# Patient Record
Sex: Male | Born: 1935 | Race: White | Hispanic: No | Marital: Married | Smoking: Former smoker
Health system: Southern US, Community
[De-identification: ages and names within clinical notes are randomized; demographics above are authoritative.]

## PROBLEM LIST (undated history)

## (undated) DIAGNOSIS — K219 Gastro-esophageal reflux disease without esophagitis: Secondary | ICD-10-CM

## (undated) DIAGNOSIS — J42 Unspecified chronic bronchitis: Secondary | ICD-10-CM

## (undated) DIAGNOSIS — E0590 Thyrotoxicosis, unspecified without thyrotoxic crisis or storm: Secondary | ICD-10-CM

## (undated) DIAGNOSIS — E119 Type 2 diabetes mellitus without complications: Secondary | ICD-10-CM

## (undated) DIAGNOSIS — I2510 Atherosclerotic heart disease of native coronary artery without angina pectoris: Secondary | ICD-10-CM

## (undated) DIAGNOSIS — K922 Gastrointestinal hemorrhage, unspecified: Secondary | ICD-10-CM

## (undated) DIAGNOSIS — N400 Enlarged prostate without lower urinary tract symptoms: Secondary | ICD-10-CM

## (undated) DIAGNOSIS — D649 Anemia, unspecified: Secondary | ICD-10-CM

## (undated) DIAGNOSIS — J209 Acute bronchitis, unspecified: Secondary | ICD-10-CM

## (undated) DIAGNOSIS — K5792 Diverticulitis of intestine, part unspecified, without perforation or abscess without bleeding: Secondary | ICD-10-CM

## (undated) DIAGNOSIS — E785 Hyperlipidemia, unspecified: Secondary | ICD-10-CM

## (undated) DIAGNOSIS — Z8611 Personal history of tuberculosis: Secondary | ICD-10-CM

## (undated) DIAGNOSIS — K5790 Diverticulosis of intestine, part unspecified, without perforation or abscess without bleeding: Secondary | ICD-10-CM

## (undated) HISTORY — PX: THYROIDECTOMY: SHX17

## (undated) HISTORY — PX: CHOLECYSTECTOMY: SHX55

## (undated) HISTORY — DX: Unspecified chronic bronchitis: J42

## (undated) HISTORY — DX: Acute bronchitis, unspecified: J20.9

## (undated) HISTORY — DX: Benign prostatic hyperplasia without lower urinary tract symptoms: N40.0

## (undated) HISTORY — DX: Hyperlipidemia, unspecified: E78.5

## (undated) HISTORY — PX: KNEE ARTHROSCOPY: SUR90

## (undated) HISTORY — DX: Personal history of tuberculosis: Z86.11

## (undated) HISTORY — DX: Atherosclerotic heart disease of native coronary artery without angina pectoris: I25.10

---

## 2004-01-07 ENCOUNTER — Emergency Department (HOSPITAL_COMMUNITY): Admission: EM | Admit: 2004-01-07 | Discharge: 2004-01-07 | Payer: Self-pay | Admitting: Emergency Medicine

## 2005-05-06 ENCOUNTER — Emergency Department (HOSPITAL_COMMUNITY): Admission: EM | Admit: 2005-05-06 | Discharge: 2005-05-06 | Payer: Self-pay | Admitting: Emergency Medicine

## 2005-08-21 ENCOUNTER — Ambulatory Visit (HOSPITAL_COMMUNITY): Admission: RE | Admit: 2005-08-21 | Discharge: 2005-08-21 | Payer: Self-pay | Admitting: Pulmonary Disease

## 2005-09-05 ENCOUNTER — Ambulatory Visit (HOSPITAL_COMMUNITY): Admission: RE | Admit: 2005-09-05 | Discharge: 2005-09-05 | Payer: Self-pay | Admitting: Pulmonary Disease

## 2005-09-12 ENCOUNTER — Ambulatory Visit (HOSPITAL_COMMUNITY): Admission: RE | Admit: 2005-09-12 | Discharge: 2005-09-12 | Payer: Self-pay | Admitting: General Surgery

## 2005-09-14 ENCOUNTER — Observation Stay (HOSPITAL_COMMUNITY): Admission: RE | Admit: 2005-09-14 | Discharge: 2005-09-28 | Payer: Self-pay | Admitting: General Surgery

## 2005-09-16 HISTORY — PX: ERCP: SHX60

## 2005-09-24 ENCOUNTER — Ambulatory Visit: Payer: Self-pay | Admitting: Internal Medicine

## 2005-11-01 ENCOUNTER — Ambulatory Visit: Payer: Self-pay | Admitting: Internal Medicine

## 2005-11-16 HISTORY — PX: ERCP: SHX60

## 2005-11-22 ENCOUNTER — Encounter (HOSPITAL_COMMUNITY): Admission: RE | Admit: 2005-11-22 | Discharge: 2005-11-22 | Payer: Self-pay | Admitting: General Surgery

## 2005-12-07 ENCOUNTER — Ambulatory Visit (HOSPITAL_COMMUNITY): Admission: RE | Admit: 2005-12-07 | Discharge: 2005-12-07 | Payer: Self-pay | Admitting: Internal Medicine

## 2005-12-07 ENCOUNTER — Ambulatory Visit: Payer: Self-pay | Admitting: Internal Medicine

## 2006-02-05 ENCOUNTER — Emergency Department (HOSPITAL_COMMUNITY): Admission: EM | Admit: 2006-02-05 | Discharge: 2006-02-05 | Payer: Self-pay | Admitting: Emergency Medicine

## 2006-05-14 ENCOUNTER — Inpatient Hospital Stay (HOSPITAL_COMMUNITY): Admission: EM | Admit: 2006-05-14 | Discharge: 2006-05-21 | Payer: Self-pay | Admitting: Emergency Medicine

## 2006-05-15 ENCOUNTER — Ambulatory Visit: Payer: Self-pay | Admitting: *Deleted

## 2006-05-17 ENCOUNTER — Ambulatory Visit: Payer: Self-pay | Admitting: *Deleted

## 2006-05-24 ENCOUNTER — Ambulatory Visit: Payer: Self-pay | Admitting: *Deleted

## 2006-05-27 ENCOUNTER — Ambulatory Visit: Payer: Self-pay | Admitting: *Deleted

## 2006-06-04 ENCOUNTER — Ambulatory Visit: Payer: Self-pay | Admitting: *Deleted

## 2006-06-06 ENCOUNTER — Ambulatory Visit: Payer: Self-pay | Admitting: Cardiology

## 2006-06-18 ENCOUNTER — Ambulatory Visit: Payer: Self-pay | Admitting: *Deleted

## 2006-07-04 ENCOUNTER — Emergency Department (HOSPITAL_COMMUNITY): Admission: EM | Admit: 2006-07-04 | Discharge: 2006-07-05 | Payer: Self-pay | Admitting: Emergency Medicine

## 2006-07-16 ENCOUNTER — Ambulatory Visit: Payer: Self-pay | Admitting: *Deleted

## 2006-08-08 ENCOUNTER — Ambulatory Visit: Payer: Self-pay | Admitting: Cardiology

## 2006-09-13 ENCOUNTER — Ambulatory Visit: Payer: Self-pay | Admitting: Internal Medicine

## 2006-10-08 ENCOUNTER — Ambulatory Visit: Payer: Self-pay | Admitting: *Deleted

## 2006-10-08 IMAGING — RF DG ERCP WO/W SPHINCTEROTOMY
1 series · 9 of 9 positions shown · non-contrast
Comparison: 09/26/2005 and 09/21/2005.   Nuclear medicine hepatobiliary scan
dated 11/22/2005.

CLINICAL DATA: Status post biliary stent placement for a post cholecystectomy
biliary leak.

ERCP WITH SPHINCTEROTOMY:

[Series 1: run · 6 acquisitions, 9 frames shown]
[im 1/6]
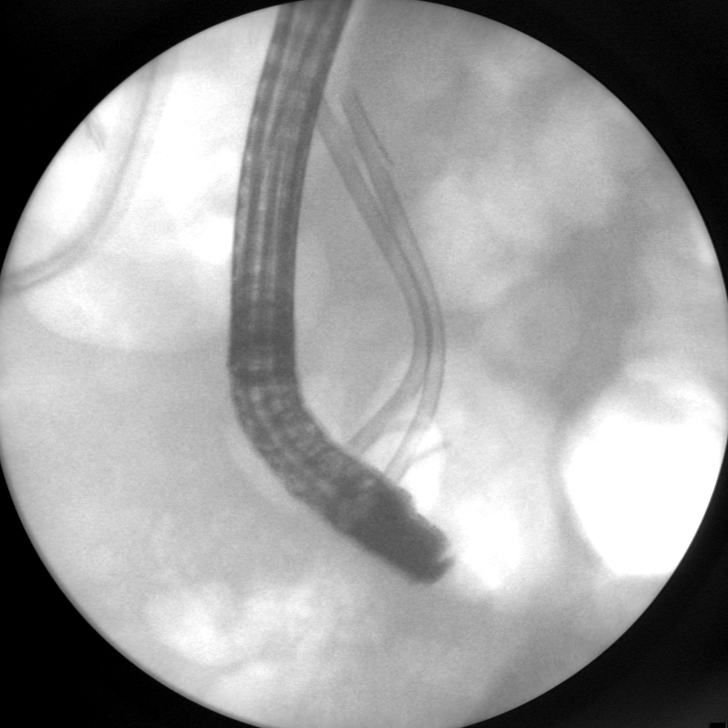
[im 2/6]
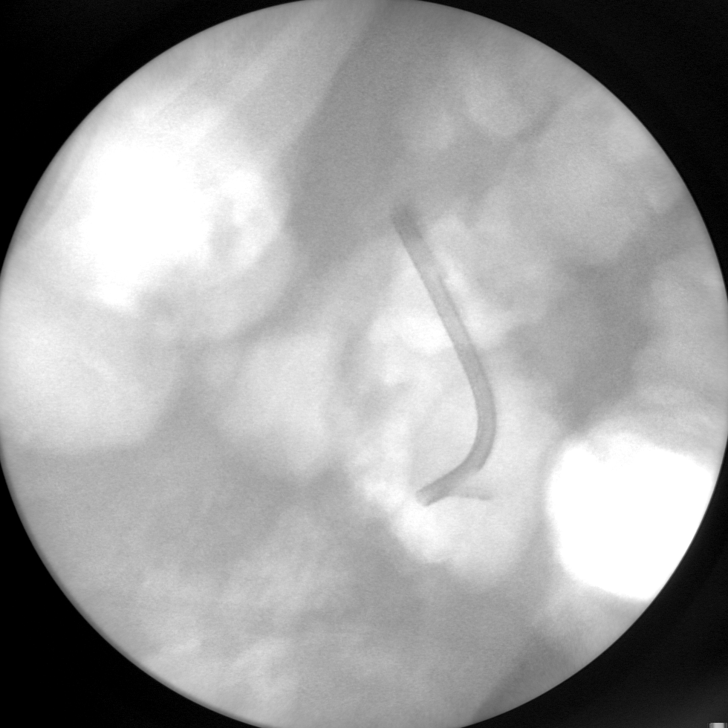
[im 3/6]
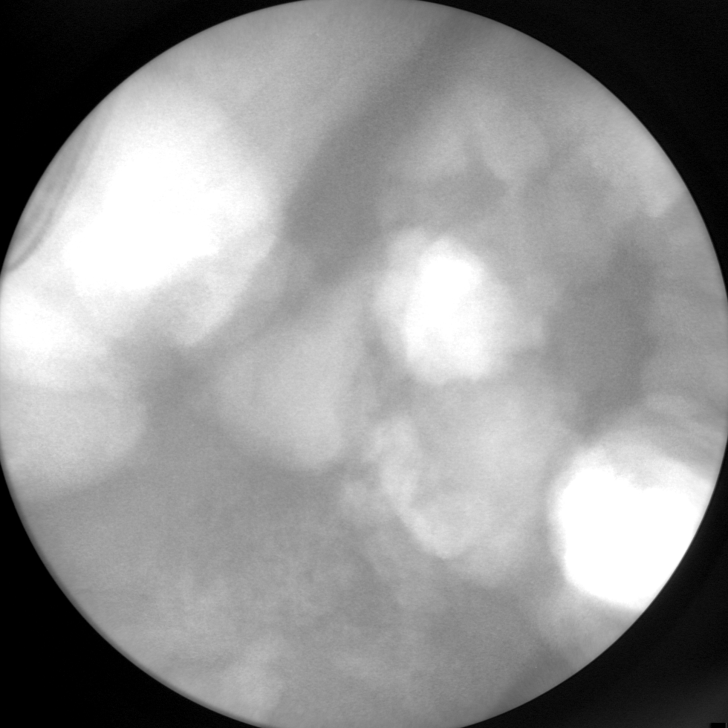
[im 4/6]
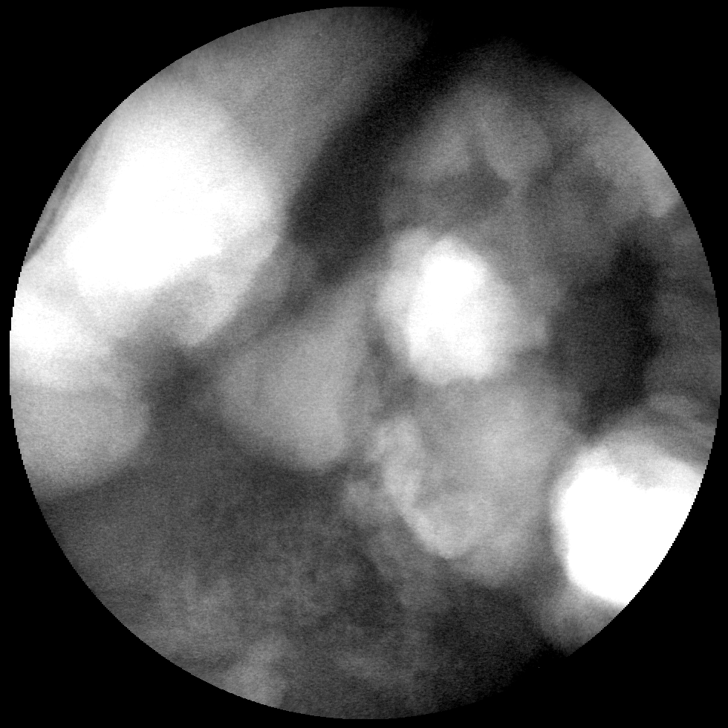
[im 4/6]
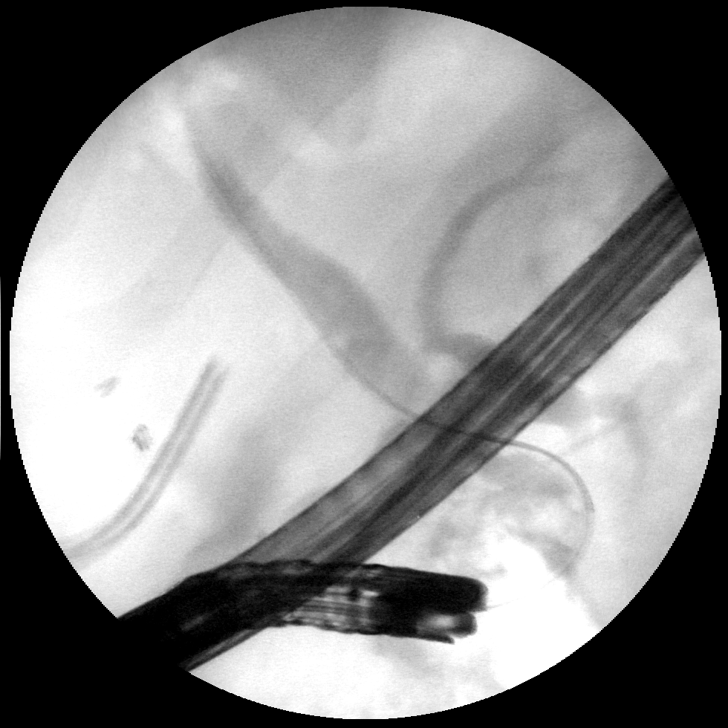
[im 4/6]
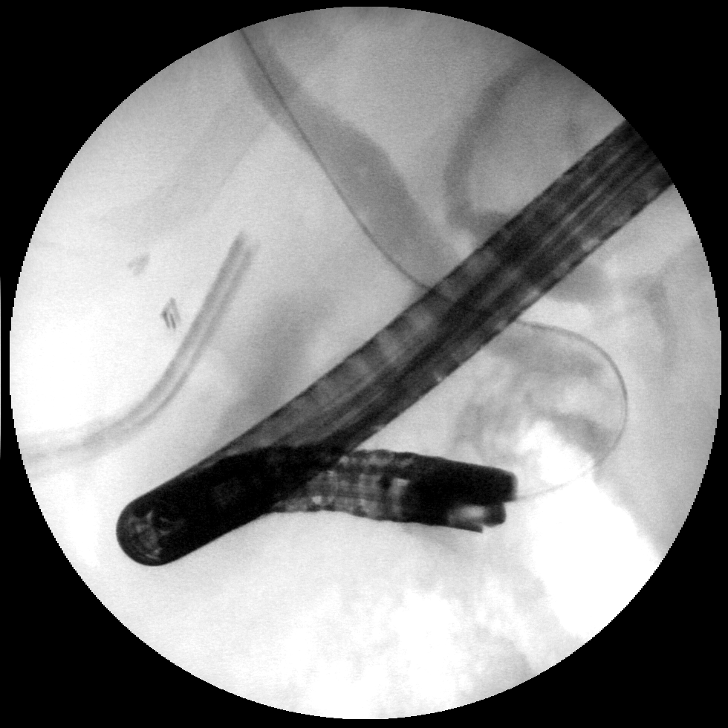
[im 4/6]
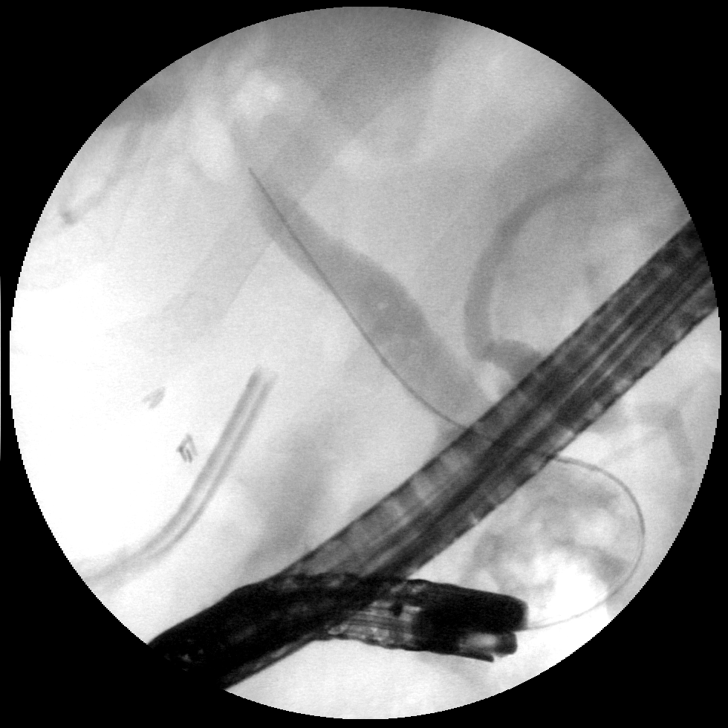
[im 5/6]
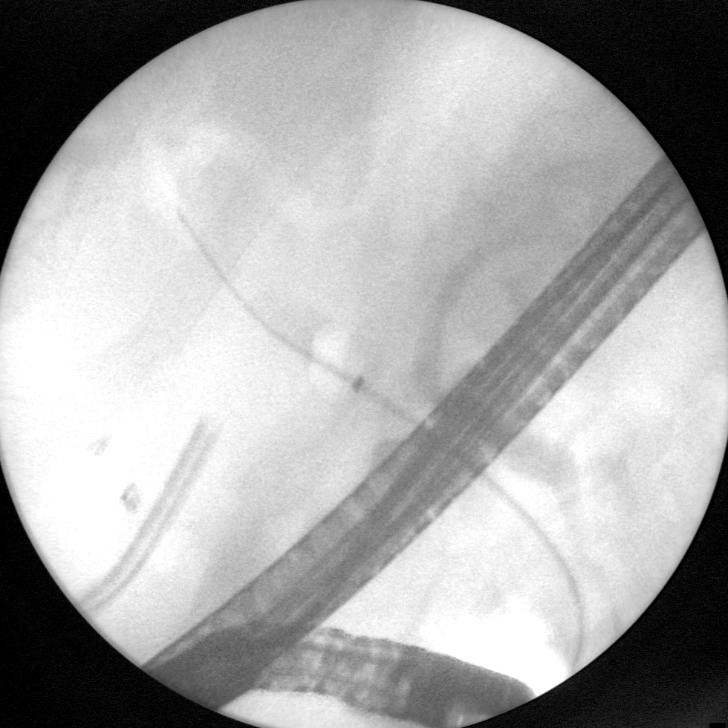
[im 6/6]
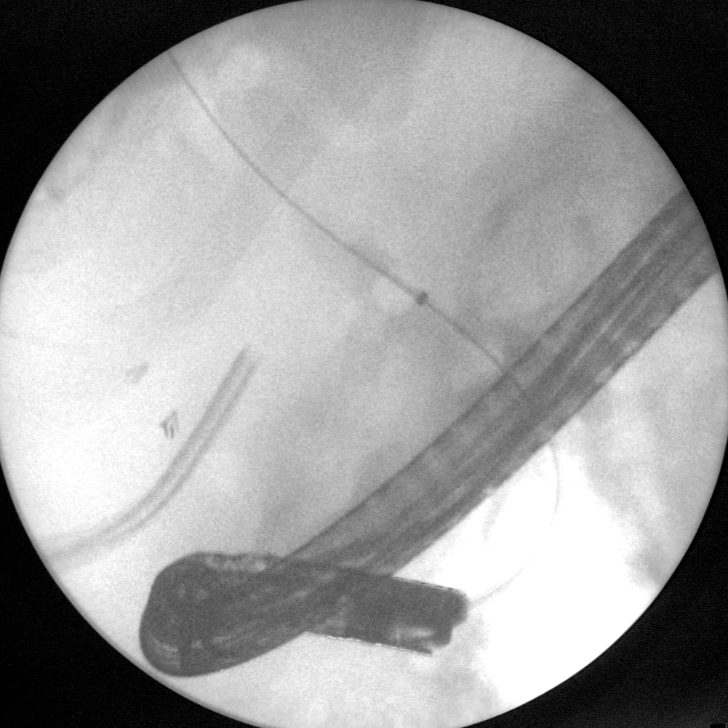

[9 of 9 positions shown; findings below may reference images not displayed]

FINDINGS: The procedure was performed by Dr. Cuchi Y Walter with no radiologist in
attendance. Sequential spot images demonstrate endoscopic removal of 2 biliary
stents. Retrograde opacification of the common duct demonstrates a mildly
dilated duct as well as some intrahepatic ductal air and contrast. The
pancreatic duct is also opacified and is mildly prominent. There is evidence of
sludge and debris in the common duct. A sphincterotomy was reportedly performed
as well as passage of a balloon through the common duct.
IMPRESSION: Biliary stent removal and sphincterotomy, as described above.

## 2006-10-30 ENCOUNTER — Ambulatory Visit: Payer: Self-pay | Admitting: Cardiovascular Disease

## 2006-11-26 ENCOUNTER — Ambulatory Visit: Payer: Self-pay | Admitting: Cardiology

## 2006-12-26 ENCOUNTER — Ambulatory Visit: Payer: Self-pay | Admitting: Cardiology

## 2007-01-30 ENCOUNTER — Ambulatory Visit: Payer: Self-pay | Admitting: Cardiology

## 2007-02-24 ENCOUNTER — Emergency Department (HOSPITAL_COMMUNITY): Admission: EM | Admit: 2007-02-24 | Discharge: 2007-02-24 | Payer: Self-pay | Admitting: Emergency Medicine

## 2007-02-28 ENCOUNTER — Ambulatory Visit: Payer: Self-pay | Admitting: Internal Medicine

## 2007-03-12 ENCOUNTER — Ambulatory Visit: Payer: Self-pay | Admitting: *Deleted

## 2007-04-22 ENCOUNTER — Ambulatory Visit: Payer: Self-pay | Admitting: Cardiovascular Disease

## 2007-04-22 ENCOUNTER — Ambulatory Visit (HOSPITAL_COMMUNITY): Admission: RE | Admit: 2007-04-22 | Discharge: 2007-04-22 | Payer: Self-pay | Admitting: Cardiology

## 2007-04-24 ENCOUNTER — Ambulatory Visit (HOSPITAL_COMMUNITY): Admission: RE | Admit: 2007-04-24 | Discharge: 2007-04-24 | Payer: Self-pay | Admitting: Cardiovascular Disease

## 2007-04-29 ENCOUNTER — Ambulatory Visit: Payer: Self-pay | Admitting: Cardiovascular Disease

## 2007-06-02 ENCOUNTER — Ambulatory Visit: Payer: Self-pay | Admitting: Cardiology

## 2007-07-07 ENCOUNTER — Ambulatory Visit: Payer: Self-pay | Admitting: Cardiology

## 2007-08-05 ENCOUNTER — Ambulatory Visit: Payer: Self-pay | Admitting: Cardiology

## 2007-08-20 ENCOUNTER — Ambulatory Visit: Payer: Self-pay | Admitting: Cardiology

## 2007-09-10 ENCOUNTER — Ambulatory Visit (HOSPITAL_COMMUNITY): Admission: RE | Admit: 2007-09-10 | Discharge: 2007-09-10 | Payer: Self-pay | Admitting: Pulmonary Disease

## 2007-09-17 ENCOUNTER — Ambulatory Visit: Payer: Self-pay | Admitting: Cardiology

## 2007-10-20 ENCOUNTER — Ambulatory Visit: Payer: Self-pay | Admitting: Cardiology

## 2007-11-03 ENCOUNTER — Emergency Department (HOSPITAL_COMMUNITY): Admission: EM | Admit: 2007-11-03 | Discharge: 2007-11-03 | Payer: Self-pay | Admitting: Emergency Medicine

## 2007-11-17 ENCOUNTER — Ambulatory Visit: Payer: Self-pay | Admitting: Internal Medicine

## 2007-12-19 ENCOUNTER — Ambulatory Visit: Payer: Self-pay | Admitting: Cardiology

## 2008-01-19 ENCOUNTER — Ambulatory Visit: Payer: Self-pay | Admitting: Cardiology

## 2008-02-17 ENCOUNTER — Ambulatory Visit: Payer: Self-pay | Admitting: Cardiology

## 2008-02-28 ENCOUNTER — Inpatient Hospital Stay (HOSPITAL_COMMUNITY): Admission: EM | Admit: 2008-02-28 | Discharge: 2008-03-02 | Payer: Self-pay | Admitting: Emergency Medicine

## 2008-03-09 ENCOUNTER — Ambulatory Visit: Payer: Self-pay | Admitting: Cardiology

## 2008-03-19 ENCOUNTER — Ambulatory Visit: Payer: Self-pay | Admitting: Cardiology

## 2008-04-29 ENCOUNTER — Ambulatory Visit: Payer: Self-pay | Admitting: Cardiovascular Disease

## 2008-05-26 ENCOUNTER — Ambulatory Visit: Payer: Self-pay | Admitting: Cardiology

## 2008-06-22 ENCOUNTER — Emergency Department (HOSPITAL_COMMUNITY): Admission: EM | Admit: 2008-06-22 | Discharge: 2008-06-22 | Payer: Self-pay | Admitting: Emergency Medicine

## 2008-06-23 ENCOUNTER — Ambulatory Visit: Payer: Self-pay | Admitting: Cardiology

## 2008-07-07 ENCOUNTER — Ambulatory Visit: Payer: Self-pay | Admitting: Cardiology

## 2008-07-29 ENCOUNTER — Ambulatory Visit: Payer: Self-pay | Admitting: Cardiology

## 2008-08-30 ENCOUNTER — Ambulatory Visit: Payer: Self-pay | Admitting: Cardiology

## 2008-09-13 ENCOUNTER — Ambulatory Visit: Payer: Self-pay | Admitting: Cardiology

## 2008-09-17 ENCOUNTER — Emergency Department (HOSPITAL_COMMUNITY): Admission: EM | Admit: 2008-09-17 | Discharge: 2008-09-17 | Payer: Self-pay | Admitting: Emergency Medicine

## 2008-10-06 ENCOUNTER — Ambulatory Visit: Payer: Self-pay | Admitting: Cardiology

## 2008-10-18 ENCOUNTER — Ambulatory Visit: Payer: Self-pay | Admitting: Cardiology

## 2008-11-08 ENCOUNTER — Ambulatory Visit: Payer: Self-pay | Admitting: Cardiology

## 2008-12-06 ENCOUNTER — Ambulatory Visit: Payer: Self-pay | Admitting: Cardiology

## 2008-12-20 ENCOUNTER — Ambulatory Visit: Payer: Self-pay | Admitting: Cardiology

## 2009-01-03 ENCOUNTER — Ambulatory Visit: Payer: Self-pay | Admitting: Cardiology

## 2009-01-24 ENCOUNTER — Ambulatory Visit: Payer: Self-pay | Admitting: Cardiology

## 2009-02-05 ENCOUNTER — Emergency Department (HOSPITAL_COMMUNITY): Admission: EM | Admit: 2009-02-05 | Discharge: 2009-02-05 | Payer: Self-pay | Admitting: Emergency Medicine

## 2009-02-21 ENCOUNTER — Ambulatory Visit (HOSPITAL_COMMUNITY): Admission: RE | Admit: 2009-02-21 | Discharge: 2009-02-21 | Payer: Self-pay | Admitting: Pulmonary Disease

## 2009-02-21 ENCOUNTER — Ambulatory Visit: Payer: Self-pay | Admitting: Cardiology

## 2009-03-10 ENCOUNTER — Ambulatory Visit: Payer: Self-pay | Admitting: Cardiology

## 2009-03-10 ENCOUNTER — Inpatient Hospital Stay (HOSPITAL_COMMUNITY): Admission: EM | Admit: 2009-03-10 | Discharge: 2009-03-13 | Payer: Self-pay | Admitting: Emergency Medicine

## 2009-04-14 ENCOUNTER — Ambulatory Visit: Payer: Self-pay | Admitting: Cardiology

## 2009-05-12 ENCOUNTER — Ambulatory Visit: Payer: Self-pay | Admitting: Cardiology

## 2009-05-30 ENCOUNTER — Ambulatory Visit: Payer: Self-pay | Admitting: Cardiology

## 2009-06-09 ENCOUNTER — Ambulatory Visit: Payer: Self-pay | Admitting: Cardiology

## 2009-07-04 ENCOUNTER — Ambulatory Visit: Payer: Self-pay | Admitting: Cardiology

## 2009-07-05 ENCOUNTER — Inpatient Hospital Stay (HOSPITAL_COMMUNITY): Admission: AD | Admit: 2009-07-05 | Discharge: 2009-07-07 | Payer: Self-pay | Admitting: Pulmonary Disease

## 2009-07-18 ENCOUNTER — Ambulatory Visit: Payer: Self-pay

## 2009-07-27 ENCOUNTER — Encounter: Payer: Self-pay | Admitting: Cardiology

## 2009-07-28 ENCOUNTER — Ambulatory Visit: Payer: Self-pay

## 2009-07-28 ENCOUNTER — Ambulatory Visit (HOSPITAL_COMMUNITY): Admission: RE | Admit: 2009-07-28 | Discharge: 2009-07-28 | Payer: Self-pay | Admitting: Pulmonary Disease

## 2009-08-01 ENCOUNTER — Encounter: Payer: Self-pay | Admitting: *Deleted

## 2009-08-01 ENCOUNTER — Telehealth (INDEPENDENT_AMBULATORY_CARE_PROVIDER_SITE_OTHER): Payer: Self-pay | Admitting: *Deleted

## 2009-08-08 ENCOUNTER — Ambulatory Visit: Payer: Self-pay

## 2009-08-24 ENCOUNTER — Ambulatory Visit: Payer: Self-pay | Admitting: Cardiology

## 2009-09-22 ENCOUNTER — Ambulatory Visit: Payer: Self-pay | Admitting: Cardiology

## 2009-09-22 LAB — CONVERTED CEMR LAB: POC INR: 2.8

## 2009-09-26 ENCOUNTER — Ambulatory Visit: Payer: Self-pay | Admitting: Cardiology

## 2009-09-26 LAB — CONVERTED CEMR LAB: POC INR: 3.1

## 2009-10-10 ENCOUNTER — Ambulatory Visit: Payer: Self-pay | Admitting: Cardiology

## 2009-10-21 ENCOUNTER — Telehealth (INDEPENDENT_AMBULATORY_CARE_PROVIDER_SITE_OTHER): Payer: Self-pay | Admitting: *Deleted

## 2009-10-27 ENCOUNTER — Ambulatory Visit: Payer: Self-pay | Admitting: Cardiology

## 2009-10-27 LAB — CONVERTED CEMR LAB: POC INR: 3.2

## 2009-11-07 ENCOUNTER — Ambulatory Visit: Payer: Self-pay | Admitting: Cardiology

## 2009-11-07 LAB — CONVERTED CEMR LAB: POC INR: 2.3

## 2009-11-14 ENCOUNTER — Telehealth (INDEPENDENT_AMBULATORY_CARE_PROVIDER_SITE_OTHER): Payer: Self-pay | Admitting: *Deleted

## 2009-11-23 ENCOUNTER — Ambulatory Visit: Payer: Self-pay | Admitting: Cardiovascular Disease

## 2009-12-05 ENCOUNTER — Ambulatory Visit: Payer: Self-pay | Admitting: Cardiology

## 2009-12-15 ENCOUNTER — Telehealth (INDEPENDENT_AMBULATORY_CARE_PROVIDER_SITE_OTHER): Payer: Self-pay | Admitting: *Deleted

## 2009-12-26 ENCOUNTER — Ambulatory Visit: Payer: Self-pay | Admitting: Cardiology

## 2010-01-12 ENCOUNTER — Telehealth (INDEPENDENT_AMBULATORY_CARE_PROVIDER_SITE_OTHER): Payer: Self-pay | Admitting: *Deleted

## 2010-01-18 ENCOUNTER — Ambulatory Visit: Payer: Self-pay | Admitting: Cardiology

## 2010-01-18 LAB — CONVERTED CEMR LAB: POC INR: 2.5

## 2010-02-06 ENCOUNTER — Telehealth (INDEPENDENT_AMBULATORY_CARE_PROVIDER_SITE_OTHER): Payer: Self-pay | Admitting: *Deleted

## 2010-02-08 ENCOUNTER — Ambulatory Visit: Payer: Self-pay | Admitting: Cardiology

## 2010-02-16 ENCOUNTER — Ambulatory Visit: Payer: Self-pay | Admitting: Cardiology

## 2010-02-21 ENCOUNTER — Telehealth (INDEPENDENT_AMBULATORY_CARE_PROVIDER_SITE_OTHER): Payer: Self-pay | Admitting: *Deleted

## 2010-02-27 ENCOUNTER — Ambulatory Visit: Payer: Self-pay | Admitting: Cardiology

## 2010-02-27 LAB — CONVERTED CEMR LAB: POC INR: 3.7

## 2010-03-20 ENCOUNTER — Ambulatory Visit: Payer: Self-pay | Admitting: Cardiology

## 2010-03-20 LAB — CONVERTED CEMR LAB: POC INR: 1.8

## 2010-04-13 ENCOUNTER — Ambulatory Visit: Payer: Self-pay | Admitting: Cardiology

## 2010-04-13 LAB — CONVERTED CEMR LAB: POC INR: 2.4

## 2010-05-10 ENCOUNTER — Ambulatory Visit: Payer: Self-pay | Admitting: Cardiology

## 2010-05-29 ENCOUNTER — Ambulatory Visit: Payer: Self-pay | Admitting: Cardiology

## 2010-06-26 ENCOUNTER — Ambulatory Visit: Payer: Self-pay | Admitting: Cardiology

## 2010-07-26 ENCOUNTER — Ambulatory Visit: Payer: Self-pay | Admitting: Cardiology

## 2010-08-23 ENCOUNTER — Ambulatory Visit: Payer: Self-pay | Admitting: Cardiology

## 2010-08-30 ENCOUNTER — Telehealth (INDEPENDENT_AMBULATORY_CARE_PROVIDER_SITE_OTHER): Payer: Self-pay

## 2010-09-13 ENCOUNTER — Ambulatory Visit: Payer: Self-pay | Admitting: Cardiovascular Disease

## 2010-09-13 DIAGNOSIS — R0609 Other forms of dyspnea: Secondary | ICD-10-CM | POA: Insufficient documentation

## 2010-09-13 DIAGNOSIS — I2510 Atherosclerotic heart disease of native coronary artery without angina pectoris: Secondary | ICD-10-CM

## 2010-09-13 DIAGNOSIS — R0602 Shortness of breath: Secondary | ICD-10-CM

## 2010-09-13 DIAGNOSIS — I10 Essential (primary) hypertension: Secondary | ICD-10-CM | POA: Insufficient documentation

## 2010-09-13 DIAGNOSIS — E782 Mixed hyperlipidemia: Secondary | ICD-10-CM

## 2010-09-13 DIAGNOSIS — I4891 Unspecified atrial fibrillation: Secondary | ICD-10-CM

## 2010-09-13 DIAGNOSIS — B000 Eczema herpeticum: Secondary | ICD-10-CM | POA: Insufficient documentation

## 2010-09-20 ENCOUNTER — Ambulatory Visit: Payer: Self-pay | Admitting: Cardiology

## 2010-09-20 LAB — CONVERTED CEMR LAB: POC INR: 2.4

## 2010-09-22 ENCOUNTER — Telehealth (INDEPENDENT_AMBULATORY_CARE_PROVIDER_SITE_OTHER): Payer: Self-pay | Admitting: *Deleted

## 2010-09-27 ENCOUNTER — Ambulatory Visit: Payer: Self-pay | Admitting: Cardiology

## 2010-09-27 LAB — CONVERTED CEMR LAB: POC INR: 2.8

## 2010-10-06 ENCOUNTER — Telehealth (INDEPENDENT_AMBULATORY_CARE_PROVIDER_SITE_OTHER): Payer: Self-pay | Admitting: *Deleted

## 2010-10-11 ENCOUNTER — Ambulatory Visit: Payer: Self-pay | Admitting: Cardiology

## 2010-10-18 ENCOUNTER — Ambulatory Visit: Payer: Self-pay | Admitting: Cardiology

## 2010-10-18 LAB — CONVERTED CEMR LAB: POC INR: 2.4

## 2010-11-03 ENCOUNTER — Telehealth (INDEPENDENT_AMBULATORY_CARE_PROVIDER_SITE_OTHER): Payer: Self-pay | Admitting: *Deleted

## 2010-11-06 ENCOUNTER — Ambulatory Visit: Payer: Self-pay | Admitting: Cardiology

## 2010-11-06 LAB — CONVERTED CEMR LAB: POC INR: 2.6

## 2010-11-15 ENCOUNTER — Ambulatory Visit: Payer: Self-pay | Admitting: Cardiology

## 2010-12-13 ENCOUNTER — Ambulatory Visit: Payer: Self-pay | Admitting: Cardiology

## 2011-01-10 ENCOUNTER — Ambulatory Visit: Admission: RE | Admit: 2011-01-10 | Discharge: 2011-01-10 | Payer: Self-pay | Source: Home / Self Care

## 2011-01-16 NOTE — Assessment & Plan Note (Signed)
Summary: EC6 LAST SEEN 2008/TMJ   Visit Type:  Follow-up Primary Provider:  Maryan [name]   History of Present Illness: Caleb [name] is seen today for the first time since 2008.  He is on chronic coumadin for PAF.  His INR's have been ok.  He is followed in our coumadin clinic  His primary is Caleb [name].  He quit smoking in 99 and has apparantly had TB with mild chronic dyspnea.  CRF's modified include HTN, DM and elevated lipids.  Compliant with meds.  Has horrible psoriasis and needs to get more "lotion" from [place].  Warned him that if he lets this go he can get bad cellulitis or sepsis Denies SSCP, palpitations, edema or syncope  Preventive Screening-Counseling & Management  Alcohol-Tobacco     Smoking Status: quit     Year Quit: 1999  Current Problems (verified): 1)  Cad  (ICD-414.00)  Current Medications (verified): 1)  Nitroglycerin 0.4 Mg Subl (Nitroglycerin) .... Place 1 Tablet Under Tongue As Directed 2)  Warfarin Sodium 5 Mg Tabs (Warfarin Sodium) .... Take 1 1/2 Tablet By Mouth Every Night 3)  Prednisone 10 Mg Tabs (Prednisone) .... Take 1 Tablet By Mouth Once A Day 4)  Aciphex 20 Mg Tbec (Rabeprazole Sodium) .... Take 1 Tablet By Mouth Once A Day 5)  Etodolac 400 Mg Tabs (Etodolac) .... Take 1 Tablet By Mouth Once A Day 6)  Lisinopril 10 Mg Tabs (Lisinopril) .... Take 1 Tablet By Mouth Once A Day 7)  Metformin Hcl 500 Mg Tabs (Metformin Hcl) .... Take 1 Tablet By Mouth Two Times A Day 8)  Metoclopramide Hcl 10 Mg Tabs (Metoclopramide Hcl) .... Take 1 Tablet By Mouth Once A Day At Bedtime 9)  Oxybutynin Chloride 5 Mg Tabs (Oxybutynin Chloride) .... Take 1 Tablet By Mouth Once A Day 10)  Promethazine Hcl 25 Mg Tabs (Promethazine Hcl) .... As Needed 11)  Senokot 8.6 Mg Tabs (Sennosides) .... Take 1 Tablet By Mouth Once A Day 12)  Synthroid 300 Mcg Tabs (Levothyroxine Sodium) .... Take 1 Tablet By Mouth Once A Day 13)  Tylox 5-500 Mg Caps (Oxycodone-Acetaminophen) .... As Needed 14)   Vitamin C 500 Mg Tabs (Ascorbic Acid) .... Take 1 Tablet By Mouth Once A Day 15)  Niacin Cr 750 Mg Cr-Tabs (Niacin) .... Take 1 Tablet By Mouth Once A Day At Bedtime 16)  Proscar 5 Mg Tabs (Finasteride) .... Take 1 Tablet By Mouth Once A Day 17)  Simvastatin 20 Mg Tabs (Simvastatin) .... Take 1 Tablet By Mouth Once A Day 18)  Histinex Hc .... 2 Tsp By Mouth As Needed 19)  Refresh P.m.  Oint (Artificial Tear Ointment) .... Apply Topically At Bedtime 20)  Refresh Tears 0.5 % Soln (Carboxymethylcellulose Sodium) .... One Drop Ou Four Times A Day 21)  Diltiazem Hcl Cr 240 Mg Xr24h-Cap (Diltiazem Hcl) .... Take 1 Tablet By Mouth Once A Day  Allergies (verified): No Known Drug Allergies  Comments:  Nurse/Medical Assistant: The patient's medication list and allergies were reviewed with the patient and were updated in the Medication and Allergy Lists.  Past History:  Past Medical History: Last updated: 09-14-10 acute exacerbation of chronic bronchitis history of tuberculosis coronary artery disease history of pulmonary emboli hyperlipidemia benign prostatic hypertrophy  Past Surgical History: Last updated: Sep 14, 2010 cath 2007 cholecystectomy  Family History: Last updated: 2010/09/14 Father:deceased due to myocardial infarction Mother:deceased sudden Siblings:sister deceased due to stomach cancer  Social History: Last updated: 14-Sep-2010 Retired  Married  Tobacco Use -  Former.  Alcohol Use - no Regular Exercise - no Drug Use - no  Review of Systems       Denies fever, malais, weight loss, blurry vision, decreased visual acuity, cough, sputum, hemoptysis, pleuritic pain, palpitaitons, heartburn, abdominal pain, melena, lower extremity edema, claudication, or rash.   Vital Signs:  Patient profile:   76 year old male Height:      72 inches Weight:      213 pounds BMI:     28.99 Pulse rate:   78 / minute BP sitting:   120 / 73  (left arm) Cuff size:    large  Vitals Entered By: Carlye Grippe (September 13, 2010 10:31 AM)  Nutrition Counseling: Patient's BMI is greater than 25 and therefore counseled on weight management options.  Physical Exam  General:  Affect appropriate Healthy:  appears stated age HEENT: normal Neck supple with no adenopathy JVP normal no bruits no thyromegaly Lungs clear with no wheezing and good diaphragmatic motion Heart:  S1/S2 no murmur,rub, gallop or click PMI normal Abdomen: benighn, BS positve, no tenderness, no AAA no bruit.  No HSM or HJR Distal pulses intact with no bruits No edema Neuro non-focal Skin  with marked psoriatic changes on chest and arms    Impression & Recommendations:  Problem # 1:  CAD (ICD-414.00) Stable no angina.  Normal ECG His updated medication list for this problem includes:    Nitroglycerin 0.4 Mg Subl (Nitroglycerin) .Marland Kitchen... Place 1 tablet under tongue as directed    Warfarin Sodium 5 Mg Tabs (Warfarin sodium) .Marland Kitchen... Take 1 1/2 tablet by mouth every night    Lisinopril 10 Mg Tabs (Lisinopril) .Marland Kitchen... Take 1 tablet by mouth once a day    Diltiazem Hcl Cr 240 Mg Xr24h-cap (Diltiazem hcl) .Marland Kitchen... Take 1 tablet by mouth once a day  Orders: EKG w/ Interpretation (93000)  Problem # 2:  SHORTNESS OF BREATH (ICD-786.05) History of normal LV.  TB and COPD  F/.U [place].   His updated medication list for this problem includes:    Lisinopril 10 Mg Tabs (Lisinopril) .Marland Kitchen... Take 1 tablet by mouth once a day    Diltiazem Hcl Cr 240 Mg Xr24h-cap (Diltiazem hcl) .Marland Kitchen... Take 1 tablet by mouth once a day  Problem # 3:  MIXED HYPERLIPIDEMIA (ICD-272.2) Continue current Rx labs per primary His updated medication list for this problem includes:    Niacin Cr 750 Mg Cr-tabs (Niacin) .Marland Kitchen... Take 1 tablet by mouth once a day at bedtime    Simvastatin 20 Mg Tabs (Simvastatin) .Marland Kitchen... Take 1 tablet by mouth once a day  Problem # 4:  ESSENTIAL HYPERTENSION, BENIGN (ICD-401.1) Well controlled.   Low sodium diet His updated medication list for this problem includes:    Lisinopril 10 Mg Tabs (Lisinopril) .Marland Kitchen... Take 1 tablet by mouth once a day    Diltiazem Hcl Cr 240 Mg Xr24h-cap (Diltiazem hcl) .Marland Kitchen... Take 1 tablet by mouth once a day  Problem # 5:  ECZEMA HERPETICUM (ICD-054.0) Worrisome for skin breakdown.  He will go to Dr Sheliah Hatch office today to refill skin medicine ? cortisone type cream. Also can get meds from [place].    Problem # 6:  ATRIAL FIBRILLATION (ICD-427.31) In NSR with no symptoms  Continue anticoagulaiton His updated medication list for this problem includes:    Warfarin Sodium 5 Mg Tabs (Warfarin sodium) .Marland Kitchen... Take 1 1/2 tablet by mouth every night  Patient Instructions: 1)  Your physician recommends that you schedule a follow-up  appointment in: 1 year 2)  Your physician recommends that you continue on your current medications as directed. Please refer to the Current Medication list given to you today.   EKG Report  Procedure date:  09/13/2010  Findings:      NSR  Normal ECG

## 2011-01-16 NOTE — Medication Information (Signed)
Summary: ccr-lr  Anticoagulant Therapy  Managed by: Vashti Hey, RN Supervising MD: Diona Browner MD, Remi Deter Indication 1: Atrial Fibrillation (ICD-427.31) Lab Used: [facility] [place] Site: [place] INR POC 2.8  Dietary changes: no    Health status changes: no    Bleeding/hemorrhagic complications: no    Recent/future hospitalizations: no    Any changes in medication regimen? no    Recent/future dental: no  Any missed doses?: no       Is patient compliant with meds? yes       Allergies: No Known Drug Allergies  Anticoagulation Management History:      The patient is taking warfarin and comes in today for a routine follow up visit.  Positive risk factors for bleeding include an age of 46 years or older.  The bleeding index is 'intermediate risk'.  Negative CHADS2 values include Age > 37 years old.  The start date was 05/21/2006.  Anticoagulation responsible provider: Diona Browner MD, Remi Deter.  INR POC: 2.8.  Cuvette Lot#: [id number].  Exp: 10/11.    Anticoagulation Management Assessment/Plan:      The patient's current anticoagulation dose is Warfarin sodium 5 mg tabs: Take 1 1/2 tablet by mouth every night.  The target INR is 2 - 3.  The next INR is due 08/23/2010.  Anticoagulation instructions were given to patient.  Results were reviewed/authorized by Vashti Hey, RN.  He was notified by Vashti Hey RN.         Prior Anticoagulation Instructions: INR 3.2 Take coumadin 1/2 tablet tonight then resume 1 tablet once daily except 1/2 tablet on Tuesdays, Thursdays and Saturdays  Current Anticoagulation Instructions: INR 2.8 Continue coumadin 5mg  once daily except 2.5mg  on Tuesdays, Thursdays and Saturdays

## 2011-01-16 NOTE — Medication Information (Signed)
Summary: ccr-lr  Anticoagulant Therapy  Managed by: Vashti Hey, RN Supervising MD: Daleen Squibb MD, Maisie Fus Indication 1: Atrial Fibrillation (ICD-427.31) Lab Used: [facility] [place] Site: [place] INR POC 2.1  Dietary changes: no    Health status changes: no    Bleeding/hemorrhagic complications: no    Recent/future hospitalizations: no    Any changes in medication regimen? yes       Details: has finished ABX  Recent/future dental: no  Any missed doses?: no       Is patient compliant with meds? yes       Allergies: No Known Drug Allergies  Anticoagulation Management History:      The patient is taking warfarin and comes in today for a routine follow up visit.  Positive risk factors for bleeding include an age of 74 years or older.  The bleeding index is 'intermediate risk'.  Negative CHADS2 values include Age > 54 years old.  The start date was 05/21/2006.  Anticoagulation responsible provider: Daleen Squibb MD, Maisie Fus.  INR POC: 2.1.  Cuvette Lot#: [id number].  Exp: 10/11.    Anticoagulation Management Assessment/Plan:      The patient's current anticoagulation dose is Warfarin sodium 5 mg tabs: Take 1 1/2 tablet by mouth every night.  The target INR is 2 - 3.  The next INR is due 01/23/2010.  Anticoagulation instructions were given to patient.  Results were reviewed/authorized by Vashti Hey, RN.  He was notified by Vashti Hey RN.         Prior Anticoagulation Instructions: INR 1.9 Take coumadin 1 1/2 tonight then resume 1 tablet once daily except 1/2 tablet on Tuesdays, Thursdays and Saturdays  Current Anticoagulation Instructions: INR 2.1 Continue coumadin 5mg  once daily except 2.5mg  on Tuesdays, Thursdays and Saturdays

## 2011-01-16 NOTE — Progress Notes (Signed)
Summary: pt waiting to start on anticbiotics till he hears from nurse  Phone Note Call from Patient Call back at Home Phone [phone]   Caller: pt Reason for Call: Talk to Nurse Summary of Call: pt was told to talk to lisa before starting these meds metronidazole and cipro. He just got them today and has not started on them yet. Initial call taken by: Faythe Ghee,  October 06, 2010 3:51 PM  Follow-up for Phone Call        OK to start.  Will check INR 10/11/10. Follow-up by: Vashti Hey RN,  October 06, 2010 4:11 PM

## 2011-01-16 NOTE — Medication Information (Signed)
Summary: ccr-lr  Anticoagulant Therapy  Managed by: Vashti Hey, RN PCP: [name] Supervising MD: Dietrich Pates MD, Molly Maduro Indication 1: Atrial Fibrillation (ICD-427.31) Lab Used: [facility] [place] Site: [place] INR POC 2.4  Dietary changes: no    Health status changes: no    Bleeding/hemorrhagic complications: no    Recent/future hospitalizations: no    Any changes in medication regimen? yes       Details: Finished Abx  Recent/future dental: no  Any missed doses?: no       Is patient compliant with meds? yes       Allergies: No Known Drug Allergies  Anticoagulation Management History:      The patient is taking warfarin and comes in today for a routine follow up visit.  Positive risk factors for bleeding include an age of 74 years or older.  The bleeding index is 'intermediate risk'.  Positive CHADS2 values include History of HTN.  Negative CHADS2 values include Age > 74 years old.  The start date was 05/21/2006.  Anticoagulation responsible provider: Dietrich Pates MD, Molly Maduro.  INR POC: 2.4.  Cuvette Lot#: [id number].  Exp: 10/11.    Anticoagulation Management Assessment/Plan:      The patient's current anticoagulation dose is Warfarin sodium 5 mg tabs: Take 1 1/2 tablet by mouth every night.  The target INR is 2 - 3.  The next INR is due 11/15/2010.  Anticoagulation instructions were given to patient.  Results were reviewed/authorized by Vashti Hey, RN.  He was notified by Vashti Hey RN.         Prior Anticoagulation Instructions: INR 1.9 Continue coumadin 5mg  once daily except 2.5mg  on Tuesdays, Thursdays and Saturdays Pt will finish Abx on Fri. 10/13/10.  Current Anticoagulation Instructions: INR 2.4 Continue coumadin 5mg  once daily except 2.5mg  on Tuesdays, Thursdays and Saturdays

## 2011-01-16 NOTE — Medication Information (Signed)
Summary: ccr-lr  Anticoagulant Therapy  Managed by: Vashti Hey, RN Supervising MD: Dietrich Pates MD, Molly Maduro Indication 1: Atrial Fibrillation (ICD-427.31) Lab Used: [facility] [place] Site: [place] INR POC 3.2  Dietary changes: no    Health status changes: no    Bleeding/hemorrhagic complications: no    Recent/future hospitalizations: no    Any changes in medication regimen? no    Recent/future dental: no  Any missed doses?: no       Is patient compliant with meds? yes       Allergies: No Known Drug Allergies  Anticoagulation Management History:      The patient is taking warfarin and comes in today for a routine follow up visit.  Positive risk factors for bleeding include an age of 74 years or older.  The bleeding index is 'intermediate risk'.  Negative CHADS2 values include Age > 68 years old.  The start date was 05/21/2006.  Anticoagulation responsible provider: Dietrich Pates MD, Molly Maduro.  INR POC: 3.2.  Cuvette Lot#: [id number].  Exp: 10/11.    Anticoagulation Management Assessment/Plan:      The patient's current anticoagulation dose is Warfarin sodium 5 mg tabs: Take 1 1/2 tablet by mouth every night.  The target INR is 2 - 3.  The next INR is due 07/26/2010.  Anticoagulation instructions were given to patient.  Results were reviewed/authorized by Vashti Hey, RN.  He was notified by Vashti Hey RN.         Prior Anticoagulation Instructions: INR 2.7 Continue coumadin 5mg  once daily except 2.5mg  on Tuesdays, Thursdays and Saturdays  Current Anticoagulation Instructions: INR 3.2 Take coumadin 1/2 tablet tonight then resume 1 tablet once daily except 1/2 tablet on Tuesdays, Thursdays and Saturdays

## 2011-01-16 NOTE — Medication Information (Signed)
Summary: ccr-lr  Anticoagulant Therapy  Managed by: Vashti Hey, RN Supervising MD: Dietrich Pates MD, Molly Maduro Indication 1: Atrial Fibrillation (ICD-427.31) Lab Used: [facility] [place] Site: [place] INR POC 2.5  Dietary changes: no    Health status changes: no    Bleeding/hemorrhagic complications: no    Recent/future hospitalizations: no    Any changes in medication regimen? yes       Details: been on abx x 5 days   has 5 days left  Recent/future dental: no  Any missed doses?: no       Is patient compliant with meds? yes       Allergies: No Known Drug Allergies  Anticoagulation Management History:      The patient is taking warfarin and comes in today for a routine follow up visit.  Positive risk factors for bleeding include an age of 74 years or older.  The bleeding index is 'intermediate risk'.  Negative CHADS2 values include Age > 29 years old.  The start date was 05/21/2006.  Anticoagulation responsible provider: Dietrich Pates MD, Molly Maduro.  INR POC: 2.5.  Cuvette Lot#: [id number].  Exp: 10/11.    Anticoagulation Management Assessment/Plan:      The patient's current anticoagulation dose is Warfarin sodium 5 mg tabs: Take 1 1/2 tablet by mouth every night.  The target INR is 2 - 3.  The next INR is due 02/16/2010.  Anticoagulation instructions were given to patient.  Results were reviewed/authorized by Vashti Hey, RN.  He was notified by Vashti Hey RN.         Prior Anticoagulation Instructions: INR 2.1 Continue coumadin 5mg  once daily except 2.5mg  on Tuesdays, Thursdays and Saturdays  Current Anticoagulation Instructions: INR 2.5 Continue coumadin 5mg  once daily except 2.5mg  on Tuesdays, Thursdays and Saturdays

## 2011-01-16 NOTE — Progress Notes (Signed)
Summary: On Antibiotics  Phone Note Call from Patient   Caller: Patient Reason for Call: Talk to Nurse Summary of Call: patient wanted to let you know he had been started on 2 antibiotics / Cefuroxine 5mg  and Eredmisone 10mg  for the next 10 days/tg Initial call taken by: Raechel Ache [facility],  September 22, 2010 3:34 PM  Follow-up for Phone Call        Spoke with pt.  Appt made for INR check on 09/27/10 due to abx. Pt in agreement. Follow-up by: Vashti Hey RN,  September 22, 2010 4:13 PM

## 2011-01-16 NOTE — Progress Notes (Signed)
Summary: Antibiotics  Phone Note Call from Patient   Caller: Patient Reason for Call: Talk to Nurse Summary of Call: patient states that he has been put on Prednisone 10mg  and Cefuroxineaxetil 500mg  / tg Initial call taken by: Raechel Ache [facility],  November 03, 2010 12:00 PM  Follow-up for Phone Call        Called pt.  He is on a prednisone taper and abx for bronchitis.  Appt made to check INR on 11/06/10.  Pt in agreement. Follow-up by: Vashti Hey RN,  November 03, 2010 3:05 PM

## 2011-01-16 NOTE — Medication Information (Signed)
Summary: PROTIME/TG  Anticoagulant Therapy  Managed by: Vashti Hey, RN Supervising MD: Dietrich Pates MD, Molly Maduro Indication 1: Atrial Fibrillation (ICD-427.31) Lab Used: [facility] [place] Site: [place] INR POC 2.4  Dietary changes: no    Health status changes: no    Bleeding/hemorrhagic complications: no    Recent/future hospitalizations: no    Any changes in medication regimen? no    Recent/future dental: no  Any missed doses?: no       Is patient compliant with meds? yes       Allergies: No Known Drug Allergies  Anticoagulation Management History:      The patient is taking warfarin and comes in today for a routine follow up visit.  Positive risk factors for bleeding include an age of 74 years or older.  The bleeding index is 'intermediate risk'.  Negative CHADS2 values include Age > 65 years old.  The start date was 05/21/2006.  Anticoagulation responsible provider: Dietrich Pates MD, Molly Maduro.  INR POC: 2.4.  Cuvette Lot#: [id number].  Exp: 10/11.    Anticoagulation Management Assessment/Plan:      The patient's current anticoagulation dose is Warfarin sodium 5 mg tabs: Take 1 1/2 tablet by mouth every night.  The target INR is 2 - 3.  The next INR is due 09/20/2010.  Anticoagulation instructions were given to patient.  Results were reviewed/authorized by Vashti Hey, RN.  He was notified by Vashti Hey RN.         Prior Anticoagulation Instructions: INR 2.8 Continue coumadin 5mg  once daily except 2.5mg  on Tuesdays, Thursdays and Saturdays  Current Anticoagulation Instructions: INR 2.4 Continue coumadin 5mg  once daily except 2.5mg  on T,Th,Sat

## 2011-01-16 NOTE — Medication Information (Signed)
Summary: ccr-lr  Anticoagulant Therapy  Managed by: Vashti Hey, RN PCP: [name] Supervising MD: Dietrich Pates MD, Molly Maduro Indication 1: Atrial Fibrillation (ICD-427.31) Lab Used: [facility] [place] Site:  INR POC 1.9  Dietary changes: no    Health status changes: no    Bleeding/hemorrhagic complications: no    Recent/future hospitalizations: no    Any changes in medication regimen? yes       Details: started on metronidazide and cipro for ulcerative colitis  Recent/future dental: no  Any missed doses?: no       Is patient compliant with meds? yes       Allergies: No Known Drug Allergies  Anticoagulation Management History:      The patient is taking warfarin and comes in today for a routine follow up visit.  Positive risk factors for bleeding include an age of 74 years or older.  The bleeding index is 'intermediate risk'.  Positive CHADS2 values include History of HTN.  Negative CHADS2 values include Age > 40 years old.  The start date was 05/21/2006.  Anticoagulation responsible Abria Vannostrand: Dietrich Pates MD, Molly Maduro.  INR POC: 1.9.  Cuvette Lot#: [id number].  Exp: 10/11.    Anticoagulation Management Assessment/Plan:      The patient's current anticoagulation dose is Warfarin sodium 5 mg tabs: Take 1 1/2 tablet by mouth every night.  The target INR is 2 - 3.  The next INR is due 10/18/2010.  Anticoagulation instructions were given to patient.  Results were reviewed/authorized by Vashti Hey, RN.  He was notified by Vashti Hey RN.         Prior Anticoagulation Instructions: INR 2.8 Continue coumadin 5mg  once daily except 2.5mg  on Tuesdays, Thursdays and Saturdays  Current Anticoagulation Instructions: INR 1.9 Continue coumadin 5mg  once daily except 2.5mg  on Tuesdays, Thursdays and Saturdays Pt will finish Abx on Fri. 10/13/10.

## 2011-01-16 NOTE — Medication Information (Signed)
Summary: ccr-lr  Anticoagulant Therapy  Managed by: Vashti Hey, RN Supervising MD: Diona Browner MD, Remi Deter Indication 1: Atrial Fibrillation (ICD-427.31) Lab Used: [facility] [place] Site: [place] INR POC 2.2  Dietary changes: no    Health status changes: no    Bleeding/hemorrhagic complications: no    Recent/future hospitalizations: no    Any changes in medication regimen? no    Recent/future dental: no  Any missed doses?: no       Is patient compliant with meds? yes       Allergies: No Known Drug Allergies  Anticoagulation Management History:      The patient is taking warfarin and comes in today for a routine follow up visit.  Positive risk factors for bleeding include an age of 74 years or older.  The bleeding index is 'intermediate risk'.  Negative CHADS2 values include Age > 76 years old.  The start date was 05/21/2006.  Anticoagulation responsible provider: Diona Browner MD, Remi Deter.  INR POC: 2.2.  Cuvette Lot#: [id number].  Exp: 10/11.    Anticoagulation Management Assessment/Plan:      The patient's current anticoagulation dose is Warfarin sodium 5 mg tabs: Take 1 1/2 tablet by mouth every night.  The target INR is 2 - 3.  The next INR is due 03/20/2010.  Anticoagulation instructions were given to patient.  Results were reviewed/authorized by Vashti Hey, RN.  He was notified by Vashti Hey RN.         Prior Anticoagulation Instructions: INR 3.1 Take coumadin 1/2 tablet tonight then resume 1 tablet once daily except 1/2 tablet on Tuesdays, Thursdays and Saturdays  Current Anticoagulation Instructions: INR 2.2 Continue coumadin 5mg  once daily except 2.5mg  on Tuesdays, Thursdays and Saturdays

## 2011-01-16 NOTE — Progress Notes (Signed)
Summary: on antibotic    Phone Note Call from Patient   Caller: Patient Summary of Call: pt has been put on another antibiotic/Levaquin 750mg /pls call with directions regarding coumadin/tg Initial call taken by: Raechel Ache [facility],  February 21, 2010 10:28 AM  Follow-up for Phone Call        Called pt back.  He started levaquin yesterday.  It is for 10 days.  Appt made for pt to come for iNR check on 02/27/10.  Pt in agreement. Follow-up by: Vashti Hey RN,  February 21, 2010 1:47 PM

## 2011-01-16 NOTE — Medication Information (Signed)
Summary: ccr-has been on Z-pack-lr  Anticoagulant Therapy  Managed by: Vashti Hey, RN Supervising MD: Diona Browner MD, Remi Deter Indication 1: Atrial Fibrillation (ICD-427.31) Lab Used: [facility] [place] Site: [place] INR POC 3.7  Dietary changes: no    Health status changes: yes       Details: Bronchitis  Bleeding/hemorrhagic complications: no    Recent/future hospitalizations: no    Any changes in medication regimen? yes       Details: been on Z-pak   Finished   Recent/future dental: no  Any missed doses?: no       Is patient compliant with meds? yes       Allergies: No Known Drug Allergies  Anticoagulation Management History:      The patient is taking warfarin and comes in today for a routine follow up visit.  Positive risk factors for bleeding include an age of 74 years or older.  The bleeding index is 'intermediate risk'.  Negative CHADS2 values include Age > 10 years old.  The start date was 05/21/2006.  Anticoagulation responsible provider: Diona Browner MD, Remi Deter.  INR POC: 3.7.  Cuvette Lot#: [id number].  Exp: 10/11.    Anticoagulation Management Assessment/Plan:      The patient's current anticoagulation dose is Warfarin sodium 5 mg tabs: Take 1 1/2 tablet by mouth every night.  The target INR is 2 - 3.  The next INR is due 05/29/2010.  Anticoagulation instructions were given to patient.  Results were reviewed/authorized by Vashti Hey, RN.  He was notified by Vashti Hey RN.         Prior Anticoagulation Instructions: INR 2.4 Continue coumadin 5mg  once daily except 2.5mg  on Tuesdays, Thursdays and Saturdays  Current Anticoagulation Instructions: INR 3.7 Hold coumadin tonight then resume 5mg  once daily except 2.5mg  on Tuesdays, Thursdays and Saturdays

## 2011-01-16 NOTE — Medication Information (Signed)
Summary: ccr-lr  Anticoagulant Therapy  Managed by: Vashti Hey, RN PCP: [name] Supervising MD: Dietrich Pates MD, Molly Maduro Indication 1: Atrial Fibrillation (ICD-427.31) Lab Used: [facility]  Site: [place] INR POC 2.6  Dietary changes: no    Health status changes: yes       Details: bronchitis  Bleeding/hemorrhagic complications: no    Recent/future hospitalizations: no    Any changes in medication regimen? yes       Details: on prednisone taper and abx since 11/03/10  Recent/future dental: no  Any missed doses?: no       Is patient compliant with meds? yes       Allergies: No Known Drug Allergies  Anticoagulation Management History:      The patient is taking warfarin and comes in today for a routine follow up visit.  Positive risk factors for bleeding include an age of 74 years or older.  The bleeding index is 'intermediate risk'.  Positive CHADS2 values include History of HTN.  Negative CHADS2 values include Age > 74 years old.  The start date was 05/21/2006.  Anticoagulation responsible provider: Dietrich Pates MD, Molly Maduro.  INR POC: 2.6.  Cuvette Lot#: [id number].  Exp: 10/11.    Anticoagulation Management Assessment/Plan:      The patient's current anticoagulation dose is Warfarin sodium 5 mg tabs: Take 1 1/2 tablet by mouth every night.  The target INR is 2 - 3.  The next INR is due 11/15/2010.  Anticoagulation instructions were given to patient.  Results were reviewed/authorized by Vashti Hey, RN.  He was notified by Vashti Hey RN.         Prior Anticoagulation Instructions: INR 2.4 Continue coumadin 5mg  once daily except 2.5mg  on Tuesdays, Thursdays and Saturdays  Current Anticoagulation Instructions: INR 2.6 Continue coumadin 5mg  once daily except 2.5mg  on Tuesdays, Thursdays and Saturdays

## 2011-01-16 NOTE — Medication Information (Signed)
Summary: ccr-lr  Anticoagulant Therapy  Managed by: Vashti Hey, RN PCP: [name] Supervising MD: Diona Browner MD, Remi Deter Indication 1: Atrial Fibrillation (ICD-427.31) Lab Used: [facility] [place] Site: [place] INR POC 2.7  Dietary changes: no    Health status changes: no    Bleeding/hemorrhagic complications: no    Recent/future hospitalizations: no    Any changes in medication regimen? yes       Details: Finished Abx Sunday  Recent/future dental: no  Any missed doses?: no       Is patient compliant with meds? yes       Allergies: No Known Drug Allergies  Anticoagulation Management History:      The patient is taking warfarin and comes in today for a routine follow up visit.  Positive risk factors for bleeding include an age of 74 years or older.  The bleeding index is 'intermediate risk'.  Positive CHADS2 values include History of HTN.  Negative CHADS2 values include Age > 74 years old.  The start date was 05/21/2006.  Anticoagulation responsible provider: Diona Browner MD, Remi Deter.  INR POC: 2.7.  Cuvette Lot#: [id number].  Exp: 10/11.    Anticoagulation Management Assessment/Plan:      The patient's current anticoagulation dose is Warfarin sodium 5 mg tabs: Take 1 1/2 tablet by mouth every night.  The target INR is 2 - 3.  The next INR is due 12/13/2010.  Anticoagulation instructions were given to patient.  Results were reviewed/authorized by Vashti Hey, RN.  He was notified by Vashti Hey RN.         Prior Anticoagulation Instructions: INR 2.6 Continue coumadin 5mg  once daily except 2.5mg  on Tuesdays, Thursdays and Saturdays   Current Anticoagulation Instructions: INR 2.7 Continue coumadin 5mg  once daily except 2.5mg  on Tuesdays, Thursdays and Saturdays

## 2011-01-16 NOTE — Medication Information (Signed)
Summary: ccr-lr  Anticoagulant Therapy  Managed by: Vashti Hey, RN Supervising MD: Dietrich Pates MD, Molly Maduro Indication 1: Atrial Fibrillation (ICD-427.31) Lab Used: [facility] [place] Site: [place] INR POC 1.8  Dietary changes: no    Health status changes: no    Bleeding/hemorrhagic complications: no    Recent/future hospitalizations: no    Any changes in medication regimen? no    Recent/future dental: no  Any missed doses?: no       Is patient compliant with meds? yes       Allergies: No Known Drug Allergies  Anticoagulation Management History:      The patient is taking warfarin and comes in today for a routine follow up visit.  Positive risk factors for bleeding include an age of 74 years or older.  The bleeding index is 'intermediate risk'.  Negative CHADS2 values include Age > 74 years old.  The start date was 05/21/2006.  Anticoagulation responsible provider: Dietrich Pates MD, Molly Maduro.  INR POC: 1.8.  Cuvette Lot#: [id number].  Exp: 10/11.    Anticoagulation Management Assessment/Plan:      The patient's current anticoagulation dose is Warfarin sodium 5 mg tabs: Take 1 1/2 tablet by mouth every night.  The target INR is 2 - 3.  The next INR is due 04/13/2010.  Anticoagulation instructions were given to patient.  Results were reviewed/authorized by Vashti Hey, RN.  He was notified by Vashti Hey RN.         Prior Anticoagulation Instructions: INR 3.7 Hold coumadin tonight then resume 5mg  once daily except 2.5mg  Tuesdays, Thursdays and Saturdays  Current Anticoagulation Instructions: INR 1.8 Take coumadin 2 tablets today then resume 1 tablet once daily except 1/2 tablet on Tuesdays, Thursdays and Saturdays

## 2011-01-16 NOTE — Medication Information (Signed)
Summary: ccr-lr  Anticoagulant Therapy  Managed by: Vashti Hey, RN Supervising MD: Diona Browner MD, Remi Deter Indication 1: Atrial Fibrillation (ICD-427.31) Lab Used: [facility] [place] Site: [place] INR POC 3.7  Dietary changes: no    Health status changes: yes       Details: bronchitis  Bleeding/hemorrhagic complications: no    Recent/future hospitalizations: no    Any changes in medication regimen? yes       Details: on levaquin   Finishes 03/01/10  Recent/future dental: no  Any missed doses?: no       Is patient compliant with meds? yes       Allergies: No Known Drug Allergies  Anticoagulation Management History:      The patient is taking warfarin and comes in today for a routine follow up visit.  Positive risk factors for bleeding include an age of 74 years or older.  The bleeding index is 'intermediate risk'.  Negative CHADS2 values include Age > 69 years old.  The start date was 05/21/2006.  Anticoagulation responsible provider: Diona Browner MD, Remi Deter.  INR POC: 3.7.  Cuvette Lot#: [id number].  Exp: 10/11.    Anticoagulation Management Assessment/Plan:      The patient's current anticoagulation dose is Warfarin sodium 5 mg tabs: Take 1 1/2 tablet by mouth every night.  The target INR is 2 - 3.  The next INR is due 03/20/2010.  Anticoagulation instructions were given to patient.  Results were reviewed/authorized by Vashti Hey, RN.  He was notified by Vashti Hey RN.         Prior Anticoagulation Instructions: INR 2.2 Continue coumadin 5mg  once daily except 2.5mg  on Tuesdays, Thursdays and Saturdays  Current Anticoagulation Instructions: INR 3.7 Hold coumadin tonight then resume 5mg  once daily except 2.5mg  Tuesdays, Thursdays and Saturdays

## 2011-01-16 NOTE — Medication Information (Signed)
Summary: ccr-lr  Anticoagulant Therapy  Managed by: Vashti Hey, RN Supervising MD: Diona Browner MD, Remi Deter Indication 1: Atrial Fibrillation (ICD-427.31) Lab Used: [facility] [place] Site: [place] INR POC 3.1  Dietary changes: no    Health status changes: yes       Details: chest congestion  Bleeding/hemorrhagic complications: no    Recent/future hospitalizations: no    Any changes in medication regimen? yes       Details: started on cefuroxine 500mg  bid and prednisone 02/03/10  Finishes 2/27  Recent/future dental: no  Any missed doses?: no       Is patient compliant with meds? yes       Allergies: No Known Drug Allergies  Anticoagulation Management History:      The patient is taking warfarin and comes in today for a routine follow up visit.  Positive risk factors for bleeding include an age of 74 years or older.  The bleeding index is 'intermediate risk'.  Negative CHADS2 values include Age > 66 years old.  The start date was 05/21/2006.  Anticoagulation responsible provider: Diona Browner MD, Remi Deter.  INR POC: 3.1.  Cuvette Lot#: [id number].  Exp: 10/11.    Anticoagulation Management Assessment/Plan:      The patient's current anticoagulation dose is Warfarin sodium 5 mg tabs: Take 1 1/2 tablet by mouth every night.  The target INR is 2 - 3.  The next INR is due 02/16/2010.  Anticoagulation instructions were given to patient.  Results were reviewed/authorized by Vashti Hey, RN.  He was notified by Vashti Hey RN.         Prior Anticoagulation Instructions: INR 2.5 Continue coumadin 5mg  once daily except 2.5mg  on Tuesdays, Thursdays and Saturdays  Current Anticoagulation Instructions: INR 3.1 Take coumadin 1/2 tablet tonight then resume 1 tablet once daily except 1/2 tablet on Tuesdays, Thursdays and Saturdays

## 2011-01-16 NOTE — Medication Information (Signed)
Summary: ccr-lr  Anticoagulant Therapy  Managed by: Vashti Hey, RN Supervising MD: Dietrich Pates MD, Molly Maduro Indication 1: Atrial Fibrillation (ICD-427.31) Lab Used: [facility] [place] Site: [place] INR POC 2.4  Dietary changes: no    Health status changes: no    Bleeding/hemorrhagic complications: no    Recent/future hospitalizations: no    Any changes in medication regimen? no    Recent/future dental: no  Any missed doses?: no       Is patient compliant with meds? yes       Allergies: No Known Drug Allergies  Anticoagulation Management History:      The patient is taking warfarin and comes in today for a routine follow up visit.  Positive risk factors for bleeding include an age of 74 years or older.  The bleeding index is 'intermediate risk'.  Negative CHADS2 values include Age > 74 years old.  The start date was 05/21/2006.  Anticoagulation responsible provider: Dietrich Pates MD, Molly Maduro.  INR POC: 2.4.  Cuvette Lot#: [id number].  Exp: 10/11.    Anticoagulation Management Assessment/Plan:      The patient's current anticoagulation dose is Warfarin sodium 5 mg tabs: Take 1 1/2 tablet by mouth every night.  The target INR is 2 - 3.  The next INR is due 05/11/2010.  Anticoagulation instructions were given to patient.  Results were reviewed/authorized by Vashti Hey, RN.  He was notified by Vashti Hey RN.         Prior Anticoagulation Instructions: INR 1.8 Take coumadin 2 tablets today then resume 1 tablet once daily except 1/2 tablet on Tuesdays, Thursdays and Saturdays  Current Anticoagulation Instructions: INR 2.4 Continue coumadin 5mg  once daily except 2.5mg  on Tuesdays, Thursdays and Saturdays

## 2011-01-16 NOTE — Progress Notes (Signed)
Summary: started on antibiotic  Phone Note Call from Patient   Caller: Patient Summary of Call: FYI: Pt was started on Corfen DM liquid (cough syrup)/Cefuroxlme 500mg  (antibiotic)/tg Initial call taken by: Raechel Ache [facility],  January 12, 2010 12:31 PM  Follow-up for Phone Call        Called pt's wife.  Will check coumadin 01/18/10 at 8:45am since pt has been started on antibiotic.  Wife verbalized understanding. Follow-up by: Vashti Hey RN,  January 12, 2010 12:48 PM

## 2011-01-16 NOTE — Progress Notes (Signed)
Summary: needs f/u-refills  Phone Note Outgoing Call   Call placed by: Larita Fife Via LPN,  August 30, 2010 4:58 PM Summary of Call: We received a refill request for Warfarin. Pt. has not been seen for f/u since 2008. Appt. scheduled for Sept. 28 with Dr. Eden Emms. Initial call taken by: Larita Fife [facility] LPN,  August 30, 2010 4:59 PM

## 2011-01-16 NOTE — Medication Information (Signed)
Summary: ccr-lr  Anticoagulant Therapy  Managed by: Vashti Hey, RN Supervising MD: Dietrich Pates MD, Molly Maduro Indication 1: Atrial Fibrillation (ICD-427.31) Lab Used: [facility] [place] Site: [place] INR POC 2.7  Dietary changes: no    Health status changes: no    Bleeding/hemorrhagic complications: no    Recent/future hospitalizations: no    Any changes in medication regimen? no    Recent/future dental: no  Any missed doses?: no       Is patient compliant with meds? yes       Allergies: No Known Drug Allergies  Anticoagulation Management History:      The patient is taking warfarin and comes in today for a routine follow up visit.  Positive risk factors for bleeding include an age of 74 years or older.  The bleeding index is 'intermediate risk'.  Negative CHADS2 values include Age > 65 years old.  The start date was 05/21/2006.  Anticoagulation responsible provider: Dietrich Pates MD, Molly Maduro.  INR POC: 2.7.  Cuvette Lot#: [id number].  Exp: 10/11.    Anticoagulation Management Assessment/Plan:      The patient's current anticoagulation dose is Warfarin sodium 5 mg tabs: Take 1 1/2 tablet by mouth every night.  The target INR is 2 - 3.  The next INR is due 06/26/2010.  Anticoagulation instructions were given to patient.  Results were reviewed/authorized by Vashti Hey, RN.  He was notified by Vashti Hey RN.         Prior Anticoagulation Instructions: INR 3.7 Hold coumadin tonight then resume 5mg  once daily except 2.5mg  on Tuesdays, Thursdays and Saturdays  Current Anticoagulation Instructions: INR 2.7 Continue coumadin 5mg  once daily except 2.5mg  on Tuesdays, Thursdays and Saturdays

## 2011-01-16 NOTE — Medication Information (Signed)
Summary: ccr-lr  Anticoagulant Therapy  Managed by: Vashti Hey, RN PCP: [name] Supervising MD: Dietrich Pates MD, Molly Maduro Indication 1: Atrial Fibrillation (ICD-427.31) Lab Used: [facility] [place] Site:  INR POC 2.8  Dietary changes: no    Health status changes: yes       Details: had flu and bronchitis  Bleeding/hemorrhagic complications: no    Recent/future hospitalizations: no    Any changes in medication regimen? yes       Details: took Z pack  Has finished it   Has another Abx that he will finish Saturday  Recent/future dental: no  Any missed doses?: no       Is patient compliant with meds? yes       Allergies: No Known Drug Allergies  Anticoagulation Management History:      The patient is taking warfarin and comes in today for a routine follow up visit.  Positive risk factors for bleeding include an age of 74 years or older.  The bleeding index is 'intermediate risk'.  Positive CHADS2 values include History of HTN.  Negative CHADS2 values include Age > 72 years old.  The start date was 05/21/2006.  Anticoagulation responsible provider: Dietrich Pates MD, Molly Maduro.  INR POC: 2.8.  Exp: 10/11.    Anticoagulation Management Assessment/Plan:      The patient's current anticoagulation dose is Warfarin sodium 5 mg tabs: Take 1 1/2 tablet by mouth every night.  The target INR is 2 - 3.  The next INR is due 10/18/2010.  Anticoagulation instructions were given to patient.  Results were reviewed/authorized by Vashti Hey, RN.  He was notified by Vashti Hey RN.         Prior Anticoagulation Instructions: INR 2.4 Continue coumadin 5mg  once daily except 2.5mg  on Tuesdays, Thursdays and Saturdays  Current Anticoagulation Instructions: INR 2.8 Continue coumadin 5mg  once daily except 2.5mg  on Tuesdays, Thursdays and Saturdays

## 2011-01-16 NOTE — Progress Notes (Signed)
Summary: [id number]  Phone Note Call from Patient   Caller: Patient Summary of Call: FYI: Pt was put on antibiotics as follows: Cefuroxine 500mg  two times a day Prednisone 10mg  as directed Corfen DM cough syrup as needed Raechel Ache [facility]  February 06, 2010 10:14 AM  Initial call taken by: Raechel Ache [facility],  February 06, 2010 10:14 AM  Follow-up for Phone Call        Called pt.  He was started on Abx and steroids on Fri. 02/03/10.  Appt made to check INR on 02/08/10.  Pt in agreement. Follow-up by: Vashti Hey RN,  February 06, 2010 11:26 AM

## 2011-01-16 NOTE — Medication Information (Signed)
Summary: ccr-lr  Anticoagulant Therapy  Managed by: Vashti Hey, RN PCP: [name] Supervising MD: Diona Browner MD, Remi Deter Indication 1: Atrial Fibrillation (ICD-427.31) Lab Used: [facility] [place] Site: [place] INR POC 2.4  Dietary changes: no    Health status changes: no    Bleeding/hemorrhagic complications: no    Recent/future hospitalizations: no    Any changes in medication regimen? no    Recent/future dental: no  Any missed doses?: no       Is patient compliant with meds? yes       Allergies: No Known Drug Allergies  Anticoagulation Management History:      The patient is taking warfarin and comes in today for a routine follow up visit.  Positive risk factors for bleeding include an age of 14 years or older.  The bleeding index is 'intermediate risk'.  Positive CHADS2 values include History of HTN.  Negative CHADS2 values include Age > 17 years old.  The start date was 05/21/2006.  Anticoagulation responsible provider: Diona Browner MD, Remi Deter.  INR POC: 2.4.  Cuvette Lot#: [id number].  Exp: 10/11.    Anticoagulation Management Assessment/Plan:      The patient's current anticoagulation dose is Warfarin sodium 5 mg tabs: Take 1 1/2 tablet by mouth every night.  The target INR is 2 - 3.  The next INR is due 10/18/2010.  Anticoagulation instructions were given to patient.  Results were reviewed/authorized by Vashti Hey, RN.  He was notified by Vashti Hey RN.         Prior Anticoagulation Instructions: INR 2.4 Continue coumadin 5mg  once daily except 2.5mg  on T,Th,Sat  Current Anticoagulation Instructions: INR 2.4 Continue coumadin 5mg  once daily except 2.5mg  on Tuesdays, Thursdays and Saturdays

## 2011-01-18 NOTE — Medication Information (Signed)
Summary: ccr-lr  Anticoagulant Therapy  Managed by: Vashti Hey, RN PCP: [name] Supervising MD: Diona Browner MD, Remi Deter Indication 1: Atrial Fibrillation (ICD-427.31) Lab Used: [facility] [place] Site: [place] INR POC 2.4  Dietary changes: no    Health status changes: no    Bleeding/hemorrhagic complications: no    Recent/future hospitalizations: no    Any changes in medication regimen? no    Recent/future dental: no  Any missed doses?: no       Is patient compliant with meds? yes       Allergies: No Known Drug Allergies  Anticoagulation Management History:      The patient is taking warfarin and comes in today for a routine follow up visit.  Positive risk factors for bleeding include an age of 74 years or older.  The bleeding index is 'intermediate risk'.  Positive CHADS2 values include History of HTN.  Negative CHADS2 values include Age > 19 years old.  The start date was 05/21/2006.  Anticoagulation responsible provider: Diona Browner MD, Remi Deter.  INR POC: 2.4.  Cuvette Lot#: [id number].  Exp: 10/11.    Anticoagulation Management Assessment/Plan:      The patient's current anticoagulation dose is Warfarin sodium 5 mg tabs: Take 1 1/2 tablet by mouth every night.  The target INR is 2 - 3.  The next INR is due 02/07/2011.  Anticoagulation instructions were given to patient.  Results were reviewed/authorized by Vashti Hey, RN.  He was notified by Vashti Hey RN.         Prior Anticoagulation Instructions: INR 2.0 Take coumadin 1 1/2 tablets tonight then resume 1 tablet once daily except 1/2 tablet on Tuesdays, Thursdays and Saturdays  Current Anticoagulation Instructions: INR 2.4 Continue coumadin 5mg  once daily except 2.5mg  on T,Th,Sat

## 2011-01-18 NOTE — Medication Information (Signed)
Summary: ccr-lr  Anticoagulant Therapy  Managed by: Vashti Hey, RN PCP: [name] Supervising MD: Dietrich Pates MD, Molly Maduro Indication 1: Atrial Fibrillation (ICD-427.31) Lab Used: [facility] [place] Site: [place] INR POC 2.0  Dietary changes: no    Health status changes: no    Bleeding/hemorrhagic complications: no    Recent/future hospitalizations: no    Any changes in medication regimen? no    Recent/future dental: no  Any missed doses?: yes     Details: Missed 2 doses this month  Is patient compliant with meds? yes       Allergies: No Known Drug Allergies  Anticoagulation Management History:      The patient is taking warfarin and comes in today for a routine follow up visit.  Positive risk factors for bleeding include an age of 74 years or older.  The bleeding index is 'intermediate risk'.  Positive CHADS2 values include History of HTN.  Negative CHADS2 values include Age > 37 years old.  The start date was 05/21/2006.  Anticoagulation responsible provider: Dietrich Pates MD, Molly Maduro.  INR POC: 2.0.  Cuvette Lot#: [id number].  Exp: 10/11.    Anticoagulation Management Assessment/Plan:      The patient's current anticoagulation dose is Warfarin sodium 5 mg tabs: Take 1 1/2 tablet by mouth every night.  The target INR is 2 - 3.  The next INR is due 01/10/2011.  Anticoagulation instructions were given to patient.  Results were reviewed/authorized by Vashti Hey, RN.  He was notified by Vashti Hey RN.         Prior Anticoagulation Instructions: INR 2.7 Continue coumadin 5mg  once daily except 2.5mg  on Tuesdays, Thursdays and Saturdays  Current Anticoagulation Instructions: INR 2.0 Take coumadin 1 1/2 tablets tonight then resume 1 tablet once daily except 1/2 tablet on Tuesdays, Thursdays and Saturdays

## 2011-02-07 ENCOUNTER — Encounter (INDEPENDENT_AMBULATORY_CARE_PROVIDER_SITE_OTHER): Payer: PRIVATE HEALTH INSURANCE

## 2011-02-07 ENCOUNTER — Encounter: Payer: Self-pay | Admitting: Cardiology

## 2011-02-07 DIAGNOSIS — I4891 Unspecified atrial fibrillation: Secondary | ICD-10-CM

## 2011-02-07 DIAGNOSIS — Z7901 Long term (current) use of anticoagulants: Secondary | ICD-10-CM

## 2011-02-13 NOTE — Medication Information (Signed)
Summary: ccr-lr  Anticoagulant Therapy  Managed by: Vashti Hey, RN PCP: [name] Supervising MD: Diona Browner MD, Remi Deter Indication 1: Atrial Fibrillation (ICD-427.31) Lab Used: [facility] [place] Site: [place] INR POC 2.8  Dietary changes: no    Health status changes: no    Bleeding/hemorrhagic complications: no    Recent/future hospitalizations: no    Any changes in medication regimen? no    Recent/future dental: no  Any missed doses?: no       Is patient compliant with meds? yes       Allergies: No Known Drug Allergies  Anticoagulation Management History:      The patient is taking warfarin and comes in today for a routine follow up visit.  Positive risk factors for bleeding include an age of 74 years or older.  The bleeding index is 'intermediate risk'.  Positive CHADS2 values include History of HTN.  Negative CHADS2 values include Age > 46 years old.  The start date was 05/21/2006.  Anticoagulation responsible provider: Diona Browner MD, Remi Deter.  INR POC: 2.8.  Cuvette Lot#: [id number].  Exp: 10/11.    Anticoagulation Management Assessment/Plan:      The patient's current anticoagulation dose is Warfarin sodium 5 mg tabs: Take 1 1/2 tablet by mouth every night.  The target INR is 2 - 3.  The next INR is due 03/07/2011.  Anticoagulation instructions were given to patient.  Results were reviewed/authorized by Vashti Hey, RN.  He was notified by Vashti Hey RN.         Prior Anticoagulation Instructions: INR 2.4 Continue coumadin 5mg  once daily except 2.5mg  on T,Th,Sat  Current Anticoagulation Instructions: INR 2.8 Continue coumadin 5mg  once daily except 2.5mg  on Tuesdays, Thursdays and Saturdays

## 2011-03-05 ENCOUNTER — Encounter: Payer: Self-pay | Admitting: Cardiovascular Disease

## 2011-03-05 DIAGNOSIS — I4891 Unspecified atrial fibrillation: Secondary | ICD-10-CM

## 2011-03-07 ENCOUNTER — Ambulatory Visit (INDEPENDENT_AMBULATORY_CARE_PROVIDER_SITE_OTHER): Payer: PRIVATE HEALTH INSURANCE | Admitting: *Deleted

## 2011-03-07 DIAGNOSIS — I4891 Unspecified atrial fibrillation: Secondary | ICD-10-CM

## 2011-03-07 NOTE — Patient Instructions (Signed)
Continue coumadin 5mg  qd except 2.5mg  on T,Th,Sat

## 2011-03-25 LAB — DIFFERENTIAL
Basophils Absolute: 0 10*3/uL (ref 0.0–0.1)
Basophils Relative: 0 % (ref 0–1)
Eosinophils Absolute: 0.1 10*3/uL (ref 0.0–0.7)
Neutrophils Relative %: 73 % (ref 43–77)

## 2011-03-25 LAB — COMPREHENSIVE METABOLIC PANEL
ALT: 24 U/L (ref 0–53)
Alkaline Phosphatase: 81 U/L (ref 39–117)
CO2: 26 mEq/L (ref 19–32)
Calcium: 9.9 mg/dL (ref 8.4–10.5)
GFR calc non Af Amer: >60 mL/min (ref >60)
Glucose, Bld: 94 mg/dL (ref 70–99)
Potassium: 3.8 mEq/L (ref 3.5–5.1)
Sodium: 141 mEq/L (ref 135–145)
Total Bilirubin: 0.4 mg/dL (ref 0.3–1.2)

## 2011-03-25 LAB — GLUCOSE, CAPILLARY
Glucose-Capillary: 181 mg/dL — ABNORMAL HIGH (ref 70–99)
Glucose-Capillary: 245 mg/dL — ABNORMAL HIGH (ref 70–99)
Glucose-Capillary: 297 mg/dL — ABNORMAL HIGH (ref 70–99)
Glucose-Capillary: 406 mg/dL — ABNORMAL HIGH (ref 70–99)

## 2011-03-25 LAB — CULTURE, BLOOD (ROUTINE X 2)
Culture: NO GROWTH
Report Status: 7262010

## 2011-03-25 LAB — CARDIAC PANEL(CRET KIN+CKTOT+MB+TROPI)
CK, MB: 1.8 ng/mL (ref 0.3–4.0)
Relative Index: INVALID (ref 0.0–2.5)
Relative Index: INVALID (ref 0.0–2.5)
Total CK: 28 U/L (ref 7–232)

## 2011-03-25 LAB — PROTIME-INR
INR: 2 — ABNORMAL HIGH (ref 0.00–1.49)
INR: 2.1 — ABNORMAL HIGH (ref 0.00–1.49)
INR: 3 — ABNORMAL HIGH (ref 0.00–1.49)
Prothrombin Time: 23.7 seconds — ABNORMAL HIGH (ref 11.6–15.2)
Prothrombin Time: 25.2 seconds — ABNORMAL HIGH (ref 11.6–15.2)

## 2011-03-25 LAB — CULTURE, RESPIRATORY W GRAM STAIN

## 2011-03-25 LAB — CBC
Hemoglobin: 13.9 g/dL (ref 13.0–17.0)
MCHC: 34.7 g/dL (ref 30.0–36.0)
RBC: 4.65 MIL/uL (ref 4.22–5.81)

## 2011-03-27 ENCOUNTER — Other Ambulatory Visit: Payer: Self-pay | Admitting: Cardiovascular Disease

## 2011-03-29 LAB — GLUCOSE, CAPILLARY
Glucose-Capillary: 105 mg/dL — ABNORMAL HIGH (ref 70–99)
Glucose-Capillary: 111 mg/dL — ABNORMAL HIGH (ref 70–99)
Glucose-Capillary: 128 mg/dL — ABNORMAL HIGH (ref 70–99)
Glucose-Capillary: 178 mg/dL — ABNORMAL HIGH (ref 70–99)

## 2011-03-29 LAB — DIFFERENTIAL
Basophils Absolute: 0 10*3/uL (ref 0.0–0.1)
Basophils Relative: 0 % (ref 0–1)
Eosinophils Absolute: 0 10*3/uL (ref 0.0–0.7)
Eosinophils Relative: 2 % (ref 0–5)
Lymphocytes Relative: 13 % (ref 12–46)
Lymphs Abs: 0.7 10*3/uL (ref 0.7–4.0)
Monocytes Absolute: 0.9 10*3/uL (ref 0.1–1.0)
Monocytes Relative: 7 % (ref 3–12)
Neutro Abs: 5.9 10*3/uL (ref 1.7–7.7)
Neutrophils Relative %: 77 % (ref 43–77)

## 2011-03-29 LAB — BASIC METABOLIC PANEL
BUN: 10 mg/dL (ref 6–23)
BUN: 9 mg/dL (ref 6–23)
CO2: 25 mEq/L (ref 19–32)
CO2: 30 mEq/L (ref 19–32)
Calcium: 9.2 mg/dL (ref 8.4–10.5)
Calcium: 9.3 mg/dL (ref 8.4–10.5)
Chloride: 101 mEq/L (ref 96–112)
Chloride: 101 mEq/L (ref 96–112)
Creatinine, Ser: 0.55 mg/dL (ref 0.4–1.5)
Creatinine, Ser: 0.56 mg/dL (ref 0.4–1.5)
GFR calc Af Amer: >60 mL/min (ref >60)
GFR calc non Af Amer: >60 mL/min (ref >60)
Glucose, Bld: 109 mg/dL — ABNORMAL HIGH (ref 70–99)
Potassium: 3.6 mEq/L (ref 3.5–5.1)
Sodium: 134 mEq/L — ABNORMAL LOW (ref 135–145)

## 2011-03-29 LAB — URINALYSIS, ROUTINE W REFLEX MICROSCOPIC
Glucose, UA: NEGATIVE mg/dL
Hgb urine dipstick: NEGATIVE
Protein, ur: NEGATIVE mg/dL
Specific Gravity, Urine: 1.02 (ref 1.005–1.030)
pH: 6 (ref 5.0–8.0)

## 2011-03-29 LAB — CBC
HCT: 34 % — ABNORMAL LOW (ref 39.0–52.0)
HCT: 41.2 % (ref 39.0–52.0)
Hemoglobin: 13.9 g/dL (ref 13.0–17.0)
MCHC: 33.8 g/dL (ref 30.0–36.0)
MCV: 87.5 fL (ref 78.0–100.0)
Platelets: 230 10*3/uL (ref 150–400)
RBC: 4.71 MIL/uL (ref 4.22–5.81)
RDW: 15.4 % (ref 11.5–15.5)
WBC: 11.9 10*3/uL — ABNORMAL HIGH (ref 4.0–10.5)

## 2011-03-29 LAB — PROTIME-INR
INR: 1.8 — ABNORMAL HIGH (ref 0.00–1.49)
INR: 1.8 — ABNORMAL HIGH (ref 0.00–1.49)
INR: 1.9 — ABNORMAL HIGH (ref 0.00–1.49)
Prothrombin Time: 21.5 seconds — ABNORMAL HIGH (ref 11.6–15.2)

## 2011-04-04 ENCOUNTER — Ambulatory Visit (INDEPENDENT_AMBULATORY_CARE_PROVIDER_SITE_OTHER): Payer: PRIVATE HEALTH INSURANCE | Admitting: *Deleted

## 2011-04-04 DIAGNOSIS — I4891 Unspecified atrial fibrillation: Secondary | ICD-10-CM

## 2011-04-04 LAB — POCT INR: INR: 2.1

## 2011-04-19 ENCOUNTER — Ambulatory Visit (HOSPITAL_COMMUNITY)
Admission: RE | Admit: 2011-04-19 | Discharge: 2011-04-19 | Disposition: A | Discharge status: Home / Self Care | Payer: Medicare Other | Source: Ambulatory Visit | Attending: Pulmonary Disease | Admitting: Pulmonary Disease

## 2011-04-19 ENCOUNTER — Other Ambulatory Visit (HOSPITAL_COMMUNITY): Payer: Self-pay | Admitting: Pulmonary Disease

## 2011-04-19 DIAGNOSIS — R042 Hemoptysis: Secondary | ICD-10-CM | POA: Insufficient documentation

## 2011-04-19 DIAGNOSIS — R05 Cough: Secondary | ICD-10-CM

## 2011-04-19 DIAGNOSIS — J42 Unspecified chronic bronchitis: Secondary | ICD-10-CM | POA: Insufficient documentation

## 2011-04-19 DIAGNOSIS — J449 Chronic obstructive pulmonary disease, unspecified: Secondary | ICD-10-CM | POA: Insufficient documentation

## 2011-04-19 DIAGNOSIS — J4489 Other specified chronic obstructive pulmonary disease: Secondary | ICD-10-CM | POA: Insufficient documentation

## 2011-04-26 ENCOUNTER — Encounter: Payer: PRIVATE HEALTH INSURANCE | Admitting: *Deleted

## 2011-05-01 NOTE — Discharge Summary (Signed)
NAME:  Caleb [name], Caleb [name]               ACCOUNT NO.:  [id number]   MEDICAL RECORD NO.:  [id number]          PATIENT TYPE:  INP   LOCATION:  A226                          FACILITY:  [facility]   PHYSICIAN:  Edward L. Juanetta Gosling, M.D.DATE OF BIRTH:  05-Nov-1935   DATE OF ADMISSION:  02/27/2008  DATE OF DISCHARGE:  LH                               DISCHARGE SUMMARY   FINAL DISCHARGE DIAGNOSES:  1. Hemoptysis.  2. Previous history of tuberculosis.  3. COPD.  4. Coronary artery occlusive disease.  5. Hypertension.  6. Diabetes.  7. Peptic ulcer disease.  8. Hyperlipidemia.  9. Atrial fibrillation.  10.History of pulmonary emboli.   [name] is a patient who has multiple medical problems including  COPD, history of TB in the past and previous pulmonary emboli. He  started coughing up blood on the day prior to admission. He coughed up  significant amounts.  He came to the emergency room and was found to  have frank hemoptysis with bright and dark red blood.  This is not  massive.   PHYSICAL EXAMINATION:  VITALS:  Physical examination shows blood  pressure 120/80, pulse 80, respirations 16.  HEENT:  His pupils were reactive.  NECK:  Supple.  CHEST:  Shows some rhonchi and wheezing but no rales.  HEART:  Regular.  EXTREMITIES:  Extremities showed no edema.  NEURO: CNS was grossly intact.   LABORATORY DATA:  Chest x-ray showed no definite infiltrate.  His white  blood count was 9000, hemoglobin 14.1, platelets 295, PT 25.8, INR of  2.2.  Electrolytes were normal.   HOSPITAL COURSE:  He was treated with antibiotics, given cough  suppressants, nebulizer treatments and his Coumadin was held.  By the  16th his hemoptysis had stopped and he was discharged home in improved  condition on Ceftin 500 mg b.i.d. x days, simvastatin 40 mg daily,  tramadol with acetaminophen 50/250 4 times a day as needed for pain,  lisinopril 10 mg daily, glyburide 5 mg daily, omeprazole 20 mg daily,  metoclopramide 10 mg at  bedtime, Proscar 5 mg daily, gemfibrozil 600 mg b.i.d., vitamin C 500 mg  daily, oxybutynin 5 mg daily, felodipine 5 mg daily, metformin 500 mg  b.i.d. Coumadin the dose varies somewhat but averages 5 mg a day,  Cardizem CD 240 daily and a nitroglycerin patch.  I have also written  him a prescription for Tussionex 5 mL q.12 h p.r.n. cough.      Edward L. Juanetta Gosling, M.D.  Electronically Signed     ELH/MEDQ  D:  03/01/2008  T:  03/01/2008  Job:  [id number]

## 2011-05-01 NOTE — Group Therapy Note (Signed)
NAME:  Caleb [name], Caleb [name]               ACCOUNT NO.:  [id number]   MEDICAL RECORD NO.:  [id number]          PATIENT TYPE:  INP   LOCATION:  A226                          FACILITY:  [facility]   PHYSICIAN:  Edward L. Juanetta Gosling, M.D.DATE OF BIRTH:  [birth year]   DATE OF PROCEDURE:  DATE OF DISCHARGE:                                 PROGRESS NOTE   Caleb [name] is overall better.  He did have more hemoptysis yesterday  but this morning he has coughed up some green sputum but no blood.  I  had intended on letting him go home yesterday but then he had more  hemoptysis, so I held off on that.   PHYSICAL EXAMINATION:  GENERAL:  This morning he is awake and alert.  VITALS:  His pulses in the 60s, respirations 18, O2 sats 94% on room  air. Blood sugar is 118. Blood pressure 130/72.  CHEST:  Clear.  HEART:  Regular or at least sounds regular.  ABDOMEN:  Soft.  EXTREMITIES:  Showed no edema.  He has greenish sputum.   ASSESSMENT:  He seems to be better.  He does have hemoptysis. He has  severe COPD which is pretty stable.  He is really not wheezing a lot.  He has atrial fibrillation which is chronic.   PLAN:  Is to let him walk around again today, that is would started his  hemoptysis yesterday and then plan for him to be discharged after that.      Edward L. Juanetta Gosling, M.D.  Electronically Signed     ELH/MEDQ  D:  03/02/2008  T:  03/02/2008  Job:  [id number]

## 2011-05-01 NOTE — H&P (Signed)
NAME:  Caleb [name], Caleb [name] NO.:  [id number]   MEDICAL RECORD NO.:  [id number]          PATIENT TYPE:  INP   LOCATION:  A305                          FACILITY:  [facility]   PHYSICIAN:  Kingsley Callander. Ouida Sills, MD       DATE OF BIRTH:  03-11-1935   DATE OF ADMISSION:  07/05/2009  DATE OF DISCHARGE:  LH                              HISTORY & PHYSICAL   CHIEF COMPLAINT:  Cannot breathe.   HISTORY OF PRESENT ILLNESS:  This patient is a 76 year old white male  patient of Dr. Juanetta Gosling with chronic bronchitis who presented to the  office with increased difficulty breathing over the past 2 days.  He had  progressive coughing with wheezing and yellow sputum production.  He  denied any known fever.  He states the cough was so severe he was really  unable to sleep the night before.  He has lost his breath and felt quite  distressed at times.  He has used home nebulizers without relief.  He  was evaluated in the office and found to be wheezing quite significantly  and was therefore admitted for additional therapy.   PAST MEDICAL HISTORY:  1. COPD.  2. Pneumonia.  3. Coronary artery disease.  4. Diabetes.  5. GERD.  6. Tuberculosis.  7. Peptic ulcer disease.  8  Hyperlipidemia.  1. Pulmonary emboli.  2. BPH.  3. Cholecystectomy.  4. Diverticulitis.  5. Bilateral inguinal hernia repair.   MEDICATIONS:  Obtained from his last discharge March 2010.  He states  they are unchanged.  He was not able to tell me his medications.  1. Simvastatin 40 mg daily.  2. Tramadol 50 mg q.i.d. p.r.n.  3. Lisinopril 10 mg daily.  4. Glyburide 5 mg daily.  5. Omeprazole 20 mg daily.  6. Metoclopramide 10 mg nightly.  7. Proscar 5 mg daily.  8. Gemfibrozil 600 mg b.i.d.  9. Vitamin C 500 mg daily.  10.Oxybutynin 5 mg daily.  11.Felodipine 5 mg daily.  12.Metformin 500 mg b.i.d.  13.Coumadin 5 mg alternating with 2.5 mg every other day (he had a      check with the [facility] the day before  admission and had an      INR of 1.7 he states).  14.Diltiazem 240 mg daily.  15.Spiriva daily.  16.Albuterol daily and another inhaler which he cannot name.   SOCIAL HISTORY:  He has not smoked for 11 years.  He is a retired  Visual merchandiser.  He does not drink alcohol or use recreational drugs.   FAMILY HISTORY:  Father had an MI.  His mother had a sudden death.  A  sister had stomach cancer.   REVIEW OF SYSTEMS:  Noncontributory.   PHYSICAL EXAM:  Temperature 97.9, blood pressure 112/72, pulse 96,  oxygen saturation 95, weight 211, respirations 22.  GENERAL:  Dyspneic alert white male.  HEENT:  No scleral icterus.  Pharynx is dry.  NECK:  No JVD or thyromegaly.  LUNGS:  Bilateral wheezes and bibasilar rales.  HEART:  Regular with occasional ectopy with a grade 1 systolic murmur.  ABDOMEN:  Obese, soft, nontender.  No hepatosplenomegaly.  EXTREMITIES:  No cyanosis, clubbing or edema.  NEURO:  No focal weakness.   LABORATORY DATA:  A chest x-ray reveals emphysema and bibasilar  atelectasis.  He has an ill-defined 3 cm density in the right midlung.  White count 7.1, hemoglobin 13.9, platelets 324,000, segs 73, lymphs 20.  Sodium 141, potassium 3.8, glucose 94, BUN 13, creatinine 0.56, SGOT 26,  calcium 9.9.  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   IMPRESSION:  1. Acute exacerbation of chronic bronchitis versus early pneumonia.      Treat with intravenous Solu-Medrol, Ventolin and Atrovent nebulizer      treatments and intravenous Rocephin and Zithromax.  He was really      tight initially with marked wheezing.  He also had rales on exam      suggestive of pneumonia.  His x-ray though shows more a  picture of      atelectasis.  2. Possible 3 cm lung mass.  We will evaluate further with a CT scan.  3. Chronic anticoagulation.  His followup INR is 2.0.  4. Coronary artery disease.  5. History of tuberculosis.  6.  Diabetes.  Add sliding scale NovoLog.  7. Gastroesophageal reflux disease.  8. Hyperlipidemia.  9. History of pulmonary emboli.      Kingsley Callander. Ouida Sills, MD  Electronically Signed     ROF/MEDQ  D:  07/06/2009  T:  07/06/2009  Job:  [id number]   cc:   Ramon Dredge L. Juanetta Gosling, M.D.  Fax: [phone]

## 2011-05-01 NOTE — Group Therapy Note (Signed)
NAME:  VALIN, MASSIE               ACCOUNT NO.:  [id number]   MEDICAL RECORD NO.:  [id number]          PATIENT TYPE:  INP   LOCATION:  A226                          FACILITY:  [facility]   PHYSICIAN:  Edward L. Juanetta Gosling, M.D.DATE OF BIRTH:  Nov 20, 1935   DATE OF PROCEDURE:  02/29/2008  DATE OF DISCHARGE:                                 PROGRESS NOTE   Mr. Marcott is about the same.  He continues to cough up some blood,  although the hemoptysis is certainly not what could be considered  massive.  He has no other complaints.  Says he does not really feel  sick.  He is still coughing some.  He is mildly short of breath, but not  much.   PHYSICAL EXAMINATION:  VITAL SIGNS:  His temperature is 98, pulse is 68,  respirations 20, blood pressure 108/65, then later 98/47 while he was  asleep.  Blood sugar was 138.  CHEST:  Still shows some rhonchi, more on the right than on the left.  HEART:  Regular, without gallop.  ABDOMEN:  Soft.  No masses felt.  Bowel sounds are active.   LABORATORY WORK:  Shows his white count is 6500, hemoglobin is 13,  hematocrit 38, platelets 275.  His INR is 1.6 this morning.   ASSESSMENT:  He has hemoptysis, this in the face of someone who is on  anticoagulation.   PLAN:  To hold his anticoagulants again today.  Continue with his  antibiotics.  Continue with his inhaled bronchodilators.  As his  hemoptysis decreases, I will be able to get him back on anticoagulation,  but at this point I do not want to turn what is relatively low-volume  hemoptysis into much higher-volume hemoptysis.  Will plan to continue  his other treatments and follow.      Edward L. Juanetta Gosling, M.D.  Electronically Signed     ELH/MEDQ  D:  02/29/2008  T:  02/29/2008  Job:  [id number]

## 2011-05-01 NOTE — Group Therapy Note (Signed)
NAME:  Caleb [name], Caleb [name] NO.:  [id number]   MEDICAL RECORD NO.:  [id number]          PATIENT TYPE:  INP   LOCATION:  A309                          FACILITY:  [facility]   PHYSICIAN:  Edward L. Juanetta Gosling, M.D.DATE OF BIRTH:  Jan 27, 1935   DATE OF PROCEDURE:  DATE OF DISCHARGE:                                 PROGRESS NOTE   Caleb [name] is overall about the same.  He says he is not having  anymore no nausea or vomiting.  He is not having as much problems with  cough or congestion or shortness of breath and basically feels better.   His physical examination this morning shows his temperature is 97, pulse  79, respirations 20, blood pressure 106/55, O2 sats 95% on 2 L.  His  chest is clearer.  He is still somewhat short of breath, but I do not  think is any different than it was.   His laboratory work this morning, CBC shows white count 7700, hemoglobin  11.7, and platelets 230.  His electrolytes are normal.  PT still not  therapeutic at 1.8 INR.  His chest is clearer than before as mentioned.  He does seem a bit dyspneic still.   My plan is to continue with his meds and treatments.  No changes today  and I think he is perhaps could be discharged tomorrow.      Edward L. Juanetta Gosling, M.D.  Electronically Signed     ELH/MEDQ  D:  03/12/2009  T:  03/12/2009  Job:  [id number]

## 2011-05-01 NOTE — H&P (Signed)
NAME:  PRAKASH, KIMBERLING               ACCOUNT NO.:  [id number]   MEDICAL RECORD NO.:  [id number]          PATIENT TYPE:  INP   LOCATION:  A226                          FACILITY:  [facility]   PHYSICIAN:  Edward L. Juanetta Gosling, M.D.DATE OF BIRTH:  [birth year]   DATE OF ADMISSION:  02/27/2008  DATE OF DISCHARGE:  LH                              HISTORY & PHYSICAL   REASON FOR ADMISSION:  Hemoptysis.   HISTORY:  Caleb [name] is a 76 year old who has multiple medical  problems including COPD, a history of tuberculosis in the past and  pulmonary emboli.  He started coughing up blood on the day prior to  admission.  He said it was fairly gradual, but he had coughed up  significant amounts of blood, he was concerned about it and came to the  emergency room.  In the emergency room, he was found to have frank  hemoptysis with dark and bright red blood.  He has not had any chest  pain.  He is not short of breath.  He has not noticed any fever.   PAST MEDICAL HISTORY:  1. Positive for a history of tuberculosis in the past.  2. COPD.  3. Coronary artery occlusive disease.  4. Hypertension.  5. Diabetes.  6. Peptic ulcer disease.  7. Hyperlipidemia.  8. Previous pulmonary emboli.   FAMILY HISTORY:  Positive for coronary disease and hypertension.   SOCIAL HISTORY:  He is an ex-cigarette smoker.  He does not drink any  alcohol.  Does not use any illicit drugs.   HOME MEDICATIONS:  1. Simvastatin 40 mg daily.  2. Tramadol with acetaminophen 50/250 4 times a day.  3. Lisinopril 10 mg daily.  4. Glyburide 5 mg daily.  5. Omeprazole 20 mg daily.  6. Metoclopramide 10 mg at bedtime.  7. Proscar 5 mg daily.  8. Gemfibrozil 600 mg b.i.d.  9. Vitamin C 500 mg daily.  10.Oxybutynin 5 mg daily.  11.Felodipine 5 mg daily.  12.Metformin 500 mg b.i.d.  13.Coumadin 5 mg daily.  14.Diltiazem CD 240 daily.  15.Nitroglycerin patch.   REVIEW OF SYSTEMS:  Otherwise pretty much negative.  He says he feels  okay.   PHYSICAL EXAMINATION:  VITAL SIGNS:  Blood pressure 122/80, pulse 80,  respirations are 16.  He is afebrile.  HEENT:  Pupils are reactive to  light and accommodation.  NECK:  Supple.  CHEST:  Shows some rhonchi and wheezing.  No rales.  HEART:  Regular without gallop.  ABDOMEN:  Soft.  No masses.  He has bowel sounds are present and active.  EXTREMITIES:  Showed no edema.  CENTRAL NERVOUS SYSTEM:  Grossly intact.   LABORATORY WORK:  White count is 9000, hemoglobin 14.1, platelets 295,  PT 25.8 with an INR of 2.2. Complete metabolic profile:  Sodium 138,  potassium 3.5, chloride 104, CO2 of 27, BUN 13, creatinine 0.68.  His  liver functions are normal, albumin 3.6.   DIAGNOSTICS:  1. EKG:  Normal sinus rhythm.  2. His chest x-ray shows no definite infiltrate.   ASSESSMENT:  He has hemoptysis, probably related to dry  bronchiectasis  from his previous tuberculosis.   PLAN:  Treat him with antibiotics, steroids, nebulizer treatments.  I am  going to hold his Coumadin for the moment and then follow.      Edward L. Juanetta Gosling, M.D.  Electronically Signed     ELH/MEDQ  D:  02/28/2008  T:  02/28/2008  Job:  [id number]

## 2011-05-01 NOTE — Assessment & Plan Note (Signed)
[facility]                       [place] CARDIOLOGY OFFICE NOTE   AYMEN, WIDRIG                      MRN:          [id number]  DATE:04/22/2007                            DOB:          06/12/1935    HISTORY OF PRESENT ILLNESS:  Caleb [name] returns today for follow up.  He has  chronic atrial fibrillation and is on Coumadin.   From a heart perspective, he has been stable.  He has not noted any  significant palpitations, TIA's or CVA's.  The patient's Coumadin needs  to be checked today.  He has a history of GI bleeding with  diverticulitis but has not noted any melena or change in his bowel  habits.   The patient has normal LV function.  His last cath in June 2007 showed a  left dominant circulation without critical disease.  He does have  biatrial enlargement.   The patient's cardiac history is otherwise fairly unremarkable.   REVIEW OF SYSTEMS:  Remarkable.  However, he has felt ill over the last  few days.  He feels as if he has a sinus infection.  He has been  congested.  He had a bloody nose three days ago requiring packing and  holding a dose of Coumadin on his own.  He has been having some right  lower quadrant pain.  There has been a little bit of nausea, but no  vomiting and no diarrhea.  He has not had any fevers.   His review of systems, otherwise, negative.   CURRENT MEDICATIONS:  1. Gemfibrozil 600 mg b.i.d.  2. Vitamin C.  3. Warfarin 5 mg as directed.  4. Claritin 10 mg daily.  5. Glyburide 5 mg b.i.d.  6. Cardizem 240 mg daily.  7. Felodipine 5 mg daily.  8. Omeprazole 20 b.i.d.  9. Tramadol 50 mg b.i.d.  10.Finasteride 5 mg daily.  11.Synthroid 150 mcg daily.  12.Lisinopril 5 mg daily.  13.Metoclopramide 10 mg daily.  14.Nitroglycerin p.r.n.  15.Simvastatin 40 q.h.s.  16.Aspirin daily.   PHYSICAL EXAMINATION:  GENERAL:  An elderly appearing male who looks  chronically ill.  VITAL SIGNS:  He is in atrial  fibrillation at a rate of 80.  He is  afebrile.  His weight is 207 pounds.  Blood pressure 130/70, weight  stable compared to previous visit.  Respiratory rate is 18-19.  HEENT:  Normal.  There is no sinus tenderness.  Carotids are normal  without bruits.  There is no lymphadenopathy.  No JVP elevation.  No  carotid bruits.  LUNGS:  Rhonchi throughout with mild expiratory wheezes.  There is an S1  and S1with a systolic ejection murmur.  PMI is not palpable.  ABDOMEN:  Protuberant.  He is status post appendectomy and  cholecystectomy.  He has mild right lower quadrant pain with no rebound.  Bowel sounds are positive.  There are no masses.  There is no  hepatosplenomegaly.  No hepatojugular reflux.  Distal pulses are intact  with no edema.  There is no lymphadenopathy.  NEUROLOGICAL:  Nonfocal.  SKIN:  Warm and dry.  He has significant psoriasis on  the back of his  neck and on the anterior part of his chest.   IMPRESSION:  The patient's atrial fibrillation is stable.  We will check  his Coumadin level today, particularly since he will be started on  antibiotics.  His rate control is fine on calcium blockers.   He denies significant coronary disease and has good LV function.  Of  more concern is his apparent URI or sinus infection as well as some  right lower quadrant pain in the setting of previous diverticulitis.   He will have a CBC done today as well as LFT's, amylase and lipase.  We  will check a chest x-ray.   I will give him a prescription for Avelox 400 mg daily.  He will need to  have his Coumadin rechecked this time next week on antibiotics.  We will  try to get him in to see Dr. Juanetta Gosling later this week to follow up his  abdominal pain and respiratory status.   In regards to his cardiac condition, we will see him back in six months.   Overall, the patient's heart is table.  However, he appears to be having  problems in regards to his lungs and possible bowels.  Given the  fact  that he is on Coumadin, we may have to watch him closely.   It may also be worthwhile to try to find him some guaiac cards.     Noralyn Pick. Eden Emms, MD, [facility]  Electronically Signed    PCN/MedQ  DD: 04/22/2007  DT: 04/22/2007  Job #: [id number]   cc:   Ramon Dredge L. Juanetta Gosling, M.D.

## 2011-05-01 NOTE — Discharge Summary (Signed)
NAME:  Caleb [name], Caleb [name] NO.:  [id number]   MEDICAL RECORD NO.:  [id number]          PATIENT TYPE:  INP   LOCATION:  A309                          FACILITY:  [facility]   PHYSICIAN:  Edward L. Juanetta Gosling, M.D.DATE OF BIRTH:  1935-10-03   DATE OF ADMISSION:  03/10/2009  DATE OF DISCHARGE:  03/28/2010LH                               DISCHARGE SUMMARY   FINAL DISCHARGE DIAGNOSES:  1. Pneumonia.  2. Probable viral gastroenteritis.  3. Coronary artery occlusive disease.  4. Chronic obstructive pulmonary disease.  5. Diabetes.  6. Gastroesophageal reflux disease.  7. Remote history of tuberculosis.  8. History of peptic ulcer disease.  9. Hyperlipidemia.  10.History of pulmonary emboli.  11.Benign prostatic hypertrophy.   HISTORY:  Caleb [name] is a 76 year old who came to the emergency room  with complaints of vomiting.  He started having problems about 3 days  prior to admission with headache, chills, which got worse and then he  started vomiting.  Chest x-ray was read as pneumonia versus atelectasis.   His physical exam on admission showed a well-developed, well-nourished  male in no acute distress.  Although, when he came to the emergency  room, he was in some distress.  His temperature 99.3, pulse 82,  respirations 18, blood pressure 113/55, and O2 sat was 93%.  His chest  with some rhonchi.  Heart was regular without gallop.  His abdomen was  soft.   His white blood count was 11,900.   HOSPITAL COURSE:  He was treated with antibiotics, Zofran for nausea,  which he did not need very much.  His Coumadin level was monitored and  he improved.  At the time of discharge, he was back to baseline, and he  was discharged home on his previous medications which are;  1. Simvastatin 40 mg daily.  2. Tramadol 50 mg 4 times a day.  3. Lisinopril 10 mg daily.  4. Glyburide 5 mg daily.  5. Omeprazole 20 mg daily.  6. Metoclopramide 10 mg at bedtime.  7. Proscar 5 mg daily.  8. Gemfibrozil 600 mg b.i.d.  9. Vitamin C 500 mg daily.  10.Oxybutynin 5 mg daily.  11.Felodipine 5 mg daily.  12.Metformin 500 mg b.i.d.  13.Coumadin 5 mg daily.  14.Diltiazem 240 mg daily.  15.Ventolin inhaler as needed.  16.Septra DS, he has got about 2 more weeks of that.  17.Keflex 500 mg 3 times a day for 1 week.   He will continue monitoring his Coumadin level at the [facility], and he will follow up in my office within about 2 weeks.      Edward L. Juanetta Gosling, M.D.  Electronically Signed     ELH/MEDQ  D:  03/13/2009  T:  03/13/2009  Job:  [id number]

## 2011-05-01 NOTE — Group Therapy Note (Signed)
NAME:  TAL, KEMPKER               ACCOUNT NO.:  [id number]   MEDICAL RECORD NO.:  [id number]          PATIENT TYPE:  INP   LOCATION:  A226                          FACILITY:  [facility]   PHYSICIAN:  Edward L. Juanetta Gosling, M.D.DATE OF BIRTH:  1935/09/20   DATE OF PROCEDURE:  DATE OF DISCHARGE:                                 PROGRESS NOTE   Mr. Fife had done well until about 10 o'clock a.m. and then he  started coughing up blood again.  He is still coughing up some although  it stopped again as of about 2 o'clock.  It is now about 6:20.  I am  concerned about this and do not want to let him go home yet.  I am going  to keep him tonight and see how he does.      Edward L. Juanetta Gosling, M.D.  Electronically Signed     ELH/MEDQ  D:  03/01/2008  T:  03/02/2008  Job:  [id number]

## 2011-05-01 NOTE — Discharge Summary (Signed)
NAME:  Caleb [name], Caleb [name] NO.:  [id number]   MEDICAL RECORD NO.:  [id number]          PATIENT TYPE:  INP   LOCATION:  A305                          FACILITY:  [facility]   PHYSICIAN:  Kingsley Callander. Ouida Sills, MD       DATE OF BIRTH:  [birth year]   DATE OF ADMISSION:  07/05/2009  DATE OF DISCHARGE:  07/22/2010LH                               DISCHARGE SUMMARY   DISCHARGE DIAGNOSES:  1. Acute exacerbation of chronic bronchitis.  2. History of tuberculosis.  3. Coronary artery disease.  4. Diabetes.  5. History of pulmonary emboli.  6. Hyperlipidemia.  7. Benign prostatic hypertrophy.   HOSPITAL COURSE:  This patient is a 76 year old male who presented with  increased difficulty breathing with wheezing and coughing up discolored  sputum.  He was initially afebrile.  He was quite dyspneic though with  coughing.  His chest x-ray revealed emphysema and bibasilar atelectasis  with an ill-defined 3 cm density in the right midlung.  His white count  was 7.1.  He was started on Rocephin and Zithromax along with Ventolin  and Atrovent nebulizer treatments and IV Solu-Medrol.  His wheezing  responded well.  He was significantly improved by the evening of the  22nd and wanted to go home.  He underwent a CT scan of the chest to  evaluate his 3 cm right lung density, this revealed scarring with no  evidence of tumor.   He is anticoagulated with Coumadin.  His INR was 2.0 on admission and is  3.0 on discharge.   Blood cultures are thus far negative.  Sputum gram stain revealed  moderate white cells and no organisms.  His culture is pending.  He is  being discharged on Ceftin.   His diabetes was treated with glyburide and NovoLog.  Metformin was  held.  Following his contrast with his CT, he will restart it tomorrow.  He will notify the [facility] in regard to his antibiotic addition.   DISCHARGE MEDICATIONS:  1. Ceftin 500 mg b.i.d. for 7 days.  2. Prednisone 10 mg 4 a day for three  days, 3 a day for three days, 2      a day for three days, and 1 a day for three days.  3. Coumadin 5/2.5 mg every other day.  4. Simvastatin 40 mg daily.  5. Tramadol 50 mg four times daily p.r.n.  6. Lisinopril 10 mg daily.  7. Glyburide 5 mg daily.  8. Omeprazole 20 mg daily.  9. Metoclopramide 10 mg nightly.  10.Proscar 5 mg daily.  11.Gemfibrozil 600 mg b.i.d.  12.Vitamin C 500 mg daily.  13.Oxybutynin 5 mg daily.  14.Felodipine 5 mg daily.  15.Metformin 500 mg b.i.d. starting again tomorrow.  16.Diltiazem CD 240 mg daily.  17.Ventolin inhaler 2 puffs every 4 hours as needed.      Kingsley Callander. Ouida Sills, MD  Electronically Signed     ROF/MEDQ  D:  07/07/2009  T:  07/08/2009  Job:  [id number]   cc:   Ramon Dredge L. Juanetta Gosling, M.D.  Fax: [phone]

## 2011-05-01 NOTE — Group Therapy Note (Signed)
NAME:  Caleb [name], Caleb [name]               ACCOUNT NO.:  [id number]   MEDICAL RECORD NO.:  [id number]          PATIENT TYPE:  INP   LOCATION:  A226                          FACILITY:  [facility]   PHYSICIAN:  Edward L. Juanetta Gosling, M.D.DATE OF BIRTH:  [birth year]   DATE OF PROCEDURE:  DATE OF DISCHARGE:                                 PROGRESS NOTE   PROBLEM:  Hemoptysis.   SUBJECTIVE:  [name] says he feels better.  His hemoptysis seems to  have stopped.  He has no other new complaints.  He says he is not short  of breath.  He has had no chest pain.   PHYSICAL EXAM:  VITALS:  Temperature is 97.3, pulse 72 and irregular,  respirations 20, blood pressure 113/61, O2 sats 97% on 2 liters.  CHEST:  Shows decreased breath sounds, less rhonchi.  HEART:  Irregular.  ABDOMEN:  Soft.   LABORATORY DATA:  His Coumadin has been held since he has been admitted  and his PT/INR today is 15.4, 1.2 respectively.  White count 6800,  hemoglobin 13.4, platelets 268.   ASSESSMENT:  He is better.   PLAN:  I am going to get him up and move around and make sure that he  does not have bloody sputum that will come back up but otherwise I am  going to plan to discharge him later today.      Edward L. Juanetta Gosling, M.D.  Electronically Signed     ELH/MEDQ  D:  03/01/2008  T:  03/01/2008  Job:  [id number]

## 2011-05-01 NOTE — Group Therapy Note (Signed)
NAME:  TEE, RICHESON NO.:  [id number]   MEDICAL RECORD NO.:  [id number]          PATIENT TYPE:  INP   LOCATION:  A309                          FACILITY:  [facility]   PHYSICIAN:  Edward L. Juanetta Gosling, M.D.DATE OF BIRTH:  01-15-1935   DATE OF PROCEDURE:  DATE OF DISCHARGE:  03/13/2009                                 PROGRESS NOTE   Mr. Finigan was admitted with potential pneumonia versus viral  gastroenteritis, and he states that he is feeling better.  He has not  had any fever for the last 24-48 hours.  He states that he is not  coughing much.  He feels better.  He is not getting more nausea.   His physical exam shows that his temperature 97.2, pulse 73,  respirations 20, blood pressure 103/55, and O2 sat is 96% on 2 L.  His  chest is clear.  His heart is regular.  His abdomen is soft.  He looks  much better.   ASSESSMENT:  He is improved.   PLAN:  Continue his treatments, but I am going to go ahead and discharge  him home today.  Please see discharge summary for details.      Edward L. Juanetta Gosling, M.D.  Electronically Signed     ELH/MEDQ  D:  03/13/2009  T:  03/13/2009  Job:  [id number]

## 2011-05-01 NOTE — H&P (Signed)
NAME:  Caleb [name], Caleb [name] NO.:  [id number]   MEDICAL RECORD NO.:  [id number]          PATIENT TYPE:  INP   LOCATION:  A309                          FACILITY:  [facility]   PHYSICIAN:  Edward L. Juanetta Gosling, M.D.DATE OF BIRTH:  [birth year]   DATE OF ADMISSION:  03/10/2009  DATE OF DISCHARGE:  LH                              HISTORY & PHYSICAL   Caleb [name] is a 76 year old who came to the emergency room with  complaints of vomiting.  He says that he started having problems about  March 20 with headache and chills, and his headache and chills got worse  as the day went on to the point that he started having vomiting.  He has  vomited several times, and when he came to the emergency room he was  noted to be acutely ill.  Chest x-ray was read as consistent with  pneumonia versus less likely atelectasis, and he is being treated for  atelectasis.  He has not had any fever as far as he knows, although he  has not checked his temperature.  He has not been coughing very much.  He has not had any chest pain.  He has not had any diarrhea.   PAST MEDICAL HISTORY:  Positive for:   1. Coronary disease.  2. COPD.  3. Diabetes.  4. Acid reflux disease.  5. Remote history of TB.  6. History of peptic ulcer disease.  7. Hyperlipidemia.  8. Previous pulmonary emboli.   FAMILY HISTORY:  Positive for coronary disease and for hypertension.   SOCIAL HISTORY:  He is married.  Lives at home with his wife.  He has  not smoked cigarettes in about 2 years.  He does not use any alcohol or  illicit drugs.   He has no known drug allergies.   MEDICATIONS:  1. Simvastatin 40 mg daily.  2. Tramadol 50 mg q.i.d.  3. Lisinopril 10 mg daily.  4. Glyburide 5 mg daily.  5. Omeprazole 20 mg daily.  6. Metoclopramide 10 mg at bedtime.  7. Proscar 5 mg daily.  8. Gemfibrozil 600 mg b.i.d.  9. Vitamin C 500 mg daily.  10.Oxybutynin 5 mg daily.  11.Felodipine 5 mg daily.  12.Metformin 500 mg b.i.d.  13.Coumadin 5 mg daily.  14.Diltiazem 240 mg daily.  15.He uses an inhaler.  16.He has been on Septra DS twice a day for chronic sinus infection.   PHYSICAL EXAMINATION:  Shows a well-developed, well-nourished male who  does not appear to be in any acute distress now.  Temperature 99.3, pulse 82, respirations 18, blood pressure 113/55, O2  sats 93% on 2 liters.  His pupils are reactive.  His mucous membranes are slightly dry.  NECK:  Supple without masses.  CHEST:  Shows some rhonchi.  HEART:  Regular without gallop.  ABDOMEN:  Soft, no masses are felt.  EXTREMITIES:  Showed no edema.   LABORATORY WORK:  White count 11,900, hemoglobin 13.9, platelets 249.  PT __________ INR of 1.8.  BMET normal.  Urine is normal.  Chest x-ray  as mentioned read as potential pneumonia versus atelectasis.  PLAN:  To treat him with Zofran.  He is going to stay on the antibiotics  as before and follow.      Edward L. Juanetta Gosling, M.D.  Electronically Signed     ELH/MEDQ  D:  03/11/2009  T:  03/11/2009  Job:  [id number]

## 2011-05-02 ENCOUNTER — Ambulatory Visit (INDEPENDENT_AMBULATORY_CARE_PROVIDER_SITE_OTHER): Payer: PRIVATE HEALTH INSURANCE | Admitting: *Deleted

## 2011-05-02 ENCOUNTER — Encounter: Payer: PRIVATE HEALTH INSURANCE | Admitting: *Deleted

## 2011-05-02 DIAGNOSIS — I4891 Unspecified atrial fibrillation: Secondary | ICD-10-CM

## 2011-05-04 NOTE — H&P (Signed)
NAME:  Caleb [name], Caleb [name]               ACCOUNT NO.:  [id number]   MEDICAL RECORD NO.:  [id number]          PATIENT TYPE:  AMB   LOCATION:                                FACILITY:  [facility]   PHYSICIAN:  Lionel December, M.D.    DATE OF BIRTH:  1935/10/07   DATE OF ADMISSION:  DATE OF DISCHARGE:  LH                                HISTORY & PHYSICAL   OFFICE NOTE:   PRESENTING COMPLAINT:  Follow up for biliary leak.   HISTORY OF PRESENT ILLNESS:  Caleb [name] is a 76 year old Caucasian male who had  open cholecystectomy by Dr. Elpidio Anis on September 14, 2005.  He had a  gangrenous gallbladder which was very friable.  JP drains were left.  He was  having significant drainage, which was mainly bilious.  Therefore, ERCP was  performed on September 21, 2005 with placement of a single 10 French stent.  He  had a cystic duct remnant leak.  He had a transient drop in JP output, but  then it picked up.  He, therefore, had repeat ERCP on September 26, 2005.  His  stent was in good position, but it had completely occluded.  This stent was  therefore removed.  A small sphincterotomy was performed with placement of  two 10 French 5 cm long stents.  There was a dramatic drop in bile output  via JP drain.  The patient had to be in the hospital for a few days because  of his breathing problems and finally went home.  He saw Dr. Katrinka Blazing once, and  he has an appointment to see him again.  He has been keeping output records  from both the JP drains.  It has been a few cc each day; only on one day  about 2 weeks ago he had 75 cc.  He says most of the liquid is bloody.  He  feels well.  He has a good appetite.  He states he has some soreness when he  moves around.  He has not had any drainage from around the JP site.  His  wife says that one of the containers has an odor.  He has not had fever,  chills, nausea, or vomiting.   MEDICATIONS:  1.  He is on AcipHex 20 mg daily.  2.  DuoNeb t.i.d. as needed.  3.  Etodolac 400 mg  b.i.d.  4.  Felodipine 5 mg daily.  5.  Gemfibrozil 600 mg b.i.d.  6.  Lisinopril 10 mg daily.  7.  Loratadine 10 mg daily.  8.  Metformin 500 mg b.i.d.  9.  Reglan 10 mg b.i.d. p.r.n.  10. Niacin 750 mg daily.  11. Oxybutynin 5 mg daily.  12. Phenergan 25 mg q.i.d. p.r.n.  13. Proscar 5 mg daily.  14. Senna three tablets daily.  15. Simvastatin 40 mg daily.  16. Synthroid 300 mcg daily.  17. Tylox 5/500 q.4h. p.r.n.  18. Vitamin C 500 mg daily.   PAST MEDICAL HISTORY:  1.  Hypertension.  2.  Diabetes mellitus.  3.  Hyperlipidemia.  4.  COPD.  5.  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  6.  He has chronic GERD.  7.  Low back pain.  8.  Osteoarthrosis.  9.  History of diverticulitis.  10. Colonic polyps.  11. Psoriasis.   His initial exam was by me several years ago, but subsequent exams have been  through the [facility]; the last one was less than 3 years ago.  He has had  bilateral inguinal herniorrhaphy and recent open cholecystectomy, as above.   ALLERGIES:  No known drug allergies.   OBJECTIVE:  VITAL SIGNS:  Weight 189 pounds.  He is 5 feet, 11 inches tall.  Pulse 92 per minute, blood pressure 126/70, temperature 97.7.  HEENT:  Conjunctivae pink.  Sclerae nonicteric.  Oropharyngeal mucosa is  normal.  He is edentulous in the upper jaw, and has a few teeth in the lower  jaw.  NECK:  No neck masses are noted.  CARDIAC:  Regular rhythm.  Normal S1 and S2.  No murmur noted.  LUNGS:  He has a few crackles at the right base.  ABDOMEN:  Protuberant.  He has 2 JP drains in position.  There is skin  erythema at the JP entry site.  One drain has a scant amount of dark blood;  the other one has a small amount of amber-colored fluid.  His abdomen is  otherwise soft and nontender without organomegaly or masses.  He does not  have peripheral edema or clubbing.   ASSESSMENT:  Caleb [name] had biliary stenting over a month ago for a cystic duct  remnant leak.  The first  stent got occluded within a day or two of being  placed and had to be exchanged with 2 stents.  He is having a scant amount  of drainage.  This just may be serous and some blood, and I feel he is ready  to have these drains removed.  He had odor to one of the JP drains.  I will  discuss with Dr. Katrinka Blazing whether or not he needs to be on an antibiotic.   PLAN:  The patient was given a prescription for Levaquin 500 mg daily for 1  week, but asked not to fill it until he has heard from me.   I will talk with Dr. Katrinka Blazing.   As soon as the JP drains are removed, a prescription given for Levaquin 500  mg daily for 7 days, but he will not take it unless he has a fever or he has  heard from me or Dr. Katrinka Blazing.   I will plan to remove both biliary stents about a week after he has had his  drains removed, which I hope would be later this month.      Lionel December, M.D.  Electronically Signed     NR/MEDQ  D:  11/01/2005  T:  11/02/2005  Job:  [id number]

## 2011-05-04 NOTE — Group Therapy Note (Signed)
NAME:  Caleb [name], Caleb [name]               ACCOUNT NO.:  [id number]   MEDICAL RECORD NO.:  [id number]          PATIENT TYPE:  INP   LOCATION:  A310                          FACILITY:  [facility]   PHYSICIAN:  Edward L. Juanetta Gosling, M.D.DATE OF BIRTH:  [birth year]   DATE OF PROCEDURE:  DATE OF DISCHARGE:                                   PROGRESS NOTE   PROBLEM:  COPD, coronary disease.   SUBJECTIVE:  [name] is overall doing fairly well. He has much less  drainage from his abdominal wound drain and seems to be doing better in  general.  \  His physical examination today shows his temp is 97.5, pulse 66,  respirations 20, blood sugar is 89, blood pressure 116/61. O2 sat is 91% on  2 liters. His chest is clear. His heart is regular. He does have less  drainage.   ASSESSMENT:  He is improved.   Plan is to continue with his treatments and medications. He is hopeful of  being able to go home today. I will leave that to Drs. Smith and [employer]      Edward L. Juanetta Gosling, M.D.  Electronically Signed     ELH/MEDQ  D:  09/27/2005  T:  09/27/2005  Job:  [id number]

## 2011-05-04 NOTE — Group Therapy Note (Signed)
NAME:  DEMARKO, ZEIMET NO.:  [id number]   MEDICAL RECORD NO.:  [id number]           PATIENT TYPE:   LOCATION:                                 FACILITY:   PHYSICIAN:  Edward L. Juanetta Gosling, M.D.     DATE OF BIRTH:   DATE OF PROCEDURE:  09/22/2005  DATE OF DISCHARGE:                                   PROGRESS NOTE   Problem is COPD, coronary disease status post cholecystectomy for gangrenous  gallbladder.   SUBJECTIVE:  Mr. Witucki says he is feeling okay. He had ERCP and stenting  done yesterday. He still has some drainage into his JP drain. He says he is  feeling okay. He still seems somewhat weak but I do not know his baseline  since I have not seen him in several years. He is still coughing up some  greenish sputum.   His physical examination today shows temperature is 97.9, pulse 71,  respirations 20, blood sugar 121, blood pressure 128/75, O2 sats 95% on 2  liters. His chest fairly clear with some rhonchi. His heart is regular.  Abdomen soft.   ASSESSMENT:  He is about the same as yesterday. Hopefully the stenting will  divert some of the biliary drainage. When he is discharge, he is going to  require a hospital bed, nebulizer and O2.      Edward L. Juanetta Gosling, M.D.  Electronically Signed     ELH/MEDQ  D:  09/22/2005  T:  09/22/2005  Job:  [id number]

## 2011-05-04 NOTE — Discharge Summary (Signed)
NAME:  Caleb [name], Caleb [name]               ACCOUNT NO.:  [id number]   MEDICAL RECORD NO.:  [id number]          PATIENT TYPE:  INP   LOCATION:  A310                          FACILITY:  [facility]   PHYSICIAN:  Edward L. Juanetta Gosling, M.D.DATE OF BIRTH:  July 14, 1935   DATE OF ADMISSION:  09/14/2005  DATE OF DISCHARGE:  LH                                 DISCHARGE SUMMARY   SUBJECTIVE:  Caleb [name] is a patient who I saw in my office with what  appeared to be a cholecystitis.  He was sent to Dr. Katrinka Blazing for surgery and  underwent an open cholecystectomy yesterday.  He was found to have a  necrotic gallbladder with empyema of the gallbladder.  He says now he feels  well.  He has no complaints.  His other history is that he has had  epigastric pain and nausea.  He has had some sweating.  He has a previous  history of hypertension, diabetes, hyperlipidemia, gastroesophageal reflux  disease, atherosclerotic cardiac disease.  He has hyperlipidemia.  It is  unclear exactly what his situation with his previous history is because he  has actually been getting his care at the [facility].  I have not seen him  in my office until this episode for about five years, and I do not have any  of the records from the [place].   His medication list, though, is:  1.  Simvastatin 40 mg.  He says he takes 1-1/2 night.  2.  Tramadol 50 mg q.6h. p.r.n.  3.  Lisinopril 10 mg daily.  4.  Glyburide two in the morning and one p.m.  5.  Omeprazole 20 mg daily.  6.  Metoclopramide one at bedtime.  7.  Proscar 5 mg daily.  8.  Gemfibrozil 600 mg b.i.d.  9.  A laxative.  10. Oxybutynin 5 mg two daily.  11. Felodipine 5 mg daily.  12. Metformin 500 mg b.i.d.  13. Nitroglycerin transdermal patch.   He has previously had an umbilical hernia and an inguinal hernia repair.   FAMILY HISTORY:  Positive for cardiac disease, hypertension and kidney  disease.   SOCIAL HISTORY:  He does not smoke or drink or use any other drugs.   PHYSICAL  EXAMINATION:  GENERAL:  He is awake and alert and looks pretty  comfortable.  He says he is coughing and has no pain now.  VITAL SIGNS:  His temperature is 98.7, pulse 92, respirations 16, blood  sugar 155, blood pressure 108/58.  O2 saturation is 94% on 3 L.  HEENT:  His mucous membranes are moist.  His nose and throat are clear.  CHEST:  His chest is actually very clear today.  CARDIAC:  His heart is regular without gallop.  ABDOMEN:  Positive bowel sounds.  I did not palpate his wound.  EXTREMITIES:  Some chronic venous stasis changes.   White count 12,800, hemoglobin 12.9, platelets 464.  BMET normal except for  glucose of 146.   ASSESSMENT:  He seems to be doing pretty well post.   PLAN:  Continue with his orders to cough and deep  breathe.  I am going to go  ahead and order him an incentive spirometer.  I will be out of town  tomorrow.  Dr. Sudie Bailey is on call for me, but I did not ask Dr. Sudie Bailey to  round on Caleb [name].  If you need him to see Caleb [name], please page  him.      Edward L. Juanetta Gosling, M.D.  Electronically Signed     ELH/MEDQ  D:  09/15/2005  T:  09/15/2005  Job:  [id number]

## 2011-05-04 NOTE — Assessment & Plan Note (Signed)
[facility]                         [place] CARDIOLOGY OFFICE NOTE   JOSEDE, CICERO                      MRN:          [id number]  DATE:10/30/2006                            DOB:          07-Jul-1935    [name] is seen today as a new patient by me.  He has been followed by  Dr. Dorethea Clan in the past.  He has had atrial fibrillation on chronic Coumadin.  He has coronary artery disease primarily with diseased nondominant right.  The left system was fine by catheterization in June 2007.   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   He is otherwise fairly sedentary.  He is not complaining of any significant  chest pain.   This time of year his psoriasis usually flares up.   Apparently he has been on disability since 1999 for a massive diverticular  bleed.   The patient is followed by Dr. Juanetta Gosling for his diabetes as well.   Apparently he has been fine with this.  He is on numerous medications, which  are listed in the chart.  His Coumadin level is followed here in the clinic  and his INR was 2.3 today.   PHYSICAL EXAMINATION:  VITAL SIGNS:  He is in atrial fibrillation with a  rate of 80.  Rate control seems good.  Blood pressure is 110/72.  HEENT:  Normal.  NECK:  There is no thyromegaly.  There is no lymphadenopathy.  There are no  carotid bruits.  LUNGS:  Clear.  CARDIAC:  There is an S1, S2, with a systolic ejection murmur.  ABDOMEN:  Benign.  EXTREMITIES:  Lower extremities with intact pulses, no edema.  SKIN:  He has significant psoriasis over the knuckles on each hand and some  small areas on his chest.  He is wearing a nitroglycerin patch at 0.2 mg a  day.   IMPRESSION:  Stable atrial fibrillation, good rate control on Cardizem 240  mg, felodipine 5 mg.   The patient's anticoagulation is good.  His Coumadin will be checked on a  monthly basis.  He is not  having significant angina and only has disease in  a small nondominant right with a normal  ejection fraction.  I will see him back in 6 months.  He will continue to  follow up with Dr. Juanetta Gosling for his medical needs, including his diabetes.     Noralyn Pick. Eden Emms, MD, [facility]  Electronically Signed    PCN/MedQ  DD: 10/30/2006  DT: 10/30/2006  Job #: [id number]   cc:   Ramon Dredge L. Juanetta Gosling, M.D.

## 2011-05-04 NOTE — H&P (Signed)
NAME:  Caleb [name], Caleb [name]               ACCOUNT NO.:  [id number]   MEDICAL RECORD NO.:  [id number]          PATIENT TYPE:  AMB   LOCATION:  DAY                           FACILITY:  [facility]   PHYSICIAN:  Jerolyn Shin C. Katrinka Blazing, M.D.   DATE OF BIRTH:  Apr 21, 1935   DATE OF ADMISSION:  DATE OF DISCHARGE:  LH                                HISTORY & PHYSICAL   HISTORY OF PRESENT ILLNESS:  A 76 year old male with a history of recurrent  epigastric pain and nausea, but without vomiting.  The pain radiates through  to his back.  He has not had diarrhea.  He has difficulty with eating all  meals.  Ultrasound shows multiple gallstones.  He also has some gallbladder  wall thickening.  The patient is scheduled for a cholecystectomy.   PAST HISTORY:  1.  Hypertension.  2.  Diabetes mellitus.  3.  Hyperlipidemia.  4.  Gastroesophageal reflux disease.  5.  Low back pain.  6.  Seasonal allergies.  7.  Osteoarthritis.  8.  Atherosclerotic heart disease.  9.  Diverticulosis.   MEDICATIONS:  1.  Simvastatin 400 mg 1-1/2 nightly.  2.  Tramadol 50 mg one q.6h. p.r.n.  3.  Lisinopril 10 mg one daily.  4.  Glyburide 5 mg two q.a.m. and one nightly.  5.  Omeprazole 20 mg one daily.  6.  Metoclopramide one nightly.  7.  Proscar 5 mg daily.  8.  Gemfibrozil 600 mg twice daily.  9.  Sennalax one daily.  10. Vitamin C 500 mg daily.  11. Oxybutynin chloride 5 mg two daily.  12. Felodipine 5 mg daily.  13. Metformin 500 mg one twice daily.  14. Nitroglycerin patch transdermal one daily.   SURGERY:  1.  Umbilical hernia repair.  2.  Bilateral inguinal hernia repair.   FAMILY HISTORY:  Positive for atherosclerotic heart disease, hypertension,  kidney disease.   SOCIAL HISTORY:  The patient does not drink, smoke, or use drugs.  He is  married and retired.   PHYSICAL EXAMINATION:  VITAL SIGNS:  Blood pressure of 104/60, pulse 88,  respirations 20, weight 200 pounds.  HEENT:  Unremarkable.  NECK:  Supple  without JVD, bruit, adenopathy, or thyromegaly.  CHEST:  Clear to auscultation.  No rales, rubs, rhonchi, or wheezes.  HEART:  Grade III systolic ejection murmur.  No ectopy.  ABDOMEN:  Epigastric and right upper quadrant tenderness.  Normal bowel  sounds.  No masses.  EXTREMITIES:  No clubbing, cyanosis, or edema.  Good peripheral pulses.  No  joint deformity.   IMPRESSION:  1.  Cholelithiasis, cholecystitis.  2.  Hypertension.  3.  Diabetes mellitus.  4.  Hyperlipidemia.  5.  Gastroesophageal reflux disease.  6.  Chronic low back pain.  7.  Osteoarthritis.  8.  Atherosclerotic heart disease by history.  9.  Diverticulosis.  10. Benign prostatic hypertrophy.   PLAN:  The patient is scheduled for a laparoscopic cholecystectomy.      Dirk Dress. Katrinka Blazing, M.D.  Electronically Signed     LCS/MEDQ  D:  09/13/2005  T:  09/14/2005  Job:  [id number]

## 2011-05-04 NOTE — Group Therapy Note (Signed)
NAME:  Caleb [name], Caleb [name]               ACCOUNT NO.:  [id number]   MEDICAL RECORD NO.:  [id number]          PATIENT TYPE:  INP   LOCATION:  A225                          FACILITY:  [facility]   PHYSICIAN:  Edward L. Juanetta Gosling, M.D.DATE OF BIRTH:  Apr 23, 1935   DATE OF PROCEDURE:  DATE OF DISCHARGE:                                   PROGRESS NOTE   PROBLEMS:  Atrial fibrillation, hypothyroidism with over replacement, COPD,  atrial fibrillation, chest pain.   HISTORY:  [name] says that he feels much better.  He has no new  complaints.  His TSH level is very high, but that appears to be because he  was over replaced.  He was taking initially 300 mcg of Synthroid and then he  was breaking that in half and taking 150.  He will need some thyroid  replacement, but obviously it is going to have to be less than that.   This morning he remains in a sinus rhythm.  His heart rate about 90.  His O2  sat was 91% off oxygen.  His chest is fairly clear.  His abdomen is somewhat  protuberant.   He had a stress test done yesterday, and Dr. Dorethea Clan has told him that he  needs to have a cardiac catheterization done.  [name] would prefer to  have that done at the [facility], and we are working on that.  If there are  no beds available he would agree to go to [facility].  At any rate, he is  better.  Things seem to be improving, and I have asked that he continue with  his medications.  I am going to go ahead and prepare him for discharge in  case he is able to be transferred from here for cardiac catheterization.      Edward L. Juanetta Gosling, M.D.  Electronically Signed     ELH/MEDQ  D:  05/17/2006  T:  05/17/2006  Job:  [id number]

## 2011-05-04 NOTE — Procedures (Signed)
NAME:  Caleb [name], CUEVAS NO.:  [id number]   MEDICAL RECORD NO.:  [id number]          PATIENT TYPE:  INP   LOCATION:  IC06                          FACILITY:  [facility]   PHYSICIAN:  Vida Roller, M.D.   DATE OF BIRTH:  02/01/1935   DATE OF PROCEDURE:  05/15/2006  DATE OF DISCHARGE:                                  ECHOCARDIOGRAM   PRIMARY CARE PHYSICIAN:  Kari Baars, M.D.   TAPE NUMBER:  [id number].   TAPE COUNT:  [id number].   INDICATIONS FOR PROCEDURE:  This is a 76 year old male with atrial  fibrillation in the Intensive Care Unit with severe COPD.   TECHNICAL QUALITY:  Extremely difficult.   M-MODE TRACINGS:  Inaccurate.   2-D AND DOPPLER IMAGING:  The left ventricle appears to be normal size.  There is preserved LV systolic function. Estimated ejection fraction 55% to  65%. The patient is in atrial fibrillation throughout, so it is difficult to  assess wall motion characteristics. The RV appears to be mildly dilated.  There is depressed RV systolic function.   The left atrium, I think, is dilated. The right atrium appears to be  significantly dilated.   There is no obvious valvular heart disease.   There is no pericardial effusion.      Vida Roller, M.D.  Electronically Signed     JH/MEDQ  D:  05/15/2006  T:  05/15/2006  Job:  [id number]   cc:   Ramon Dredge L. Juanetta Gosling, M.D.  Fax: [phone]

## 2011-05-04 NOTE — Group Therapy Note (Signed)
NAME:  Caleb [name], Caleb [name]               ACCOUNT NO.:  [id number]   MEDICAL RECORD NO.:  [id number]          PATIENT TYPE:  INP   LOCATION:  A310                          FACILITY:  [facility]   PHYSICIAN:  Edward L. Juanetta Gosling, M.D.DATE OF BIRTH:  02-13-1935   DATE OF PROCEDURE:  DATE OF DISCHARGE:                                   PROGRESS NOTE   PROBLEM:  COPD, coronary artery occlusive disease, hypertension.   SUBJECTIVE:  Caleb [name] is overall doing fairly well. He has no new  complaints. He is still coughing, still coughing up some sputum.   His temp is 97.5, pulse 70, respirations 24, blood sugar 116, blood pressure  113/71. O2 sat is 96%. His chest is pretty clear. He is coughing up some  yellowish sputum. His heart is regular. His abdomen is soft.   ASSESSMENT:  He seems to be doing okay.   Plan is to continue with his medications and treatments. Discharge planning  per Dr. Katrinka Blazing.      Edward L. Juanetta Gosling, M.D.  Electronically Signed     ELH/MEDQ  D:  09/24/2005  T:  09/24/2005  Job:  [id number]

## 2011-05-04 NOTE — Group Therapy Note (Signed)
NAME:  Caleb [name], Caleb [name]               ACCOUNT NO.:  [id number]   MEDICAL RECORD NO.:  [id number]          PATIENT TYPE:  INP   LOCATION:  A310                          FACILITY:  [facility]   PHYSICIAN:  Edward L. Juanetta Gosling, M.D.DATE OF BIRTH:  12-Feb-1935   DATE OF PROCEDURE:  DATE OF DISCHARGE:                                   PROGRESS NOTE   PROBLEM:  Status post cholecystectomy.   SUBJECTIVE:  Caleb [name] says he is doing okay. He has no new complaints.  He says he is having some abdominal pain but not a great deal. He is  coughing, nonproductively.   His temperature is 98.1, pulse 68, respirations 19, blood sugar 114 - as  high as of 156, blood pressure 107/57. O2 sat is 92% on 3 liters. He is able  to do incentive spirometry to 750. His chest is fairly clear with decreased  breath sounds. His heart is regular. His abdomen is soft.   ASSESSMENT:  He is better.   PLAN:  Continue treatments. I have discussed his situation with Dr. Katrinka Blazing.      Edward L. Juanetta Gosling, M.D.  Electronically Signed     ELH/MEDQ  D:  09/17/2005  T:  09/17/2005  Job:  [id number]

## 2011-05-04 NOTE — Cardiovascular Report (Signed)
NAME:  Caleb [name], Caleb [name] NO.:  [id number]   MEDICAL RECORD NO.:  [id number]          PATIENT TYPE:  INP   LOCATION:  3742                         FACILITY:  [facility]   PHYSICIAN:  Vida Roller, M.D.   DATE OF BIRTH:  May 10, 1935   DATE OF PROCEDURE:  05/20/2006  DATE OF DISCHARGE:                              CARDIAC CATHETERIZATION   PRIMARY:  Dr. Shaune Pollack   HISTORY OF PRESENT ILLNESS:  Mr. Shaheen is a 76 year old man who has COPD  and atrial fibrillation who presented with atrial fibrillation with rapid  ventricular response.  Was found to have ST segment changes and chest  discomfort with his rapid atrial fibrillation.  He was admitted to [facility], ruled out for myocardial infarction.  He was evaluated with a  dobutamine Myoview which showed evidence of inferior ischemia and he was  referred for heart catheterization.   PROCEDURES PERFORMED:  1.  Right Heart Catheterization  2.  Left Heart Catheterization  3.  Selective coronary angiography  4.  Left Ventriculography   DETAILS OF THE PROCEDURE:  After obtaining informed consent the patient was  brought to the cardiac catheterization laboratory in the fasting state.  There was prepped and draped in the usual sterile manner and a local  anesthetic was obtained over the right groin using 1% lidocaine without  epinephrine.  The right femoral vein was cannulated with a 7-French 10 cm  sheath and Swan-Ganz catheterization was performed through the vein.  The  right femoral artery was cannulated using the modified Seldinger technique  with a 6-French 10 cm sheath and left heart catheterization was performed  using a 6-French Judkins left, a 6-French Judkins right, and a pigtail  catheter.  Both the left and the right were #4s.  At the conclusion of the  procedure the catheters were removed.  The patient was moved back to the  cardiology holding area where the femoral artery and femoral venous  sheaths  were removed.  Hemostasis was obtained using direct manual pressure.  At the  conclusion of the hold there was no evidence of ecchymosis or hematoma  formation and distal pulses were intact.  Total fluoroscopic time 12.1  minutes.  Total ionized contrast 125 mL.   RESULTS:  Right heart pressures:  Right atrial pressure A-wave 10, V-wave 8,  mean of 6 mmHg.   Right ventricular pressure 31/5 with an end-diastolic pressure of 8 mmHg.   Pulmonary artery pressure 25/10 with a mean of 17 mmHg.   Pulmonary capillary wedge pressure A-wave of 14, V-wave of 12, mean of 11  mmHg.   Aortic pressure 139/77, mean of 96 mmHg.   Left ventricular pressure 132/7 with an end-diastolic pressure of 14 mmHg.   Pulmonary artery saturation was 62%.  Aortic saturation was 89%.  Cardiac  output by the thermodilution method 4.6 with an index of 2.3 L per minute  per sq m.  Cardiac index of the Fick method 5 with an index of 2.5 L per  minute per sq m.   CORONARY ANGIOGRAPHY:  The left main coronary artery  is a large vessel which  is angiographically normal.   The left anterior descending coronary artery is a large artery which has  calcium from its proximal to mid portion.  There is a diagonal branch that  comes out from the calcium that has some luminal irregularities.  There are  two other diagonals which are moderate caliber and have luminal  irregularities with some ostial non-obstructive disease.   The circumflex coronary artery is a large dominant vessel with one moderate  caliber obtuse marginal, two posterior lateral branches which are moderate,  and a posterior descending coronary artery which is small and is diffusely  diseased, but does not appear to have significant obstructive disease.   The right coronary artery is a small nondominant vessel with a 99% proximal  stenosis and a 70% mid stenosis.   Left ventriculogram reveals ejection fraction of 60% with no obvious wall  motion  abnormalities.  There is 1+ mitral regurgitation.   ASSESSMENT:  1.  Left dominant circulation.  2.  Obstructive disease in a nondominant right coronary artery.  3.  Preserved left ventricular systolic function.  4.  Preserved right heart pressures.   PLAN:  Plan then would be medical therapy for this nondominant right.  Certainly the ischemia which is in the inferior wall is likely due to the  diffuse disease in the posterior descending distribution.  I do not believe  looking at the films that this is an appropriate target for intervention nor  do I think he has good surgical targets there.  His LV systolic function is  normal so I am going to lean towards using aggressive risk factor  modification and management.  The other issue is his atrial fibrillation.  He will require Coumadin.  As such, he will be anticoagulated.      Vida Roller, M.D.  Electronically Signed     JH/MEDQ  D:  05/20/2006  T:  05/20/2006  Job:  [id number]   cc:   Ramon Dredge L. Juanetta Gosling, M.D.  Fax: [phone]

## 2011-05-04 NOTE — Group Therapy Note (Signed)
NAME:  Caleb [name], Caleb [name]               ACCOUNT NO.:  [id number]   MEDICAL RECORD NO.:  [id number]          PATIENT TYPE:  INP   LOCATION:  A310                          FACILITY:  [facility]   PHYSICIAN:  Edward L. Juanetta Gosling, M.D.DATE OF BIRTH:  1934-12-24   DATE OF PROCEDURE:  09/18/2005  DATE OF DISCHARGE:                                   PROGRESS NOTE   PROBLEMS:  1.  Chronic obstructive pulmonary disease.  2.  Coronary artery occlusive disease.  3.  Status post open gallbladder surgery.   SUBJECTIVE:  Caleb [name] says he is feeling okay.  He is having some  trouble with his mouth and he does have some white patches on his tongue.   OBJECTIVE:  VITAL SIGNS:  His exam shows temperature is 98.2, pulse 78,  respirations 24, blood sugar 129, blood pressure 101/57, O2 saturations 89%,  but this is listed as being on room air.  LUNGS:  He is not taking significantly deep breaths.  He does have some  wheezes in his chest.   ASSESSMENT:  Obviously he has chronic obstructive pulmonary disease and he  is having some difficulty with taking deep breaths because of postop pain.  He also has what looks like yeast in his mouth.   PLAN:  My plan would be to go ahead and ask him to take Mycelex Troche, that  is what he is requesting for his mouth and continue to encourage him to take  deep breaths, get up and move to try to prevent pneumonia.      Edward L. Juanetta Gosling, M.D.  Electronically Signed     ELH/MEDQ  D:  09/18/2005  T:  09/18/2005  Job:  [id number]

## 2011-05-04 NOTE — Group Therapy Note (Signed)
NAME:  Caleb [name], Caleb [name]               ACCOUNT NO.:  [id number]   MEDICAL RECORD NO.:  [id number]          PATIENT TYPE:  INP   LOCATION:  A310                          FACILITY:  [facility]   PHYSICIAN:  Edward L. Juanetta Gosling, M.D.DATE OF BIRTH:  12-26-1934   DATE OF PROCEDURE:  09/23/2005  DATE OF DISCHARGE:                                   PROGRESS NOTE   SUBJECTIVE:  [name] says he is feeling fairly well. He continues to  have some fairly mild abdominal discomfort. He still is coughing some,  bringing up some sputum but not a great deal.   OBJECTIVE:  His physical examination shows that his temperature is 98.2,  pulse 73, respirations 18, blood sugar 126, blood pressure 105/65, O2  saturation 96% on 2 liters. His chest with some rhonchi bilaterally. His  heart is regular. His abdomen is soft. Extremities showed no edema. CNS  grossly intact.   ASSESSMENT:  He seems to be doing relatively well. He has had his stent  placement.  Dr. Katrinka Blazing is a working with him on getting him ready for  discharge; and I am going ahead and check an O2 saturation on room air,  today, and see if he will need oxygen at home; now that he is better.      Edward L. Juanetta Gosling, M.D.  Electronically Signed     ELH/MEDQ  D:  09/23/2005  T:  09/24/2005  Job:  [id number]

## 2011-05-04 NOTE — H&P (Signed)
NAME:  Caleb [name], Caleb [name]               ACCOUNT NO.:  [id number]   MEDICAL RECORD NO.:  [id number]          PATIENT TYPE:  INP   LOCATION:  IC06                          FACILITY:  [facility]   PHYSICIAN:  Angus G. Renard Matter, MD   DATE OF BIRTH:  [birth year]   DATE OF ADMISSION:  DATE OF DISCHARGE:  LH                                HISTORY & PHYSICAL   A 76 year old male presented to the ED at [facility] with  rapid irregular heart beat which came on several hours prior to his  admission to the ED.  He did have some chest pain.  Apparently, the patient  stated he took nitroglycerin with some relief.  He was noted to have  evidence of atrial fibrillation with rapid ventricular response.  In the ED,  his heart rate was over 150.  The patient was started on Cardizem IV.  He  was given a bolus of 20 mg and started on a Cardizem drip.  This slowed his  heart rate some.  He was subsequently admitted as _overflow_________  patient to ICU.   FAMILY HISTORY:  Noncontributory.   SOCIAL HISTORY:  The patient does not smoke or drink.   PAST MEDICAL AND SURGICAL HISTORY:  1.  The patient has a history of having had cholecystectomy in the past.  2.  He does have a history of non-insulin-dependent diabetes.  3.  Hypertension.  4.  Dyslipidemia.   Appears to have no known allergies.   MEDICATION LIST:  1.  Simvastatin 40 mg.  2.  Lisinopril 10 mg daily.  3.  Glyburide 5 mg b.i.d.  4.  Omeprazole 20 mg daily.  5.  Metoclopramide 10 mg at bedtime.  6.  Proscar 5 mg daily.  7.  Gemfibrozil 600 mg b.i.d.  8.  Vitamin C 500 mg daily.   REVIEW OF SYSTEMS:  HEENT:  Negative.  CARDIOPULMONARY:  The patient has had  palpitations and some chest pain.  He has had no cough.  GI:  No bowel  irregularity or bleeding.  GU:  No dysuria, hematuria.   PERTINENT LABORATORY DATA:  CBC:  WBC 7,800 with hemoglobin 13.7, hematocrit  of 41.6.  Chemistry:  Sodium 135, potassium 3.5, chloride 105, CO2  23,  glucose 171, BUN 13, creatinine 0.6 mg, calcium 9.9.  Cardiac markers:  Myoglobin 59.4, CPK-MB 1.1, troponin less than 0.05.   PHYSICAL EXAMINATION:  GENERAL:  Alert white male.  VITAL SIGNS:  Blood pressure 116/99, pulse rate 90, respirations 22.  HEENT:  Eyes PERRLA.  TMs negative.  Oropharynx benign.  NECK:  Supple.  No JVD or thyroid abnormalities.  HEART:  Irregular rapid rhythm.  LUNGS:  Clear to P&A.  ABDOMEN:  No palpable organs or masses.  EXTREMITIES:  Free of edema.  GENITALIA:  Normal.  NEUROLOGIC:  No focal deficit.  SKIN:  Warm and dry.  The patient does have evidence of psoriasis with scaly  plaques over his hands.   DIAGNOSIS:  1.  The patient is admitted with atrial fibrillation with rapid ventricular      response.  2.  He does have non-insulin-dependent diabetes.  3.  Hypertension.  4.  Dyslipidemia.   PLAN:  1.  Continue Cardizem drip at 10 mg/hr.  2.  Continue to cycle cardiac enzymes.  3.  Start Lovenox.  4.  Nitroglycerin p.r.n.      Angus G. Renard Matter, MD  Electronically Signed     AGM/MEDQ  D:  05/14/2006  T:  05/14/2006  Job:  [id number]

## 2011-05-04 NOTE — Group Therapy Note (Signed)
NAME:  PABLO, STAUFFER NO.:  [id number]   MEDICAL RECORD NO.:  [id number]          PATIENT TYPE:  OUT   LOCATION:  RAD                           FACILITY:  [facility]   PHYSICIAN:  Edward L. Juanetta Gosling, M.D.DATE OF BIRTH:  Mar 18, 1935   DATE OF PROCEDURE:  09/28/2005  DATE OF DISCHARGE:  09/12/2005                                   PROGRESS NOTE   Mr. Makarewicz seems to be doing very well. He has had minimal drainage from  his surgical wound. I saw Dr. Katrinka Blazing in the operating room. He asked me to go  ahead and discharge Mr. Ciavarella for him if he was okay. He is afebrile. He  feels well. He wants to go home. He is eating. His chest is clearer, and I  will go ahead and write the discharge order.      Edward L. Juanetta Gosling, M.D.  Electronically Signed     ELH/MEDQ  D:  09/29/2005  T:  09/29/2005  Job:  [id number]

## 2011-05-04 NOTE — Discharge Summary (Signed)
NAME:  Caleb [name], Caleb [name] NO.:  [id number]   MEDICAL RECORD NO.:  [id number]          PATIENT TYPE:  INP   LOCATION:  A310                          FACILITY:  [facility]   PHYSICIAN:  Dirk Dress. Katrinka Blazing, M.D.   DATE OF BIRTH:  [birth year]   DATE OF ADMISSION:  09/14/2005  DATE OF DISCHARGE:  10/13/2006LH                                 DISCHARGE SUMMARY   DISCHARGE DIAGNOSES:  1.  Cholelithiasis, cholecystitis.  2.  Postoperative cystic duct leak, controlled.  3.  Chronic obstructive lung disease.  4.  Coronary artery disease.  5.  Diabetes mellitus.  6.  Osteoarthritis.  7.  Gastroesophageal reflux disease.  8.  Anemia.   PROCEDURES:  1.  Diagnostic laparoscopy with open cholecystectomy on September 29.  2.  Endoscopic retrograde cholangiopancreatography with stent placement on      October 6.  3.  Endoscopic retrograde cholangiopancreatography with removal of clogged      biliary stent and insertion of repeat biliary stent October 27, 2005.   DISPOSITION:  The patient discharged home in stable, satisfactory condition.   DISCHARGE MEDICATIONS:  1.  Simvastatin 40 mg 1/2 at bedtime.  2.  Tramadol 50 mg every 6 hours as needed.  3.  Lisinopril 10 mg daily.  4.  Glyburide 10 mg in the morning, 5 in the evening.  5.  Omeprazole 20 mg daily.  6.  Proscar 5 mg daily.  7.  Lopid 600 mg twice daily.  8.  Vitamin C daily.  9.  Senna laxatives daily.  10. Oxybutynin 5 mg 2 tablets daily.  11. Metformin 500 mg twice daily.  12. Nitroglycerin patch once daily.  13. Felodipine 5 mg daily.   HISTORY OF PRESENT ILLNESS:  This is a 75 year old male with a history of  recurrent epigastric pain with nausea, but no vomiting.  Pain radiated  through to his back. He had difficulty with eating all meals.  Ultrasound  showed multiple stones with gallbladder wall thickening.  The patient  scheduled for cholecystectomy.  Other specifics are given in the admission  note.   PHYSICAL  EXAMINATION:  ABDOMEN:  Abdominal exam revealed epigastric and  right upper quadrant tenderness.  VITAL SIGNS:  He was afebrile.   HOSPITAL COURSE:  The patient had diagnostic laparoscopy with finding of  acute gangrenous cholecystitis with empyema of the gallbladder on September  29.  Because of the amount of inflammation, we resorted to an open  cholecystectomy.  He had a gangrenous gallbladder.  This was taken down  without difficulty.  The patient was noted to have a small leak at the end  of the procedure.  I never really identified the cystic duct, but did  recognize that there was a small leak which was barely detectable.  It was  elected to drain the area and have a stent placed if the biliary drainage  did not improve.  Over the next few days, biliary drainage increased.  It  then decreased for a few days.  By October 5, he was having increased amount  of bilious leak, so GI  service was asked to place of biliary stent.  He was  seen in consultation by Dr. Karilyn Cota and this was placed without difficulty.  Post stent placement, he had more bilious drainage than he had before the  procedure.  Since the JPs were intact, it was decided to wait a day or so to  monitor the flow.  The liver function studies were normal.  He still had  significant biliary drainage, so it was discussed with Dr. Karilyn Cota about the  need for repeat ERCP.  He finally made a disposition and on October 11,  repeat ERCP was done and it was found that the biliary stent was occluded.  No stones were found.  Sphincterotomy was placed and two 10-French, 5 cm  stents were placed.  Post placement of the stents, JP drainage decreased  significantly and the patient remained stable from a biliary standpoint from  that time on.   The patient also had significant COPD and was followed by Dr. Juanetta Gosling.  This  was treated appropriately by Dr. Juanetta Gosling and he did not have any major  exacerbations.  Diabetes was not a problem  during this hospitalization.  The  patient was discharged on October 13, with stable respiratory status,  decreasing biliary drainage, afebrile, tolerating a diet with no other  complaints.      Dirk Dress. Katrinka Blazing, M.D.  Electronically Signed     LCS/MEDQ  D:  11/04/2005  T:  11/05/2005  Job:  [id number]

## 2011-05-04 NOTE — Group Therapy Note (Signed)
NAME:  Caleb [name], Caleb [name]               ACCOUNT NO.:  [id number]   MEDICAL RECORD NO.:  [id number]          PATIENT TYPE:  INP   LOCATION:  A310                          FACILITY:  [facility]   PHYSICIAN:  Edward L. Juanetta Gosling, M.D.DATE OF BIRTH:  [birth year]   DATE OF PROCEDURE:  09/21/2005  DATE OF DISCHARGE:                                   PROGRESS NOTE   SUBJECTIVE:  Caleb [name] says he is feeling pretty well. He is continuing  to cough up some greenish sputum. He is otherwise about the same. He is set  for an ERCP today with stent placement to see if we can get his drains out.  He is still draining a significant amount of fluid.   OBJECTIVE:  VITAL SIGNS:  His temperature is 97.7, pulse 69, respirations  20, blood pressure 115/68, O2 saturation 91% on 2 liters.  CHEST:  His chest shows rhonchi but clearer than it has been, and he looks  better in general.   ASSESSMENT:  He is improving.   PLAN:  Plan is to continue with his treatments and follow.      Edward L. Juanetta Gosling, M.D.  Electronically Signed     ELH/MEDQ  D:  09/21/2005  T:  09/21/2005  Job:  [id number]

## 2011-05-04 NOTE — Op Note (Signed)
NAME:  Caleb [name], Caleb [name] NO.:  [id number]   MEDICAL RECORD NO.:  [id number]          PATIENT TYPE:  INP   LOCATION:  A310                          FACILITY:  [facility]   PHYSICIAN:  Dirk Dress. Katrinka Blazing, M.D.   DATE OF BIRTH:  12-Sep-1935   DATE OF PROCEDURE:  09/14/2005  DATE OF DISCHARGE:                                 OPERATIVE REPORT   PREOPERATIVE DIAGNOSIS:  Cholecystitis, cholelithiasis.   POSTOPERATIVE DIAGNOSIS:  Acute gangrenous cholecystitis with empyema of the  gallbladder, cholelithiasis.   PROCEDURE:  Diagnostic laparoscopy with open cholecystectomy.   SURGEON:  Dr. Katrinka Blazing.   DESCRIPTION:  Under general anesthesia, the patient's abdomen was prepped  and draped in a sterile field. A supraumbilical incision was made, and  Veress needle was inserted uneventfully. Abdomen was insufflated with 3  liters of CO2. Using a Visiport guide, a 10-mm port was placed uneventfully.  Laparoscopy was placed. Under videoscopic guidance, a 10-mm port and two 5-  mm ports were placed in the right subcostal region. The patient was placed  in reversed Trendelenburg position. The infrahepatic space was inspected.  There was a large inflammatory mass in the subhepatic space in the area of  the gallbladder. This was densely covered with omentum, and there was a  development of a phlegmon which included the stomach, colon, and the lateral  pelvic wall. After making an initial attempt to dissect this area, it was  found that it was extremely bloody and decided to convert to an open  procedure very early. All ports were removed. New set up was achieved by the  OR staff in a few minutes. The incisions in the right subcostal space were  connected with a scalpel. Upon entering the abdomen, the large inflammatory  mass was encountered. The gallbladder could not be visualized. Using sharp  and blunt dissection, the omentum was separated from the wall of the  gallbladder. The superior margin  of the transverse colon was densely  adherent to the mass. Using mostly blunt dissection, all of these structures  were separated until the gallbladder could be partially identified.  Dissection was then started at the dome of the gallbladder, and the  posterior wall of the gallbladder was incised. Posterior wall was extremely  necrotic, and once we got in the gallbladder, large volume of pea-green  purulent material was suctioned from the gallbladder. This appeared to be  all pus. It was cultured. The dissection was continued down to the  infundibulum of the gallbladder. In this area, there were two structures  that appeared to be vessels. They were clipped close to the gallbladder. The  gallbladder was then attached by a single structure that appeared to be  cystic duct. It exited over to the ____________ . This ductal structure was  tied close to the gallbladder with two sutures of 2-0 silk and then clipped  with three Hemoccults. Gallbladder was then excised. It was sent to  pathology because of the concern of potential malignancy due to the  thickened area of the posterior wall. Inspection was carried out. Hemostasis  was achieved.  Irrigation was carried out. There was a small leak inferior to  the area of the cystic duct. This was in an area that could not be  adequately dissected. All dissection in this area had been done with blunt  dissection, so it was decided that we would try to close the tissue with  Monocryl. We tried 3-0 Monocryl on a very small needle, and this did not  hold, and we also tried 4-0 Monocryl. Since the leak appeared to be very  slow, it was therefore elected not to try to place any future sutures in the  area but to leave it open and to drain it. Two JP drains were placed and  pulled out through separate lateral stab incisions. A piece of omentum was  in place in the subhepatic space over the drains which were placed in the  subhepatic space and one down to  the foramen of Winslow. The abdomen was  then closed after verifying that sponge, needle, instrument, and blade  counts were correct. The fascia was closed with two layers of 0 Prolene.  Subcutaneous tissue was closed with 3-0 Monocryl. Skin was closed with  staples. The umbilical incision was closed with 0 Vicryl on the fascia. Skin  was closed with staples. The patient tolerated the procedure well. Sterile  dressings were placed. He was awakened without difficulty, transferred to a  bed, and taken to the post anesthetic care unit for further monitoring.      Dirk Dress. Katrinka Blazing, M.D.  Electronically Signed     LCS/MEDQ  D:  09/14/2005  T:  09/14/2005  Job:  [id number]

## 2011-05-04 NOTE — Discharge Summary (Signed)
NAME:  Caleb [name], Caleb [name] NO.:  [id number]   MEDICAL RECORD NO.:  [id number]          PATIENT TYPE:  INP   LOCATION:  3742                         FACILITY:  [facility]   PHYSICIAN:  [place] Bing, M.D. LHCDATE OF BIRTH:  [birth year]   DATE OF ADMISSION:  05/17/2006  DATE OF DISCHARGE:  05/21/2006                                 DISCHARGE SUMMARY   PRIMARY CARDIOLOGIST:  Vida Roller, M.D.   PRIMARY CARE PHYSICIAN:  Edward L. Juanetta Gosling, M.D.   DISCHARGE DIAGNOSES:  1.  Chest pain with positive stress test, status post cardiac      catheterization, with recommendations for medical therapy.  2.  Atrial fibrillation.  3.  Anticoagulation therapy with Coumadin.   PAST MEDICAL HISTORY:  1.  Non-insulin-dependent diabetes.  2.  Chronic obstructive pulmonary disease.  3.  Hypertension.  4.  Hyperlipidemia.  5.  Hypothyroidism.  6.  BPH.  7.  Psoriasis.  8.  History of diverticulitis/hiatal hernia.  9.  Chronic low back pain.  10. Osteoarthritis, on Lovenox.   PROCEDURES THIS ADMISSION:  Cardiac catheterization on May 20, 2006, by Dr.  Dionicio Stall.   HOSPITAL COURSE:  Caleb [name] is a 76 year old Caucasian gentleman followed  by Dr. Kari Baars who initially presented to [facility] with  complaints of chest pain and irregular heartbeat. Upon arrival to [facility] he was found to be in atrial fibrillation with rapid ventricular  response at a rate of greater than 150.  He was given IV Cardizem, heart  rate slowed, and patient still had complaints of chest discomfort. Dr.  Dorethea Clan saw the patient in consultation. Cardiac markers were negative. The  patient converted to normal sinus rhythm. The patient's TSH level was  actually low. It was felt that even at the lower dose of Synthroid the  patient was taking he was being over replaced. Dr. Dorethea Clan scheduled the  patient for a Myoview stress test, which was found to be abnormal. Mr.  Ospina  initially refused cardiac catheterization at [facility].  He wished to be transferred to [facility] for care. He then had change in  decision making and decided to continue with evaluation at [facility]. The patient transferred down on May 17, 2006, for cardiac  catheterization.  The patient was found to have left dominant circulation.  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 The  right coronary artery had a 99% proximal stenosis and a 70% mid stenosis.  Left ventricular ejection fraction at 60% with obvious wall motion  abnormalities. The patient was also found to have 1+ mitral regurgitation.  The plan would be to continue medical therapy for his nondominant right with  aggressive risk factor modification and management. The patient was started  on Coumadin post catheterization. Dr. Dietrich Pates in to see the patient on the  day of discharge. The patient had normal sinus rhythm, cath site stable,  afebrile, blood pressure 98/61. Laboratory work this admission revealed  white blood cell count of 7.9, hemoglobin 12.6, hematocrit 37.1, platelet  count 280,000. Chemistries show  a sodium of 140, potassium 4.0, glucose 89,  BUN 14, creatinine 0.6.   DISPOSITION:  The patient is being discharged home.  He is to follow up with  Dr. Dorethea Clan on June 8th at 10:30. he will also be seen in Coumadin Clinic at  the [facility] office on June 8th at 10:30. The patient states he wishes to  have his Coumadin levels checked at our office. At the time of discharge he  has been given post cardiac catheterization discharge instructions.   MEDICATIONS:  He has a prescription for Warfarin 5 mg p.o. daily, 30  tablets, with 11 refills; Diltiazem 240 mg daily, 30 tablets, with 11  refills. He has been instructed to continue his previous medications  including Zocor 60 mg daily, lisinopril 10  mg daily, NovoLog sliding scale  insulin as previously instructed, felodipine  5 mg daily, vitamin C as  previously taken, Lopid 600 mg b.i.d., Proscar 5 mg daily, Prilosec 20 mg  daily, metformin 500 mg p.o. b.i.d.  The patient is to resume on May 23, 2006.   Duration of discharge encounter is 30 minutes.      Dorian Pod, NP      [place] Bing, M.D. [facility]  Electronically Signed    MB/MEDQ  D:  05/21/2006  T:  05/21/2006  Job:  [id number]   cc:   Ramon Dredge L. Juanetta Gosling, M.D.  Fax: [phone]

## 2011-05-04 NOTE — Consult Note (Signed)
NAME:  Caleb [name], Caleb [name] NO.:  [id number]   MEDICAL RECORD NO.:  [id number]          PATIENT TYPE:  INP   LOCATION:  IC06                          FACILITY:  [facility]   PHYSICIAN:  Vida Roller, M.D.   DATE OF BIRTH:  28-May-1935   DATE OF CONSULTATION:  05/15/2006  DATE OF DISCHARGE:                                   CONSULTATION   HISTORY OF PRESENT ILLNESS:  Caleb [name] is a 76 year old man who is  followed by Dr. Juanetta Gosling.  He was admitted yesterday with atrial  fibrillation, rapid ventricular response.  He says he has been having  irregular heart rate and chest discomfort when his heart rate goes up over  the last 2 weeks.  He thought it was his lung disease, which is quite  substantial and did not think much of it, but as the days went by he had  more and more of it to the point where now he was admitted through the ER.  Once his heart rate was controlled, his chest discomfort has gone away.  He  denies any diaphoresis or nausea or vomiting.  He denies any PND or  orthopnea.  No lower extremity edema.  He does have chronic shortness of  breath which is relatively severe secondary to his COPD.   PAST MEDICAL HISTORY:  1.  Significant for atrial fibrillation.  2.  Non-insulin dependent diabetes.  3.  Hypertension.  4.  Hyperlipidemia.  5.  Hyperthyroidism.  6.  Gastroesophageal reflux disease.  7.  Osteoarthritis.  8.  Asthma.  9.  Psoriasis.  10. History of tuberculosis, and was treated in the [employer] for this about 20      years ago.  11. Low back pain.  12. History of diverticulitis.  13. He has had a bilateral herniorrhaphy.  14. Cholecystectomy.  15. Biliary stenting.  16. His last echocardiogram was in September and showed normal LV systolic      function.   SOCIAL HISTORY:  He used to smoke quite substantially, but quit in 1999.  He  has about a 50 pack-year smoking history.  He does not drink, does not use  drugs.  He is retired.  He lives with his  spouse.  He has 4 children, 3 boys  and a girl, all of whom are healthy.  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   FAMILY HISTORY:  His mother died in her 45s of coronary disease and  hypertension.  Father died in his 64s of unknown causes.  He has 4 brothers  and 2 sisters, none of whom have significant coronary artery disease.   CURRENT MEDICATIONS:  1.  Simvastatin 60 mg q.h.s.  2.  Lisinopril 10 mg once a day.  3.  Glyburide 10 mg twice a day.  4.  Lovenox 85 mg twice a day.  5.  Metformin 500 mg twice a day.  6.  Felodipine 5 mg once a day.  7.  Vitamin C 500 mg once a day.  8.  Gemfibrozil 600 mg twice a day.  9.  Proscar 50 mg once a day.  10. Protonix 40 mg once a day.  11. NovoLog insulin sliding scale.  12. IV Diltiazem.   REVIEW OF SYSTEMS:  He denies any fever or chills, no headaches.  He says  that occasionally he gets dizzy.  He does wear glasses but has no visual  problems.  No rashes or lesions.  He does have the chest discomfort,  shortness of breath, dyspnea on exertion and palpitations.  He also has a  history of claudication in both lower extremities.  He has a cough  productive of white sputum.  No urinary frequency or dysuria.  No weakness,  numbness or mood disturbances.  No arthralgias or myalgias.  No nausea,  vomiting, diarrhea, bright red blood per rectum.  He does occasionally get  gastroesophageal reflux disease symptoms.   PHYSICAL EXAMINATION:  GENERAL:  He is a well-developed, well-nourished  white male in no apparent distress who is alert and oriented x4, sitting  eating breakfast.  VITAL SIGNS:  His heart rate is 95 and irregular, respiratory rate is about  35, blood pressure is 117/82.  He is saturating 96% on 2 liters nasal  cannula.  He weighs 191 pounds.  He is afebrile.  HEENT:  Unremarkable.  NECK:  Supple.  There is no jugular  venous distension or carotid bruits.  CARDIOVASCULAR:  Irregular and mildly tachycardiac.  LUNGS:  Coarse sounds throughout his bases up into the periphery.  He has an  extended expiratory phase.  I do not hear any obvious wheezing, but it is a  very abnormal lung exam.  SKIN:  No rash.  GU/BREAST/RECTAL EXAMS:  Deferred.  ABDOMEN:  Soft.  He has no hepatosplenomegaly.  Normoactive bowel sounds.  EXTREMITIES:  Lower extremities have no edema.  No bruits are noted.  Pulses  were diminished in his left leg in his dorsalis pedis  and posterior tibial  when compared to the right side which is +1.   CHEST X-RAY:  He has a chest x-ray which is pending today.   ELECTROCARDIOGRAM:  His electrocardiogram shows atrial fibrillation, LVH,  nonspecific ST-T wave changes, no Q-waves.  Ventricular rate is about 100   LABORATORY DATA:  White blood cell count 8.7, H&H 14 of 42, platelet count  371.  Sodium 138, potassium 3.9, chloride 105, bicarbonate 24, BUN 15,  creatinine 0.6, blood sugar 189.  Two sets of cardiac enzymes and 2 sets of  point of care enzymes were negative for acute myocardial infarction.  D-  dimer 0.22 which is normal.  So this is a gentleman with atrial fibrillation  with rapid ventricular response likely secondary to his COPD.  He does have  left atrial enlargement on an echocardiogram.   He should be on Coumadin, INR goal is between 2 and 3.  We should stop the  Lovenox when he is therapeutic.  He needs to have his rate controlled with  Diltiazem.  I would add some p.o. Diltiazem; he is going to probably need  that.  Stop his Plendil in the meantime.  We should probably check an  echocardiogram just to make sure his LV systolic function is normal, given  the chest discomfort, and check a set of thyroid function studies, given his  abnormal thyroid axis in the past.  His chest pain is concerning.  His enzymes were negative.  His EKG  does not  look ischemic currently, but I  think he needs probably an evaluation.  He  does have multiple cardiac risk factors including diabetes, so he probably  would benefit from a dobutamine Myoview once his pulmonary status is a bit  more controlled and his heart rate is better controlled.   With regard to his hypertension, that looks okay.   His diabetes is poorly controlled, and that probably needs to be attended to  by the primary.   His COPD obviously is a big issue here and needs to also be aggressively  treated.  Given his mild tachypnea, we will check an echocardiogram today  and follow him with you.      Vida Roller, M.D.  Electronically Signed     JH/MEDQ  D:  05/15/2006  T:  05/15/2006  Job:  [id number]   cc:   Ramon Dredge L. Juanetta Gosling, M.D.  Fax: [phone]

## 2011-05-04 NOTE — Consult Note (Signed)
NAME:  Caleb [name], Caleb [name]               ACCOUNT NO.:  [id number]   MEDICAL RECORD NO.:  [id number]          PATIENT TYPE:  INP   LOCATION:  A310                          FACILITY:  [facility]   PHYSICIAN:  Lionel December, M.D.    DATE OF BIRTH:  1935/07/24   DATE OF CONSULTATION:  09/20/2005  DATE OF DISCHARGE:                                   CONSULTATION   REASON FOR CONSULTATION:  Biliary leak.  Asked to see patient for biliary  stenting.   HISTORY OF PRESENT ILLNESS:  Mr. Maret is a 76 year old Caucasian male  who presented with a few week history of nausea, vomiting and abdominal  pain.  Was found to have cholelithiasis with Dr. Juanetta Gosling.  Prior to that, he  had been seen at the [facility] clinic and had plane films.  He underwent  cholecystectomy on September 14, 2005, by Dr. Elpidio Anis.  It was started  as laparoscopic cholecystectomy.  However, he had a lot of adhesions and  gallbladder was not even seen, so it was converted to open cholecystectomy.  He had gangrenous gallbladder.  Gallbladder landmarks are not clearly  defined.  Gallbladder was separated from the what was felt to be cystic duct  away from the bile duct.  He had some JP drains.  One of the JP drains had  been draining bile anywhere from 123-200 ml per day.  It has not slowed  down.  Therefore, Dr. Katrinka Blazing was concerned that this would not heal  spontaneously and requested ERCP.  At first symptoms of concern, the patient  feels much better.  He is still weak.  He states his appetite is back.  He  has some pain in the right upper quadrant site where he has the JP drains.  He denies nausea, vomiting.  While in the hospital, he has been having some  hypoxemia corrected with nasal O2.  There is no history of COPD, although,  he has been treated with TB many years ago and used to smoke until 7 years  ago.  He denies dyspnea at rest.  He also denies chest pain.   REVIEW OF SYSTEMS:  Negative for recent weight loss, melena, rectal  bleeding.   MEDICATIONS:  1.  Protonix 40 mg IV q.24 h.  2.  Reglan 10 mg IV q.6 h.  3.  Novolin R via sliding scale.  4.  Felodipine 5 mg daily.  5.  Dilaudid 10 mg via PCA pump.  6.  Alupent inhaler q.6 h.  7.  Atrovent inhaler q.6 h.  8.  Albuterol via PCA pump.  9.  Alupent and Atrovent nebulizers q.6 h.  10. Zosyn 3.37 5 g IV q.6 h.  11. Tylenol 650 mg q.4 p.r.n.  12. Phenergan 12.5 mg IV q.4 h p.r.n.   PAST MEDICAL HISTORY:  1.  Treated for pulmonary TB about 20 years ago.  2.  He has diabetes mellitus.  3.  Hypertension.  4.  Hyperlipidemia.  5.  Reflux esophagitis.  6.  Chronic low back pain.  7.  Osteoarthrosis.  8.  About a year ago  he was treated for diverticulitis.  9.  He goes to [place] in [place] for most of his medical problems, but Dr.      Juanetta Gosling is his local primary care physician.  10. History of colonic polyps.  He had colonoscopy, polypectomy by me      several years ago.  Recent colonoscopy has been through the [place] clinic.      He says he had one within the last 2-3 years which was unremarkable      rather than diverticulosis.  11. He has had bilateral inguinal herniorrhaphy.   ALLERGIES:  NK.   FAMILY HISTORY:  Noncontributory.  He has two brothers living.  Two brothers  and two sisters are deceased.  One sister died of some type of malignancy in  her 74s.   SOCIAL HISTORY:  He is married.  His wife is also having health problems.  He has three children.  One son died at age 24 because of respiratory  problems.  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  Smoked cigarettes for years.  Quit 7 years ago.  He does not drink alcohol.   PHYSICAL EXAMINATION:  GENERAL APPEARANCE:  Pleasant, well-developed, well-  nourished, Caucasian male who does not appear to be in any distress.  He has  his nasal O2 on.  VITAL SIGNS:  He weighed 200 pounds on admission.  He is 70 inches tall.  Pulse 62 per minute, blood  pressure 112/58, respirations 20, temperature  97.5.  O2 has been 93-94% on 2 liters.  HEENT:  Conjunctivae is pale.  Sclerae is nonicteric.  Oropharyngeal mucosa  is normal.  He has few remaining teeth in the lower jaw but none in the  upper.  NECK:  No masses or thyromegaly.  CARDIAC:  Regular rhythm.  Normal S1, S3.  No murmur or gallop noted.  LUNGS:  Auscultation reveals diminished intensity of breath sounds  bilaterally.  ABDOMEN:  Protuberant.  He has 2 JP drains.  One of the JP drains full of  bile was emptied and had 100 ml.  Other drain had scant amount of blood-  tinged fluid.  Bowel sounds are normal on palpation.  Soft abdomen.  He has  mild tenderness in the right upper quadrant on the JP entry sites.  No  masses or organomegaly noted.  He does not have peripheral edema or  clubbing.   LABORATORY DATA:  On admission, his WBC was 8.3, H&H 10.6 and 31.6,  platelets count was 463,000.  H&H from two days ago was 8.7 and 25.4.  Three  days ago it was 8.4 and 24.7.  Chemistry panel on admission:  Sodium 136,  potassium 4.8.  Chemistry panel from two days ago:  Sodium 135, potassium  4.1, chloride 98, CO2 35, glucose 118,  BUN 10, creatinine 0.6, calcium 8.3.  LFT's from September 16, 2005, bili 0.2, AP 58, AST 16, ALT 11.   Culture from gallbladder revealed E. coli sensitive to the medicine he is  on, plus everything else.   ASSESSMENT:  Mr. Bell is a 76 year old Caucasian male who underwent open  cholecystectomy for a gangrenous gallbladder on September 14, 2005.  He has  significant biliary drainage and further acute gangrenous gallbladder bile  leaking into one of the JP drains.  I agree with Dr. Katrinka Blazing that this is most  likely a leak from cystic duct remnant.  Since it has not slowed down  and  improved in the last six days, it would be very appropriate to proceed with  ERCP with biliary stenting in order to heal this __________.  The patient also has anemia.  No  evidence of GI bleed.  I suspect this is  multifactorial.   RECOMMENDATIONS:  He will have a CBC, PT/PTT, bleeding time and type and  cross matched.  We will do this in the a.m.  He will undergo ERCP with  biliary stenting in OR tomorrow afternoon.  Sphincterotomy would only be  performed if stent cannot be easily placed across the ampulla vata.   I have reviewed the procedure risks with the patient, and he is agreeable.   We would like to thank Dr. Katrinka Blazing for the opportunity to participate in the  care of this gentleman.      Lionel December, M.D.  Electronically Signed     NR/MEDQ  D:  09/20/2005  T:  09/21/2005  Job:  [id number]

## 2011-05-04 NOTE — Procedures (Signed)
NAME:  AGAM, DAVENPORT NO.:  [id number]   MEDICAL RECORD NO.:  [id number]          PATIENT TYPE:  INP   LOCATION:  IC06                          FACILITY:  [facility]   PHYSICIAN:  Vida Roller, M.D.   DATE OF BIRTH:  12-18-1934   DATE OF PROCEDURE:  DATE OF DISCHARGE:                                    STRESS TEST   BRIEF HISTORY:  This is a 76 year old male who was admitted with atrial  fibrillation with rapid ventricular rate on Cardizem drip who complained  also of chest pain.  The patient has a previous history noninsulin-dependent  diabetes mellitus, hypertension and dyslipidemia and COPD.  The type of test  today was a dobutamine Myoview.   The indication, again, was for chest pain and dyspnea symptoms.  The patient  did have some substernal chest pain at 8/10 during administration of the  dobutamine, that radiated into his back..  The patient was given one  sublingual nitroglycerin in the recovery period, and chest pain resolved  post procedure.  Arrhythmias during procedure the included PVCs and PACs.  Reason for stopping the test was that the patient had achieved his target  heart rate.  His resting heart rate was 88.  His blood pressure was 108/60,  target heart rate was 129, maximum predicted heart rate was 152.  Worst case  ST slope was 2 in V2 and worst case ST level was negative -0.3 in V2.  Maximum heart rate was 135.  Maximum blood pressure was 132/60.  Total time  was 12 minutes 16 seconds.  Final images, again, pending MD review.     ______________________________  April Humphrey, NP      Vida Roller, M.D.  Electronically Signed    AH/MEDQ  D:  05/16/2006  T:  05/16/2006  Job:  [id number]

## 2011-05-04 NOTE — Group Therapy Note (Signed)
NAME:  Caleb [name], CAMBRIDGE               ACCOUNT NO.:  [id number]   MEDICAL RECORD NO.:  [id number]          PATIENT TYPE:  INP   LOCATION:  A310                          FACILITY:  [facility]   PHYSICIAN:  Edward L. Juanetta Gosling, M.D.DATE OF BIRTH:  01-02-1935   DATE OF PROCEDURE:  DATE OF DISCHARGE:                                   PROGRESS NOTE   PROBLEM:  Status post a cholecystectomy.   SUBJECTIVE:  Mr. Dilone says he is doing better. He has no new complaints.  He says he is still having some bouts of being somewhat short of breath.   PHYSICAL EXAMINATION:  Today, shows that he is still using his oxygen. He  has some rhonchi in the area of the right lung. He looks a little more  comfortable. His chest is clear. His heart is regular.   ASSESSMENT:  Looks better but still with problems of course.   PLAN:  I have discussed his situation with he and his family and I have told  all of them that I think he is going to require a hospital bed, a nebulizer  at home and probably oxygen. I have told him that we can try to help arrange  all this for them. I do not plan any other changes in treatment.   PLAN:  He is still having trouble.  Dr. Katrinka Blazing is of course working with him.      Edward L. Juanetta Gosling, M.D.  Electronically Signed     ELH/MEDQ  D:  09/20/2005  T:  09/20/2005  Job:  [id number]

## 2011-05-04 NOTE — Group Therapy Note (Signed)
NAME:  Caleb [name], Caleb [name] NO.:  [id number]   MEDICAL RECORD NO.:  [id number]          PATIENT TYPE:  INP   LOCATION:  IC06                          FACILITY:  [facility]   PHYSICIAN:  Edward L. Juanetta Gosling, M.D.DATE OF BIRTH:  1935/10/06   DATE OF PROCEDURE:  DATE OF DISCHARGE:                                   PROGRESS NOTE   [name] had initially been admitted to the intensive care unit as a 2A  overflow but then yesterday he had very rapid heart rate and I just had him  admitted to the intensive care unit as a full ICU admission.  This morning  he says he feels better.  He has less problems with his heart rate and in  fact he is now in sinus rhythm.  His heart rate is about 90, his blood  pressure 137/95, respirations 16, O2 saturation is 94%.  His chest is fairly  clear with minimal rhonchi.  His heart is regular without gallop.  His  abdomen is soft.  Extremities showed no edema.  Central nervous system  examination:  He is awake, alert, and is grossly intact.  His TSH yesterday  was low and so he may be hyperthyroid.  D-dimer was normal so we did not  have a pulmonary he did not have a CT chest.  He is set for a nuclear  cardiology study today.  He is going to need to be evaluated for the  thyroid.  We may have to wait a bit on some of the evaluation because of  having the nuclear dye, and at this point we will plan to continue with his  treatments, await the cardiology study.  I am thankful that he has converted  to sinus rhythm but I discussed this with his family and told them that he  is likely to continue to have episodes of atrial fibrillation because of his  COPD.  He may require lifetime anticoagulation.  I will leave that decision  to the cardiology team.      Oneal Deputy. Juanetta Gosling, M.D.  Electronically Signed     ELH/MEDQ  D:  05/16/2006  T:  05/16/2006  Job:  [id number]

## 2011-05-04 NOTE — Op Note (Signed)
NAME:  Caleb [name], Caleb [name] NO.:  [id number]   MEDICAL RECORD NO.:  [id number]          PATIENT TYPE:  INP   LOCATION:  A310                          FACILITY:  [facility]   PHYSICIAN:  Lionel December, M.D.    DATE OF BIRTH:  1935/07/22   DATE OF PROCEDURE:  DATE OF DISCHARGE:                                 OPERATIVE REPORT   PROCEDURE:  ERCP with stent removal, sphincterotomy and double stent  placement.   INDICATION:  Caleb [name] is a 76 year old Caucasian male with cystic duct remnant  biliary leak who underwent ERCP on September 21, 2005 and had 10-7 stent  placed. However, his JP drain output has increased. It is therefore  suspected that stent had occluded or has migrated. He is therefore  undergoing repeat procedure. Procedure risks were reviewed with the patient,  and informed consent was obtained.   PREMEDICATION:  Please see anesthesia record for details.   FINDINGS:  Procedures performed in the OR. After the patient was placed  under anesthesia, he was intubated and positioned in semi-prone position.  Therapeutic Olympus video duodenoscope was passed via oropharynx into  esophagus, stomach, and across the pylorus into bulb. Some difficulty  encountered in passing the scope from the bulb and descending duodenum. It  had multiple diverticula. Stent was in place. However, it was occluded. It  was caught with snare and removed under fluoroscopic control. Endoscope was  passed again into the second part of the duodenum. Cholangiogram was  obtained with a dilute contrast using RX44 autotome. CBD and CHD were mildly  dilated. Intrahepatic radicals filled normally. There was some extravasation  from cystic duct remnant. Cystic duct was spiral and quite long. Small  sphincterotomy was performed. A second 03 guidewire was left in place. Two  10-French 5-cm long stents were placed using one-step system. Both stents  appeared to be in a good position. Endoscope was withdrawn. The  patient was  extubated and brought to PACU in stable condition.   FINAL DIAGNOSIS:  1.  Biliary stent is occluded, accounting for increase in bilious drainage      via JP drain.  2.  Small sphincterotomy performed and two 10-French 5-cm biliary stents      were placed.   RECOMMENDATIONS:  Resume his usual medications and diet.   Hopefully, his JP drain drainage will stop completely. We will plan to  remove these stents in 4-6 weeks.      Lionel December, M.D.  Electronically Signed     NR/MEDQ  D:  09/26/2005  T:  09/26/2005  Job:  [id number]   cc:   Dirk Dress. Katrinka Blazing, M.D.  Fax: [phone]   Oneal Deputy. Juanetta Gosling, M.D.  Fax: [phone]

## 2011-05-04 NOTE — Procedures (Signed)
NAME:  Caleb [name], SCALISE NO.:  [id number]   MEDICAL RECORD NO.:  [id number]          PATIENT TYPE:  OUT   LOCATION:  RAD                           FACILITY:  [facility]   PHYSICIAN:  Dani Gobble, MD       DATE OF BIRTH:  09/21/1935   DATE OF PROCEDURE:  09/12/2005  DATE OF DISCHARGE:                                  ECHOCARDIOGRAM   REFERRING PHYSICIAN:  Dirk Dress. Katrinka Blazing, M.D.   INDICATIONS:  Mr. Tinkey is a 76 year old gentleman with past medical  history of diabetes mellitus and hypertension referred for echocardiogram to  evaluate LV function and murmur preoperatively in anticipation of surgery on  Friday.   The technical quality of the study is somewhat limited secondary to patient  body habitus and poor acoustic windows.   M-MODE TRACINGS:  The aorta appears subjectively dilated and measures at the  upper limits of normal at 3.9 cm.   The left atrium also appears mildly dilated, measured 4.5 cm.  The patient  appeared to be in sinus rhythm during this procedure.   The interventricular septum measures at 1.4 cm and the posterior wall  measures 1.3 cm.   The aortic valve is not well- visualized, but appears to have some  thickening and calcification.  All three leaflets are not well- visualized.  No significant aortic insufficiency is noted.  Doppler interrogation of the  aortic valve reveals peak velocity of 1.8 m/sec corresponding to a peak  gradient of 12 mmHg and a mean gradient 10 mmHg.   The mitral valve appears mildly thickened, but with normal leaflet  excursion.  Mild mitral annular calcification is noted.  No mitral valve  prolapse noted.  Mild mitral regurgitation is noted.  Doppler interrogation  of the mitral valve is within normal limits.   The pulmonic valve is not visualized.   Tricuspid valve appears grossly structurally normal, but is also poorly  visualized.  Mild tricuspid regurgitation is noted.   The left ventricle subjectively  appears to be normal in size.  There does  appear to be basal septal hypertrophy as is common in the elderly.  Overall  left systolic function is normal and no regional wall motion abnormalities  are noted.  The presence of diastolic dysfunction is inferred from pulse  wave Doppler across the mitral valve.   The right atrium and right ventricle appear normal in size and right  ventricular  systolic function appears normal.   IMPRESSION:  1.  Mild left atrial or  2.  Mild to moderate concentric left ventricular hypertrophy.  3.  The proximal ascending aorta appears subjectively dilated and measures      at the upper limits of normal.  4.  Very mild aortic stenosis.  5.  Mild mitral and tricuspid regurgitation.  6.  Mild mitral annular calcification.  7.  Normal left ventricular size and systolic function without regional wall      motion abnormality noted.  8.  Presence of diastolic dysfunction is inferred from pulse wave Doppler      across the mitral valve.  ______________________________  Dani Gobble, MD     AB/MEDQ  D:  09/12/2005  T:  09/13/2005  Job:  [id number]   cc:   Dirk Dress. Katrinka Blazing, M.D.  Fax: [phone]

## 2011-05-04 NOTE — Discharge Summary (Signed)
NAME:  Caleb [name], Caleb [name]               ACCOUNT NO.:  [id number]   MEDICAL RECORD NO.:  [id number]          PATIENT TYPE:  INP   LOCATION:  A225                          FACILITY:  [facility]   PHYSICIAN:  Edward L. Juanetta Gosling, M.D.DATE OF BIRTH:  05/24/1935   DATE OF ADMISSION:  05/14/2006  DATE OF DISCHARGE:  LH                                 DISCHARGE SUMMARY   FINAL DISCHARGE DIAGNOSES:  1.  Atrial fibrillation with rapid ventricular response.  2.  Angina pectoris associated with atrial fibrillation.  3.  Coronary artery occlusive disease based on an abnormal Myoview stress      test.  4.  Chronic obstructive pulmonary disease.  5.  Next non-insulin dependent diabetes.  6.  Hypertension.  7.  Hyperlipidemia.  8.  Hypothyroidism.  9.  Benign prostatic hypertrophy.  10. Psoriasis.   HISTORY:  Mr. Arrants is a 76 year old who came to the emergency room with  irregular heartbeat.  He had chest pain with it.  He said he took some  nitroglycerin and had some relief, and when he came to the emergency room,  he was noted to be in atrial fibrillation with rapid ventricular response.  His heart rate was higher than 150.  He was given intravenous Cardizem,  given a drip.  His heart rate slowed, but he was still having trouble.  He  said that he has been in pretty good control as far as his blood sugar is  concerned.  He has had a cholecystectomy that had a chronic draining fistula  that finally closed, and he has had a history of hypothyroidism that was  being treated from the [place].  He was on initially 300 mcg of Synthroid;  apparently that was too much.  He was told to break the tablets in half,  which he has been doing.   OTHER MEDICATIONS:  1.  Simvastatin 40 mg daily.  2.  Lisinopril 10 mg daily.  3.  Glyburide 5 mg b.i.d.  4.  Omeprazole 20 mg daily.  5.  Metoclopramide 10 mg at bedtime.  6.  Proscar on 5 mg daily.  7.  Gemfibrozil 600 mg b.i.d.  8.  Vitamin C 500 mg daily.   PHYSICAL  EXAMINATION:  GENERAL:  The patient was alert.  VITAL SIGNS:  Blood pressure 116/99, pulse rate was 90, still atrial  fibrillation, respirations 22.  HEART:  His heart was irregular and rapid.  CHEST:  Clear.  ABDOMEN:  Soft.  EXTREMITIES:  No edema.  He had some psoriasis with scaly plaques on his  hands.   HOSPITAL COURSE:  He had a cardiac panel and serial cardiac enzymes which  were negative.  He then had converted to sinus rhythm.  Cardiology  consultation was obtained.  His TSH level was quite low, so it was felt that  even at the lower dose of Synthroid he was being over replaced.  He had a  Myoview stress test which was abnormal, and it was felt that he needed  cardiac catheterization.  At this point, he is awaiting information from the  [place] as  to whether he can be transferred there for cardiac catheterization, or  whether he is going to need to be sent to [facility].  He said either is  okay with him.  He would prefer the [place], and that is his first choice.  He is  starting on Coumadin.  He is on Lovenox at full dose for anticoagulation at  this point.  Further medications will be determined once he is transferred  and has his cardiac catheterization.  Follow-up will be after that.      Edward L. Juanetta Gosling, M.D.  Electronically Signed     ELH/MEDQ  D:  05/17/2006  T:  05/17/2006  Job:  [id number]

## 2011-05-04 NOTE — Group Therapy Note (Signed)
NAME:  CHRISTIE, COPLEY NO.:  [id number]   MEDICAL RECORD NO.:  [id number]          PATIENT TYPE:  INP   LOCATION:  IC06                          [facility]AC[facility]   PHYSICIAN:  Edward L. Juanetta Gosling, M.D.DATE OF BIRTH:  September 15, 1935   DATE OF PROCEDURE:  DATE OF DISCHARGE:                                   PROGRESS NOTE   Mr. Puleo says he is doing okay and has no new complaints.  His chest  pain seems to have resolved.  He is now on Lovenox and Cardizem and doing  okay.   Cardiac enzymes thus far are negative.  His complete metabolic profile this  morning shows glucose 189, albumin 3.2, otherwise normal.   EXAMINATION:  Shows he is awake and alert.  CHEST: Clear.  HEART: Still irregular but with a slower heart rate around 90.  Blood  pressure is in the 120/80 range.  Respirations about 22.  ABDOMEN: Soft, obese.  No masses.  His cholecystectomy scar has healed now.  EXTREMITIES: Showed no edema.   ASSESSMENT:  He seems better.   PLAN:  Have him go ahead and get a cardiology consultation.  Reviewing his  lab work, I do not see a D-dimer; if his D-dimer is up, then I think that we  should go ahead and do a CT chest.      Edward L. Juanetta Gosling, M.D.  Electronically Signed     ELH/MEDQ  D:  05/15/2006  T:  05/15/2006  Job:  [id number]

## 2011-05-04 NOTE — Group Therapy Note (Signed)
NAME:  AAIDEN, DEPOY               ACCOUNT NO.:  [id number]   MEDICAL RECORD NO.:  [id number]          PATIENT TYPE:  INP   LOCATION:  A310                          [facility]   PHYSICIAN:  Edward L. Juanetta Gosling, M.D.DATE OF BIRTH:  [birth year]   DATE OF PROCEDURE:  09/26/2005  DATE OF DISCHARGE:                                   PROGRESS NOTE   PROBLEMS:  Status post cholecystectomy, COPD.   SUBJECTIVE:  Mr. Lagos says he is doing better.  He had another stent  placed.  He said that he is not having much abdominal pain.   OBJECTIVE:  VITAL SIGNS:  Temperature 97.8, pulse 65, respirations 20, blood  sugar 89, blood pressure 121/74, O2 saturation 91% on 2 liters.  CHEST:  Clear.  HEART:  Regular.  ABDOMEN:  Soft.   ASSESSMENT:  He has COPD with the surgical problems of his abdomen.   PLAN:  No changes.  Continue with his nebs.  Continue with his other  treatments and follow.      Edward L. Juanetta Gosling, M.D.  Electronically Signed     ELH/MEDQ  D:  09/26/2005  T:  09/27/2005  Job:  [id number]

## 2011-05-04 NOTE — Group Therapy Note (Signed)
NAME:  Caleb [name], Caleb [name]               ACCOUNT NO.:  [id number]   MEDICAL RECORD NO.:  [id number]          PATIENT TYPE:  INP   LOCATION:  A310                          FACILITY:  [facility]   PHYSICIAN:  Edward L. Juanetta Gosling, M.D.DATE OF BIRTH:  [birth year]   DATE OF PROCEDURE:  DATE OF DISCHARGE:                                   PROGRESS NOTE   Mr. Ferdig says he is doing okay, and he is interested in when he is going  to be able to go home. He has not been too active yet. I have encouraged him  to get up and move around and see if that helps with his strength.   His exam today shows he is awake and alert. Temperature is 98.6, pulse 69,  respirations 20, blood sugar 110, blood pressure 97/50. His blood sugar was  135 earlier. His chest is much clear. He looks pretty comfortable. He still  has some rhonchi on the right.   ASSESSMENT:  He seems to doing better.   Plan is to try to get him up and moving, continue with other treatments and  follow.      Edward L. Juanetta Gosling, M.D.  Electronically Signed     ELH/MEDQ  D:  09/19/2005  T:  09/19/2005  Job:  [id number]

## 2011-05-04 NOTE — Op Note (Signed)
NAME:  Caleb [name], Caleb [name]               ACCOUNT NO.:  [id number]   MEDICAL RECORD NO.:  [id number]          PATIENT TYPE:  AMB   LOCATION:  DAY                           FACILITY:  [facility]   PHYSICIAN:  Lionel December, M.D.    DATE OF BIRTH:  25-Feb-1935   DATE OF PROCEDURE:  12/07/2005  DATE OF DISCHARGE:                                 OPERATIVE REPORT   PROCEDURE:  Endoscopic retrograde cholangiopancreatography with removal of  biliary stent and extension of sphincterotomy.   INDICATIONS:  Caleb [name] is a 76 year old Caucasian male who had open  cholecystectomy for gangrenous gallbladder. He had had a biliary leak. He  had ERCP on September 21, 2005. He had cystic duct remnant leak. A 10-French  stent was placed. It only worked for two days. He was taken back to the OR  for another ERCP on September 26, 2005. His stent was noted to have occluded  completely. The stent was removed. Small sphincterotomy was performed, and  then two 10-French stents were placed. He has done well. However, he still  has 20 to 30 cc of bile pouring out of his JP drains. Appears that his leak  has not completely healed, and if the stents were occluded, they are not  helping his condition. He is therefore undergoing this procedure to remove  the stents and extend the sphincterotomy. Procedure and risks were reviewed  with the patient, and informed consent was obtained.   MEDICINES FOR ANESTHESIA:  Please see anesthesia record for details.   FINDINGS:  Procedure performed in OR. After the patient was placed under  anesthesia, he was intubated and positioned in semi prone position.  Therapeutic Olympus video duodenoscope was passed via oropharynx without any  difficulty into stomach, across the pylorus and bulb and descending  duodenum. Multiple duodenal diverticula were noted. The stents were in  place, and there were occluded. These were removed one at a time under  fluoroscopic control with the use of polypectomy snare.  Cholangiogram was  obtained. Initially pancreatic duct was filled. It was dilated body, but no  stricture or mucosal abnormalities were noted. CBD was then selectively  cannulated. CBD and CHD were dilated about 10 to 11 mm in diameter. There  was leak at cystic duct remnant. Intrahepatic biliary radicals were  prominent, but no defects were noted. Sphincterotomy was extended. There was  gush of bile and contrast of the duodenum. Balloon stone extractor was  passed across the bile duct twice. I was able to get 10-mm balloon across  the sphincterotomy without any difficulty. It was decided not to leave the  stent in as he probably has very lithogenic bile. Endoscope was withdrawn.  The patient was extubated and brought to PACU in stable condition. He  tolerated the procedure well.   FINAL DIAGNOSES:  1.  Cystic duct biliary leak has not healed.  2.  Both the stents were occluded and were removed.   The sphincterotomy was extended.   Dilated PD without mucosal abnormalities.   RECOMMENDATIONS:  Will start him on Actigall 300 mg p.o. b.i.d.   He is  to follow with Dr. Elpidio Anis two weeks from now.      Lionel December, M.D.  Electronically Signed     NR/MEDQ  D:  12/07/2005  T:  12/08/2005  Job:  [id number]   cc:   Dirk Dress. Katrinka Blazing, M.D.  Fax: [phone]   Oneal Deputy. Juanetta Gosling, M.D.  Fax: [phone]

## 2011-05-04 NOTE — Group Therapy Note (Signed)
NAME:  Caleb [name], Caleb [name]               ACCOUNT NO.:  [id number]   MEDICAL RECORD NO.:  [id number]          PATIENT TYPE:  INP   LOCATION:  A310                          FACILITY:  [facility]   PHYSICIAN:  Edward L. Juanetta Gosling, M.D.DATE OF BIRTH:  12-Feb-1935   DATE OF PROCEDURE:  09/25/2005  DATE OF DISCHARGE:                                   PROGRESS NOTE   SUBJECTIVE:  Caleb [name] says he is doing okay.  Apparently, his stent was  evaluated again yesterday by Dr. Karilyn Cota and Dr. Karilyn Cota feels that he needs  to be observed for another day or two was some question of having to replace  it.  He still having some drainage from his wound site.   OBJECTIVE:  VITAL SIGNS:  Temperature is 99, pulse 70, respirations 20,  blood sugar 105, blood pressure was 102/58, O2 saturations 95% on 2 L.  CHEST:  Chest is clear.  ABDOMEN:  Abdomen is soft.   ASSESSMENT:  He is improving.   PLAN:  Plan is to continue with treatments.  When he is cleared by Dr.  Karilyn Cota and Dr. Katrinka Blazing he can go home.      Edward L. Juanetta Gosling, M.D.  Electronically Signed     ELH/MEDQ  D:  09/25/2005  T:  09/25/2005  Job:  [id number]

## 2011-05-04 NOTE — Op Note (Signed)
NAME:  Caleb [name], Caleb [name]               ACCOUNT NO.:  [id number]   MEDICAL RECORD NO.:  [id number]          PATIENT TYPE:  INP   LOCATION:  A310                          FACILITY:  [facility]   PHYSICIAN:  Lionel December, M.D.    DATE OF BIRTH:  1935-10-02   DATE OF PROCEDURE:  09/21/2005  DATE OF DISCHARGE:                                 OPERATIVE REPORT   PROCEDURE:  Endoscopic retrograde cholangiopancreatography with biliary  stenting.   INDICATION:  [name] is a 76 year old Caucasian male who had open  cholecystectomy on September 14, 2005. He had a gangrenous gallbladder with  friable tissues. He had two drains placed. He has been draining bile through  one of the drains. Dr. Katrinka Blazing feels that he has leak at cystic duct remnant,  and ERCP is being done to place a biliary stent for hopefully divert the  flow and which would allow [place] to remove his JP drain. Procedure risks were  reviewed with the patient, and informed consent was obtained.   PREMEDICATION:  Please see anesthesia records.   FINDINGS:  Procedure performed in the OR. After the patient placed under  anesthesia, he was intubated and positioned in semi-prone position.  Therapeutic Olympus video duodenoscope was passed via oropharynx into  esophagus and stomach. Mucosa of the esophagus that was seen was normal.  Gastric mucosa was also normal. Scope was passed across the pylorus into  bulb and descending duodenum. He had a couple of duodenal diverticula.  Ampulla was located in the roof of one of these diverticula. CBD was easily  cannulated with RX44 autotome and 035 Hydra Jag wire. Dilute contrast was  injected. CBD and CHD were about 8 to 9 mm in diameter. Intrahepatic biliary  radicals were prominent. A long cystic duct remnant. There was extravasation  either from the tip or just close to it. Once the leak was documented,  sphincterotome was withdrawn, and guidewire was left in place. A 10-French 7-  cm long plastic stent was  advanced using Microvasive rapid exchange system.  As the stent was deployed, there was flow of bile and contrast through the  site port into the duodenum. Endoscope was withdrawn. The patient was  extubated and brought to PACU in stable condition.   FINAL DIAGNOSIS:  Biliary leak at cystic duct remnant. No evidence of  cholelithiasis. A 10-French 7-cm long biliary stent placed.   RECOMMENDATIONS:  Full liquids today. He will be back on his diabetic diet  in a.m. We will check his LFTs and amylase in a.m.      Lionel December, M.D.  Electronically Signed     NR/MEDQ  D:  09/21/2005  T:  09/21/2005  Job:  [id number]   cc:   Dirk Dress. Katrinka Blazing, M.D.  Fax: [phone]

## 2011-05-18 ENCOUNTER — Telehealth: Payer: Self-pay | Admitting: Cardiology

## 2011-05-18 NOTE — Telephone Encounter (Signed)
Patient was started on Levofloxacin 500mg  for 10 days / tg

## 2011-05-18 NOTE — Telephone Encounter (Signed)
Pt started on Levaquin x 10 days today.  Last INR 1.9  Appt made for pt to have INR rechecked on 05/23/11.

## 2011-05-23 ENCOUNTER — Ambulatory Visit (INDEPENDENT_AMBULATORY_CARE_PROVIDER_SITE_OTHER): Payer: PRIVATE HEALTH INSURANCE | Admitting: *Deleted

## 2011-05-23 DIAGNOSIS — Z7901 Long term (current) use of anticoagulants: Secondary | ICD-10-CM | POA: Insufficient documentation

## 2011-05-23 DIAGNOSIS — I4891 Unspecified atrial fibrillation: Secondary | ICD-10-CM

## 2011-05-23 LAB — POCT INR: INR: 2.5

## 2011-05-31 ENCOUNTER — Encounter: Payer: PRIVATE HEALTH INSURANCE | Admitting: *Deleted

## 2011-06-05 ENCOUNTER — Telehealth: Payer: Self-pay | Admitting: *Deleted

## 2011-06-05 NOTE — Telephone Encounter (Signed)
Patient states he has been started on Cephalexin 500 mg / tg

## 2011-06-13 ENCOUNTER — Ambulatory Visit (INDEPENDENT_AMBULATORY_CARE_PROVIDER_SITE_OTHER): Payer: PRIVATE HEALTH INSURANCE | Admitting: *Deleted

## 2011-06-13 DIAGNOSIS — I4891 Unspecified atrial fibrillation: Secondary | ICD-10-CM

## 2011-06-25 ENCOUNTER — Ambulatory Visit (INDEPENDENT_AMBULATORY_CARE_PROVIDER_SITE_OTHER): Payer: PRIVATE HEALTH INSURANCE | Admitting: *Deleted

## 2011-06-25 DIAGNOSIS — I4891 Unspecified atrial fibrillation: Secondary | ICD-10-CM

## 2011-07-11 ENCOUNTER — Ambulatory Visit (INDEPENDENT_AMBULATORY_CARE_PROVIDER_SITE_OTHER): Payer: PRIVATE HEALTH INSURANCE | Admitting: *Deleted

## 2011-07-11 DIAGNOSIS — I4891 Unspecified atrial fibrillation: Secondary | ICD-10-CM

## 2011-08-09 ENCOUNTER — Encounter: Payer: PRIVATE HEALTH INSURANCE | Admitting: *Deleted

## 2011-08-09 ENCOUNTER — Ambulatory Visit (INDEPENDENT_AMBULATORY_CARE_PROVIDER_SITE_OTHER): Payer: PRIVATE HEALTH INSURANCE | Admitting: *Deleted

## 2011-08-09 DIAGNOSIS — I4891 Unspecified atrial fibrillation: Secondary | ICD-10-CM

## 2011-08-09 LAB — POCT INR: INR: 2.2

## 2011-08-17 ENCOUNTER — Telehealth: Payer: Self-pay | Admitting: Cardiology

## 2011-08-17 NOTE — Telephone Encounter (Signed)
Pt has been started on ceftin x 10 days fro bronchitis.  Started yesterday.  Stay on same dose coumadin and check INR on 08/22/11.  Pt verbalized understanding.

## 2011-08-17 NOTE — Telephone Encounter (Signed)
Patient has been put back on antibiotic / it is Cefuroxine Axetil 500 mg bid for 10 days / tg

## 2011-08-22 ENCOUNTER — Ambulatory Visit (INDEPENDENT_AMBULATORY_CARE_PROVIDER_SITE_OTHER): Payer: PRIVATE HEALTH INSURANCE | Admitting: *Deleted

## 2011-08-22 DIAGNOSIS — I4891 Unspecified atrial fibrillation: Secondary | ICD-10-CM

## 2011-08-22 LAB — POCT INR: INR: 2

## 2011-09-06 ENCOUNTER — Encounter: Payer: PRIVATE HEALTH INSURANCE | Admitting: *Deleted

## 2011-09-10 LAB — DIFFERENTIAL
Basophils Absolute: 0.1
Basophils Absolute: 0
Basophils Relative: 1
Basophils Relative: 1
Eosinophils Absolute: 0.2
Eosinophils Relative: 2
Lymphocytes Relative: 17
Lymphocytes Relative: 19
Lymphs Abs: 1.4
Lymphs Abs: 1.4
Monocytes Relative: 8
Neutro Abs: 6.7
Neutro Abs: 4.6
Neutro Abs: 4.6
Neutrophils Relative %: 72
Neutrophils Relative %: 74
Neutrophils Relative %: 69

## 2011-09-10 LAB — SAMPLE TO BLOOD BANK

## 2011-09-10 LAB — CBC
HCT: 40.3
Hemoglobin: 14.1
MCHC: 35
MCHC: 35.2
MCV: 87.1
MCV: 87.4
Platelets: 269
Platelets: 295
Platelets: 275
RBC: 4.61
RBC: 4.39
RDW: 13.7
RDW: 13.6
WBC: 7.8
WBC: 9
WBC: 6.8

## 2011-09-10 LAB — COMPREHENSIVE METABOLIC PANEL WITH GFR
ALT: 26
AST: 26
Albumin: 3.6
Alkaline Phosphatase: 82
BUN: 13
CO2: 27
Calcium: 9.6
Chloride: 104
Creatinine, Ser: 0.68
GFR calc non Af Amer: >60
Glucose, Bld: 147 — ABNORMAL HIGH
Potassium: 3.5
Sodium: 138
Total Bilirubin: 0.7
Total Protein: 6.6

## 2011-09-10 LAB — BASIC METABOLIC PANEL
BUN: 11
Chloride: 107
Creatinine, Ser: 0.58

## 2011-09-10 LAB — PROTIME-INR
INR: 2.2 — ABNORMAL HIGH
INR: 2.2 — ABNORMAL HIGH
INR: 1.2
Prothrombin Time: 25.7 — ABNORMAL HIGH
Prothrombin Time: 25.8 — ABNORMAL HIGH
Prothrombin Time: 20.2 — ABNORMAL HIGH

## 2011-09-13 LAB — BASIC METABOLIC PANEL
Chloride: 106
Creatinine, Ser: 0.74
GFR calc Af Amer: >60
GFR calc non Af Amer: >60
Potassium: 3.9

## 2011-09-13 LAB — DIFFERENTIAL
Eosinophils Absolute: 0.1
Lymphocytes Relative: 20
Lymphs Abs: 1.4
Monocytes Relative: 8
Neutro Abs: 4.9
Neutrophils Relative %: 71

## 2011-09-13 LAB — CBC
MCV: 88.5
RBC: 4.7
WBC: 6.9

## 2011-09-13 LAB — POCT CARDIAC MARKERS
CKMB, poc: 1.4
Myoglobin, poc: 38.3
Myoglobin, poc: 43.1
Operator id: 282261
Troponin i, poc: <0.05

## 2011-09-14 ENCOUNTER — Encounter: Payer: Self-pay | Admitting: Cardiovascular Disease

## 2011-09-14 ENCOUNTER — Ambulatory Visit (INDEPENDENT_AMBULATORY_CARE_PROVIDER_SITE_OTHER): Payer: PRIVATE HEALTH INSURANCE | Admitting: Cardiovascular Disease

## 2011-09-14 DIAGNOSIS — I4891 Unspecified atrial fibrillation: Secondary | ICD-10-CM

## 2011-09-14 DIAGNOSIS — B000 Eczema herpeticum: Secondary | ICD-10-CM

## 2011-09-14 DIAGNOSIS — Z7901 Long term (current) use of anticoagulants: Secondary | ICD-10-CM

## 2011-09-14 DIAGNOSIS — I2510 Atherosclerotic heart disease of native coronary artery without angina pectoris: Secondary | ICD-10-CM

## 2011-09-14 DIAGNOSIS — E782 Mixed hyperlipidemia: Secondary | ICD-10-CM

## 2011-09-14 NOTE — Assessment & Plan Note (Signed)
Continue salve from [place].  Consider referral to dermatology.

## 2011-09-14 NOTE — Assessment & Plan Note (Signed)
Stable with no angina and good activity level.  Continue medical Rx  

## 2011-09-14 NOTE — Assessment & Plan Note (Signed)
INR Rx with no bleeding diathesis.  Known to check INR;s more frequently when on antibiotics

## 2011-09-14 NOTE — Patient Instructions (Signed)
Your physician recommends that you continue on your current medications as directed. Please refer to the Current Medication list given to you today.  Your physician recommends that you schedule a follow-up appointment in: 6 months  

## 2011-09-14 NOTE — Assessment & Plan Note (Signed)
A low fat, low cholesterol is discussed with the patient, and a written copy is given to him.

## 2011-09-14 NOTE — Progress Notes (Signed)
Caleb [name] is seen today for the first time since 2008. He is on chronic coumadin for PAF. His INR's have been ok. 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 He quit smoking in 99 and has apparantly had TB with mild chronic dyspnea. CRF's modified include HTN, DM and elevated lipids. Compliant with meds. Has horrible psoriasis and needs to get more "lotion" from [place]. Warned him that if he lets this go he can get bad cellulitis or sepsis Denies SSCP, palpitations, edema or syncope   ROS: Denies fever, malais, weight loss, blurry vision, decreased visual acuity, cough, sputum, SOB, hemoptysis, pleuritic pain, palpitaitons, heartburn, abdominal pain, melena, lower extremity edema, claudication, or rash.  All other systems reviewed and negative  General: Affect appropriate Healthy:  appears stated age HEENT: normal Neck supple with no adenopathy JVP normal no bruits no thyromegaly Lungs clear with no wheezing and good diaphragmatic motion Heart:  S1/S2 no murmur,rub, gallop or click PMI normal Abdomen: benighn, BS positve, no tenderness, no AAA no bruit.  No HSM or HJR Distal pulses intact with no bruits No edema Neuro non-focal Skin marked eczema on chest and arms No muscular weakness   Current Outpatient Prescriptions  Medication Sig Dispense Refill  . Artificial Tear Ointment (ARTIFICIAL TEARS) ointment Place into both eyes at bedtime.        . Ascorbic Acid (VITAMIN C) 500 MG tablet Take 500 mg by mouth daily.        . carboxymethylcellulose (REFRESH PLUS) 0.5 % SOLN Place 1 drop into both eyes 4 (four) times daily.        Marland Kitchen diltiazem (DILACOR XR) 240 MG 24 hr capsule Take 240 mg by mouth daily.        Marland Kitchen etodolac (LODINE) 400 MG tablet Take 400 mg by mouth daily.        . finasteride (PROSCAR) 5 MG tablet Take 5 mg by mouth daily.        Marland Kitchen lisinopril (PRINIVIL,ZESTRIL) 10 MG tablet Take 10 mg by mouth daily.        . metFORMIN (GLUCOPHAGE) 500 MG tablet Take 500 mg by  mouth 2 (two) times daily.        . metoclopramide (REGLAN) 10 MG tablet Take 10 mg by mouth at bedtime.        . niacin (NIASPAN) 750 MG CR tablet Take 750 mg by mouth at bedtime.        . nitroGLYCERIN (NITROSTAT) 0.4 MG SL tablet Place 0.4 mg under the tongue every 5 (five) minutes as needed.        Marland Kitchen oxybutynin (DITROPAN) 5 MG tablet Take 5 mg by mouth daily.        Marland Kitchen oxyCODONE-acetaminophen (TYLOX) 5-500 MG per capsule Take 1 capsule by mouth as needed.        . phenylephrine-chlorpheniramine-hydrocodone (HISTINEX HC) 5-2-2.5 MG/5ML syrup Take 10 mLs by mouth as needed.        . predniSONE (DELTASONE) 10 MG tablet Take 10 mg by mouth daily.        . promethazine (PHENERGAN) 25 MG tablet Take 25 mg by mouth as needed.        . RABEprazole (ACIPHEX) 20 MG tablet Take 20 mg by mouth daily.        Marland Kitchen senna (SENOKOT) 8.6 MG tablet Take 1 tablet by mouth daily.        . simvastatin (ZOCOR) 20 MG tablet Take 20 mg by mouth daily.        Marland Kitchen  warfarin (COUMADIN) 5 MG tablet TAKE 1 1/2 TABLET BY MOUTH EVERY NIGHT  45 tablet  2    Allergies  Review of patient's allergies indicates no known allergies.  Electrocardiogram:  NSR 74 Old IMI no acute changes  Assessment and Plan

## 2011-09-14 NOTE — Assessment & Plan Note (Signed)
Maint NSR with no palpitatons

## 2011-09-18 ENCOUNTER — Encounter: Payer: Self-pay | Admitting: Cardiovascular Disease

## 2011-09-19 ENCOUNTER — Ambulatory Visit (INDEPENDENT_AMBULATORY_CARE_PROVIDER_SITE_OTHER): Payer: PRIVATE HEALTH INSURANCE | Admitting: *Deleted

## 2011-09-19 DIAGNOSIS — I4891 Unspecified atrial fibrillation: Secondary | ICD-10-CM

## 2011-09-25 LAB — CBC
HCT: 41.8
Hemoglobin: 14.3
MCHC: 34.3
MCV: 87.2
Platelets: 322
RDW: 14.6

## 2011-09-25 LAB — PROTIME-INR: Prothrombin Time: 23.2 — ABNORMAL HIGH

## 2011-09-25 LAB — DIFFERENTIAL
Basophils Absolute: 0
Basophils Relative: 0
Eosinophils Absolute: 0.1 — ABNORMAL LOW
Eosinophils Relative: 2
Lymphocytes Relative: 23
Monocytes Absolute: 0.7

## 2011-09-25 LAB — BASIC METABOLIC PANEL
BUN: 11
CO2: 28
Calcium: 9.2
Chloride: 106
Creatinine, Ser: 0.64
GFR calc Af Amer: >60
Glucose, Bld: 108 — ABNORMAL HIGH

## 2011-09-26 ENCOUNTER — Other Ambulatory Visit: Payer: Self-pay | Admitting: Pharmacist

## 2011-09-26 MED ORDER — WARFARIN SODIUM 5 MG PO TABS: ORAL_TABLET | ORAL | Status: DC

## 2011-10-17 ENCOUNTER — Encounter: Payer: PRIVATE HEALTH INSURANCE | Admitting: *Deleted

## 2011-10-17 ENCOUNTER — Ambulatory Visit (INDEPENDENT_AMBULATORY_CARE_PROVIDER_SITE_OTHER): Payer: PRIVATE HEALTH INSURANCE | Admitting: *Deleted

## 2011-10-17 DIAGNOSIS — Z7901 Long term (current) use of anticoagulants: Secondary | ICD-10-CM

## 2011-10-17 DIAGNOSIS — I4891 Unspecified atrial fibrillation: Secondary | ICD-10-CM

## 2011-11-28 ENCOUNTER — Ambulatory Visit (INDEPENDENT_AMBULATORY_CARE_PROVIDER_SITE_OTHER): Payer: PRIVATE HEALTH INSURANCE | Admitting: *Deleted

## 2011-11-28 DIAGNOSIS — I4891 Unspecified atrial fibrillation: Secondary | ICD-10-CM

## 2011-11-28 DIAGNOSIS — Z7901 Long term (current) use of anticoagulants: Secondary | ICD-10-CM

## 2011-11-28 LAB — POCT INR: INR: 2

## 2011-11-30 ENCOUNTER — Encounter: Payer: Self-pay | Admitting: Cardiovascular Disease

## 2012-01-08 ENCOUNTER — Other Ambulatory Visit (HOSPITAL_COMMUNITY): Payer: Self-pay | Admitting: Pulmonary Disease

## 2012-01-08 ENCOUNTER — Ambulatory Visit (HOSPITAL_COMMUNITY)
Admission: RE | Admit: 2012-01-08 | Discharge: 2012-01-08 | Disposition: A | Discharge status: Home / Self Care | Payer: Medicare Other | Source: Ambulatory Visit | Attending: Pulmonary Disease | Admitting: Pulmonary Disease

## 2012-01-08 DIAGNOSIS — J40 Bronchitis, not specified as acute or chronic: Secondary | ICD-10-CM

## 2012-01-09 ENCOUNTER — Encounter: Payer: PRIVATE HEALTH INSURANCE | Admitting: *Deleted

## 2012-01-09 ENCOUNTER — Ambulatory Visit (INDEPENDENT_AMBULATORY_CARE_PROVIDER_SITE_OTHER): Payer: Medicare Other | Admitting: *Deleted

## 2012-01-09 DIAGNOSIS — I4891 Unspecified atrial fibrillation: Secondary | ICD-10-CM

## 2012-01-09 DIAGNOSIS — Z7901 Long term (current) use of anticoagulants: Secondary | ICD-10-CM

## 2012-01-17 ENCOUNTER — Ambulatory Visit (INDEPENDENT_AMBULATORY_CARE_PROVIDER_SITE_OTHER): Payer: Medicare Other | Admitting: *Deleted

## 2012-01-17 DIAGNOSIS — I4891 Unspecified atrial fibrillation: Secondary | ICD-10-CM

## 2012-01-17 DIAGNOSIS — Z7901 Long term (current) use of anticoagulants: Secondary | ICD-10-CM

## 2012-01-31 ENCOUNTER — Ambulatory Visit (INDEPENDENT_AMBULATORY_CARE_PROVIDER_SITE_OTHER): Payer: Medicare Other | Admitting: *Deleted

## 2012-01-31 DIAGNOSIS — Z7901 Long term (current) use of anticoagulants: Secondary | ICD-10-CM

## 2012-01-31 DIAGNOSIS — I4891 Unspecified atrial fibrillation: Secondary | ICD-10-CM

## 2012-01-31 LAB — POCT INR: INR: 1.8

## 2012-02-18 ENCOUNTER — Ambulatory Visit (INDEPENDENT_AMBULATORY_CARE_PROVIDER_SITE_OTHER): Payer: Medicare Other | Admitting: *Deleted

## 2012-02-18 DIAGNOSIS — I4891 Unspecified atrial fibrillation: Secondary | ICD-10-CM

## 2012-02-18 DIAGNOSIS — Z7901 Long term (current) use of anticoagulants: Secondary | ICD-10-CM

## 2012-02-18 LAB — POCT INR: INR: 2.7

## 2012-02-20 ENCOUNTER — Encounter: Payer: Self-pay | Admitting: Cardiovascular Disease

## 2012-02-20 ENCOUNTER — Ambulatory Visit (INDEPENDENT_AMBULATORY_CARE_PROVIDER_SITE_OTHER): Payer: Medicare Other | Admitting: Cardiovascular Disease

## 2012-02-20 DIAGNOSIS — I10 Essential (primary) hypertension: Secondary | ICD-10-CM

## 2012-02-20 DIAGNOSIS — I2510 Atherosclerotic heart disease of native coronary artery without angina pectoris: Secondary | ICD-10-CM

## 2012-02-20 DIAGNOSIS — I4891 Unspecified atrial fibrillation: Secondary | ICD-10-CM

## 2012-02-20 DIAGNOSIS — E782 Mixed hyperlipidemia: Secondary | ICD-10-CM

## 2012-02-20 NOTE — Progress Notes (Signed)
Patient ID: Caleb Ayala Sr., male   DOB: July 14, 1935, 77 y.o.   MRN: [id number] Kaleth is seen today for the first time since 2008. He is on chronic coumadin for PAF. His INR's have been ok. 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 He quit smoking in 99 and has apparantly had TB with mild chronic dyspnea. CRF's modified include HTN, DM and elevated lipids. Compliant with meds. Has horrible psoriasis and needs to get more "lotion" from [place]. Warned him that if he lets this go he can get bad cellulitis or sepsis Denies SSCP, palpitations, edema or syncope  Lungs are bad.  Exertional dyspnea stable.  Chronic bronchitic cough.  [name]  Had CXR 2 months ago  ROS: Denies fever, malais, weight loss, blurry vision, decreased visual acuity, cough, sputum, SOB, hemoptysis, pleuritic pain, palpitaitons, heartburn, abdominal pain, melena, lower extremity edema, claudication, or rash.  All other systems reviewed and negative  General: Affect appropriate Elderly chronically ill male HEENT: normal Neck supple with no adenopathy JVP normal no bruits no thyromegaly Lungs rhonchi and wheezes througout good diaphragmatic motion Heart:  S1/S2 no murmur, no rub, gallop or click PMI normal Abdomen: benighn, BS positve, no tenderness, no AAA no bruit.  No HSM or HJR Distal pulses intact with no bruits No edema Neuro non-focal Skin psoriatic lesions in both legs No muscular weakness   Current Outpatient Prescriptions  Medication Sig Dispense Refill  . amLODipine (NORVASC) 10 MG tablet Take 5 mg by mouth daily.        . Ascorbic Acid (VITAMIN C) 500 MG tablet Take 500 mg by mouth 2 (two) times daily.       Marland Kitchen aspirin 81 MG chewable tablet Chew 81 mg by mouth daily.        Marland Kitchen diltiazem (DILACOR XR) 240 MG 24 hr capsule Take 240 mg by mouth daily.        . finasteride (PROSCAR) 5 MG tablet Take 5 mg by mouth daily.        . fish oil-omega-3 fatty acids 1000 MG capsule Take 2 g  by mouth 2 (two) times daily.        Marland Kitchen galantamine (RAZADYNE) 8 MG tablet Take 8 mg by mouth 2 (two) times daily.        Marland Kitchen glyBURIDE (DIABETA) 5 MG tablet 2 in the morning and 1 tab at night       . guaiFENesin (MUCINEX) 600 MG 12 hr tablet Take 1,200 mg by mouth as needed.        Marland Kitchen levothyroxine (SYNTHROID, LEVOTHROID) 200 MCG tablet Take 200 mcg by mouth daily.        Marland Kitchen lisinopril (PRINIVIL,ZESTRIL) 10 MG tablet Take 5 mg by mouth daily.       Marland Kitchen loratadine (CLARITIN) 10 MG tablet Take 10 mg by mouth daily.        . metoclopramide (REGLAN) 10 MG tablet Take 10 mg by mouth at bedtime.        . niacin 500 MG tablet Take 1,000 mg by mouth at bedtime.        . nitroGLYCERIN (NITROSTAT) 0.4 MG SL tablet Place 0.4 mg under the tongue every 5 (five) minutes as needed.        Marland Kitchen omeprazole (PRILOSEC) 20 MG capsule Take 20 mg by mouth 2 (two) times daily.        Marland Kitchen oxybutynin (DITROPAN) 5 MG tablet Take 5 mg by mouth  daily.        . rosuvastatin (CRESTOR) 10 MG tablet Take 5 mg by mouth daily.        Marland Kitchen senna (SENOKOT) 8.6 MG tablet Take 1 tablet by mouth daily.        . traMADol (ULTRAM) 50 MG tablet Take 50 mg by mouth 2 (two) times daily.        . Vitamin D, Ergocalciferol, (DRISDOL) 50000 UNITS CAPS Take 50,000 Units by mouth every 7 (seven) days.        Marland Kitchen warfarin (COUMADIN) 5 MG tablet Take as directed by Anticoagulation clinic  30 tablet  3    Allergies  Review of patient's allergies indicates no known allergies.  Electrocardiogram:  Assessment and Plan

## 2012-02-20 NOTE — Patient Instructions (Signed)
**Note De-identified  Obfuscation** Your physician recommends that you schedule a follow-up appointment in: 6 months  

## 2012-02-20 NOTE — Assessment & Plan Note (Signed)
Well controlled.  Continue current medications and low sodium Dash type diet.    

## 2012-02-20 NOTE — Assessment & Plan Note (Signed)
Good rate control and anticoagulation.  No bleeding problems  F/U coumadin clinic 03/20/12

## 2012-02-20 NOTE — Assessment & Plan Note (Signed)
Distant history. No angina  Activity limited by lung disease.  Continue medical Rx

## 2012-02-20 NOTE — Assessment & Plan Note (Signed)
Cholesterol is at goal.  Continue current dose of statin and diet Rx.  No myalgias or side effects.  F/U  LFT's in 6 months. No results found for this basename: LDLCALC             

## 2012-03-17 ENCOUNTER — Ambulatory Visit (INDEPENDENT_AMBULATORY_CARE_PROVIDER_SITE_OTHER): Payer: Medicare Other | Admitting: *Deleted

## 2012-03-17 DIAGNOSIS — Z7901 Long term (current) use of anticoagulants: Secondary | ICD-10-CM

## 2012-03-17 DIAGNOSIS — I4891 Unspecified atrial fibrillation: Secondary | ICD-10-CM

## 2012-03-17 LAB — POCT INR: INR: 2

## 2012-04-16 ENCOUNTER — Ambulatory Visit (INDEPENDENT_AMBULATORY_CARE_PROVIDER_SITE_OTHER): Payer: Medicare Other | Admitting: *Deleted

## 2012-04-16 DIAGNOSIS — Z7901 Long term (current) use of anticoagulants: Secondary | ICD-10-CM

## 2012-04-16 DIAGNOSIS — I4891 Unspecified atrial fibrillation: Secondary | ICD-10-CM

## 2012-05-12 ENCOUNTER — Other Ambulatory Visit: Payer: Self-pay | Admitting: Cardiovascular Disease

## 2012-05-28 ENCOUNTER — Ambulatory Visit (INDEPENDENT_AMBULATORY_CARE_PROVIDER_SITE_OTHER): Payer: Medicare Other | Admitting: *Deleted

## 2012-05-28 DIAGNOSIS — I4891 Unspecified atrial fibrillation: Secondary | ICD-10-CM

## 2012-05-28 DIAGNOSIS — Z7901 Long term (current) use of anticoagulants: Secondary | ICD-10-CM

## 2012-05-28 LAB — POCT INR: INR: 2.5

## 2012-07-09 ENCOUNTER — Ambulatory Visit (INDEPENDENT_AMBULATORY_CARE_PROVIDER_SITE_OTHER): Payer: Medicare Other | Admitting: *Deleted

## 2012-07-09 DIAGNOSIS — Z7901 Long term (current) use of anticoagulants: Secondary | ICD-10-CM

## 2012-07-09 DIAGNOSIS — I4891 Unspecified atrial fibrillation: Secondary | ICD-10-CM

## 2012-07-09 LAB — POCT INR: INR: 2.2

## 2012-07-25 ENCOUNTER — Ambulatory Visit (INDEPENDENT_AMBULATORY_CARE_PROVIDER_SITE_OTHER): Payer: Medicare Other | Admitting: Cardiovascular Disease

## 2012-07-25 ENCOUNTER — Encounter: Payer: Self-pay | Admitting: Cardiovascular Disease

## 2012-07-25 VITALS — BP 128/72 | HR 83 | Ht 72.0 in | Wt 204.0 lb

## 2012-07-25 DIAGNOSIS — I2510 Atherosclerotic heart disease of native coronary artery without angina pectoris: Secondary | ICD-10-CM

## 2012-07-25 DIAGNOSIS — E782 Mixed hyperlipidemia: Secondary | ICD-10-CM

## 2012-07-25 DIAGNOSIS — I4891 Unspecified atrial fibrillation: Secondary | ICD-10-CM

## 2012-07-25 DIAGNOSIS — I10 Essential (primary) hypertension: Secondary | ICD-10-CM

## 2012-07-25 NOTE — Addendum Note (Signed)
Addended by: Waymon Budge on: 07/25/2012 03:53 PM   Modules accepted: Orders

## 2012-07-25 NOTE — Assessment & Plan Note (Signed)
Cholesterol is at goal.  Continue current dose of statin and diet Rx.  No myalgias or side effects.  F/U  LFT's in 6 months. No results found for this basename: LDLCALC             

## 2012-07-25 NOTE — Patient Instructions (Signed)
Your physician recommends that you schedule a follow-up appointment in 1 YEAR.  

## 2012-07-25 NOTE — Assessment & Plan Note (Signed)
Stable with no angina and good activity level.  Continue medical Rx  

## 2012-07-25 NOTE — Assessment & Plan Note (Signed)
Well controlled.  Continue current medications and low sodium Dash type diet.    

## 2012-07-25 NOTE — Progress Notes (Signed)
Patient ID: Caleb Rosenboom Sr., male   DOB: [birth year], 77 y.o.   MRN: [id number] Caleb [name] is seen today for F/U of PAF . He is on chronic coumadin for PAF. His INR's have been ok. He is followed in our coumadin clinic His primary is Caleb [name]. He quit smoking in 99 and has apparantly had TB with mild chronic dyspnea. CRF's modified include HTN, DM and elevated lipids. Compliant with meds. Has horrible psoriasis and needs to get more "lotion" from [place]. Warned him that if he lets this go he can get bad cellulitis or sepsis Denies SSCP, palpitations, edema or syncope  Lungs are bad. Exertional dyspnea stable. Chronic bronchitic cough. [facility] Caleb [name] Had CXR this year.   Two sons live with him and help out  ROS: Denies fever, malais, weight loss, blurry vision, decreased visual acuity, cough, sputum, SOB, hemoptysis, pleuritic pain, palpitaitons, heartburn, abdominal pain, melena, lower extremity edema, claudication, or rash.  All other systems reviewed and negative  General: Affect appropriate Healthy:  appears stated age HEENT: normal Neck supple with no adenopathy JVP normal no bruits no thyromegaly Lungs clear with no wheezing and good diaphragmatic motion Heart:  S1/S2 no murmur, no rub, gallop or click PMI normal Abdomen: benighn, BS positve, no tenderness, no AAA no bruit.  No HSM or HJR Distal pulses intact with no bruits No edema Neuro non-focal Skin warm and dry psoriatic lesions on arms No muscular weakness   Current Outpatient Prescriptions  Medication Sig Dispense Refill  . amLODipine (NORVASC) 10 MG tablet Take 5 mg by mouth daily.        . Ascorbic Acid (VITAMIN C) 500 MG tablet Take 500 mg by mouth 2 (two) times daily.       Marland Kitchen aspirin 81 MG chewable tablet Chew 81 mg by mouth daily.        . clobetasol cream (TEMOVATE) 0.05 % Apply 1 application topically 2 (two) times daily.       Marland Kitchen diltiazem (DILACOR XR) 240 MG 24 hr capsule Take 240 mg by mouth daily.        .  finasteride (PROSCAR) 5 MG tablet Take 5 mg by mouth daily.        . fish oil-omega-3 fatty acids 1000 MG capsule Take 2 g by mouth 2 (two) times daily.        Marland Kitchen galantamine (RAZADYNE) 8 MG tablet Take 8 mg by mouth 2 (two) times daily.        Marland Kitchen glyBURIDE (DIABETA) 5 MG tablet 2 in the morning and 1 tab at night       . guaiFENesin (MUCINEX) 600 MG 12 hr tablet Take 1,200 mg by mouth as needed.        Marland Kitchen levothyroxine (SYNTHROID, LEVOTHROID) 200 MCG tablet Take 200 mcg by mouth daily.        Marland Kitchen lisinopril (PRINIVIL,ZESTRIL) 10 MG tablet Take 5 mg by mouth daily.       Marland Kitchen loratadine (CLARITIN) 10 MG tablet Take 10 mg by mouth daily.        . metoclopramide (REGLAN) 10 MG tablet Take 10 mg by mouth at bedtime.        . niacin 500 MG tablet Take 1,000 mg by mouth at bedtime.        . nitroGLYCERIN (NITROSTAT) 0.4 MG SL tablet Place 0.4 mg under the tongue every 5 (five) minutes as needed.        Marland Kitchen omeprazole (PRILOSEC) 20 MG  capsule Take 20 mg by mouth 2 (two) times daily.        Marland Kitchen oxybutynin (DITROPAN) 5 MG tablet Take 5 mg by mouth daily.        . rosuvastatin (CRESTOR) 10 MG tablet Take 5 mg by mouth daily.        Marland Kitchen senna (SENOKOT) 8.6 MG tablet Take 1 tablet by mouth daily.        . traMADol (ULTRAM) 50 MG tablet Take 50 mg by mouth 2 (two) times daily.        . Vitamin D, Ergocalciferol, (DRISDOL) 50000 UNITS CAPS Take 50,000 Units by mouth every 7 (seven) days.        Marland Kitchen warfarin (COUMADIN) 5 MG tablet TAKE AS DIRECTED BY ANTICOAGULATION CLINIC  30 tablet  3    Allergies  Review of patient's allergies indicates no known allergies.  Electrocardiogram:  NSR rate 83  Normal ECG  Assessment and Plan

## 2012-07-25 NOTE — Assessment & Plan Note (Signed)
Maint NSR with no palpitations.  I actually suggested that he might come off of coumadin but the patient refuses.  Says someone told him a long time ago he could never come off.  Will try to review history but he wants to stay on it

## 2012-07-28 ENCOUNTER — Ambulatory Visit: Payer: Medicare Other | Admitting: Cardiovascular Disease

## 2012-08-20 ENCOUNTER — Ambulatory Visit (INDEPENDENT_AMBULATORY_CARE_PROVIDER_SITE_OTHER): Payer: Medicare Other | Admitting: *Deleted

## 2012-08-20 DIAGNOSIS — Z7901 Long term (current) use of anticoagulants: Secondary | ICD-10-CM

## 2012-08-20 DIAGNOSIS — I4891 Unspecified atrial fibrillation: Secondary | ICD-10-CM

## 2012-09-01 ENCOUNTER — Ambulatory Visit: Payer: Medicare Other | Attending: Pulmonary Disease | Admitting: Sleep Medicine

## 2012-09-01 DIAGNOSIS — Z6830 Body mass index (BMI) 30.0-30.9, adult: Secondary | ICD-10-CM | POA: Insufficient documentation

## 2012-09-01 DIAGNOSIS — G4730 Sleep apnea, unspecified: Secondary | ICD-10-CM

## 2012-09-01 DIAGNOSIS — G4733 Obstructive sleep apnea (adult) (pediatric): Secondary | ICD-10-CM | POA: Insufficient documentation

## 2012-09-09 NOTE — Procedures (Signed)
[facility] Shaneice Barsanti A. Gerilyn Pilgrim, MD     [email or web address]        NAME:  ARDIT, DANH               ACCOUNT NO.:  [id number]  MEDICAL RECORD NO.:  [id number]          PATIENT TYPE:  OUT  LOCATION:  SLEEP LAB                     FACILITY:  [facility]  PHYSICIAN:  Davius Goudeau A. Gerilyn Pilgrim, M.D. DATE OF BIRTH:  1935-07-25  DATE OF STUDY:  09/01/2012                           NOCTURNAL POLYSOMNOGRAM  REFERRING PHYSICIAN:  Ramon Dredge L. Juanetta Gosling, M.D.  INDICATIONS:  This is a 77 year old, who presents with fatigue and snoring.  The study is being done to evaluate for obstructive sleep apnea syndrome.  MEDICATIONS:  Tramadol, finasteride, levothyroxine, amlodipine, oxybutynin, lisinopril, glyburide, omeprazole, galantamine, diltiazem, vitamin D, folic acid,  methotrexate, Lipitor, niacin, aspirin, warfarin.  EPWORTH SLEEPINESS SCORE:  2.  BMI 30.  ARCHITECTURAL SUMMARY:  This is a split night recording with initial portion being a diagnostic and second portion with titration recording. The total recording time is 404 minutes,  sleep efficiency 68%.  Sleep latency 20 minutes,  REM latency 151 minutes.  RESPIRATORY SUMMARY:  Baseline oxygen saturation is 89, lowest saturation 74 during REM sleep.  Diagnostic AHI is 30.  The patient was placed on positive pressure between 5 and 7.  Optimal pressure is 7 with resolution of obstructive events.  PLM index is 4.  ELECTROCARDIOGRAM SUMMARY:  Average heart rate is 65 with infrequent PVCs and PACs.  IMPRESSION: 1. Moderate obstructive sleep apnea syndrome which responds well to a     CPAP of 7. 2. Mild periodic limb movement disorder sleep.  RECOMMENDATION:  CPAP of 7.  Thanks for this referral.    Darreon Lutes A. Gerilyn Pilgrim, M.D.    KAD/MEDQ  D:  09/09/2012 10:16:03  T:  09/09/2012 10:40:05  Job:  [id number]

## 2012-09-24 ENCOUNTER — Other Ambulatory Visit: Payer: Self-pay | Admitting: Cardiovascular Disease

## 2012-09-24 NOTE — Telephone Encounter (Signed)
Please review and refill, Thank You. 

## 2012-10-01 ENCOUNTER — Ambulatory Visit (INDEPENDENT_AMBULATORY_CARE_PROVIDER_SITE_OTHER): Payer: Medicare Other | Admitting: *Deleted

## 2012-10-01 DIAGNOSIS — I4891 Unspecified atrial fibrillation: Secondary | ICD-10-CM

## 2012-10-01 DIAGNOSIS — Z7901 Long term (current) use of anticoagulants: Secondary | ICD-10-CM

## 2012-10-09 ENCOUNTER — Telehealth: Payer: Self-pay | Admitting: *Deleted

## 2012-10-09 NOTE — Telephone Encounter (Signed)
Pt states he is having surgery for places on his back at the [place] and he will have to be put to sleep.  They told him to stop his coumadin on 10/25 for surgery on 10/30.  Told pt to resume coumadin night of procedure and keep INR appt. As scheduled.

## 2012-11-12 ENCOUNTER — Ambulatory Visit (INDEPENDENT_AMBULATORY_CARE_PROVIDER_SITE_OTHER): Payer: Medicare Other | Admitting: *Deleted

## 2012-11-12 DIAGNOSIS — I4891 Unspecified atrial fibrillation: Secondary | ICD-10-CM

## 2012-11-12 DIAGNOSIS — Z7901 Long term (current) use of anticoagulants: Secondary | ICD-10-CM

## 2012-11-12 LAB — POCT INR: INR: 2.6

## 2012-12-24 ENCOUNTER — Ambulatory Visit (INDEPENDENT_AMBULATORY_CARE_PROVIDER_SITE_OTHER): Payer: Medicare Other | Admitting: *Deleted

## 2012-12-24 DIAGNOSIS — Z7901 Long term (current) use of anticoagulants: Secondary | ICD-10-CM

## 2012-12-24 DIAGNOSIS — I4891 Unspecified atrial fibrillation: Secondary | ICD-10-CM

## 2012-12-24 LAB — POCT INR: INR: 1.7

## 2013-01-15 ENCOUNTER — Ambulatory Visit (INDEPENDENT_AMBULATORY_CARE_PROVIDER_SITE_OTHER): Payer: Medicare Other | Admitting: *Deleted

## 2013-01-15 DIAGNOSIS — I4891 Unspecified atrial fibrillation: Secondary | ICD-10-CM

## 2013-01-15 DIAGNOSIS — Z7901 Long term (current) use of anticoagulants: Secondary | ICD-10-CM

## 2013-02-12 ENCOUNTER — Ambulatory Visit (INDEPENDENT_AMBULATORY_CARE_PROVIDER_SITE_OTHER): Payer: Medicare Other | Admitting: *Deleted

## 2013-02-12 DIAGNOSIS — Z7901 Long term (current) use of anticoagulants: Secondary | ICD-10-CM

## 2013-02-12 DIAGNOSIS — I4891 Unspecified atrial fibrillation: Secondary | ICD-10-CM

## 2013-02-12 LAB — POCT INR: INR: 2.2

## 2013-02-13 ENCOUNTER — Telehealth: Payer: Self-pay | Admitting: Cardiology

## 2013-02-13 MED ORDER — WARFARIN SODIUM 5 MG PO TABS: 5.0000 mg | ORAL_TABLET | Freq: Every day | ORAL | Status: DC

## 2013-02-13 NOTE — Telephone Encounter (Signed)
Unable to reach patient.  Coumadin has been sent to pharmacy, as requested.

## 2013-02-13 NOTE — Telephone Encounter (Signed)
PT IS UPSET STATES THAT WE WILL NOT FILL HIM COUMADIN.  PLEASE CALL IN TODAY TO [facility] [address].  IF WE ARE NOT GOING TO REFILL PLEASE CALL HIM BACK TODAY AS WELL.TMJ

## 2013-03-12 ENCOUNTER — Ambulatory Visit (INDEPENDENT_AMBULATORY_CARE_PROVIDER_SITE_OTHER): Payer: Medicare Other | Admitting: *Deleted

## 2013-03-12 DIAGNOSIS — I4891 Unspecified atrial fibrillation: Secondary | ICD-10-CM

## 2013-03-12 DIAGNOSIS — Z7901 Long term (current) use of anticoagulants: Secondary | ICD-10-CM

## 2013-03-12 LAB — POCT INR: INR: 2.4

## 2013-04-23 ENCOUNTER — Ambulatory Visit (INDEPENDENT_AMBULATORY_CARE_PROVIDER_SITE_OTHER): Payer: Medicare Other | Admitting: *Deleted

## 2013-04-23 DIAGNOSIS — Z7901 Long term (current) use of anticoagulants: Secondary | ICD-10-CM

## 2013-04-23 DIAGNOSIS — I4891 Unspecified atrial fibrillation: Secondary | ICD-10-CM

## 2013-04-23 LAB — POCT INR: INR: 1.8

## 2013-05-12 ENCOUNTER — Emergency Department (HOSPITAL_COMMUNITY): Payer: Medicare Other

## 2013-05-12 ENCOUNTER — Encounter (HOSPITAL_COMMUNITY): Payer: Self-pay | Admitting: Cardiology

## 2013-05-12 ENCOUNTER — Emergency Department (HOSPITAL_COMMUNITY)
Admission: EM | Admit: 2013-05-12 | Discharge: 2013-05-12 | Disposition: A | Discharge status: Home / Self Care | Payer: Medicare Other | Attending: Emergency Medicine | Admitting: Emergency Medicine

## 2013-05-12 DIAGNOSIS — W1809XA Striking against other object with subsequent fall, initial encounter: Secondary | ICD-10-CM | POA: Insufficient documentation

## 2013-05-12 DIAGNOSIS — Y9289 Other specified places as the place of occurrence of the external cause: Secondary | ICD-10-CM | POA: Insufficient documentation

## 2013-05-12 DIAGNOSIS — Y9389 Activity, other specified: Secondary | ICD-10-CM | POA: Insufficient documentation

## 2013-05-12 DIAGNOSIS — E785 Hyperlipidemia, unspecified: Secondary | ICD-10-CM | POA: Insufficient documentation

## 2013-05-12 DIAGNOSIS — Z8611 Personal history of tuberculosis: Secondary | ICD-10-CM | POA: Insufficient documentation

## 2013-05-12 DIAGNOSIS — Z23 Encounter for immunization: Secondary | ICD-10-CM | POA: Insufficient documentation

## 2013-05-12 DIAGNOSIS — Z7982 Long term (current) use of aspirin: Secondary | ICD-10-CM | POA: Insufficient documentation

## 2013-05-12 DIAGNOSIS — S6990XA Unspecified injury of unspecified wrist, hand and finger(s), initial encounter: Secondary | ICD-10-CM | POA: Insufficient documentation

## 2013-05-12 DIAGNOSIS — D689 Coagulation defect, unspecified: Secondary | ICD-10-CM | POA: Insufficient documentation

## 2013-05-12 DIAGNOSIS — S0180XA Unspecified open wound of other part of head, initial encounter: Secondary | ICD-10-CM | POA: Insufficient documentation

## 2013-05-12 DIAGNOSIS — Z8709 Personal history of other diseases of the respiratory system: Secondary | ICD-10-CM | POA: Insufficient documentation

## 2013-05-12 DIAGNOSIS — Z87891 Personal history of nicotine dependence: Secondary | ICD-10-CM | POA: Insufficient documentation

## 2013-05-12 DIAGNOSIS — S0191XA Laceration without foreign body of unspecified part of head, initial encounter: Secondary | ICD-10-CM

## 2013-05-12 DIAGNOSIS — Z9861 Coronary angioplasty status: Secondary | ICD-10-CM | POA: Insufficient documentation

## 2013-05-12 DIAGNOSIS — Z7901 Long term (current) use of anticoagulants: Secondary | ICD-10-CM | POA: Insufficient documentation

## 2013-05-12 DIAGNOSIS — Z87448 Personal history of other diseases of urinary system: Secondary | ICD-10-CM | POA: Insufficient documentation

## 2013-05-12 DIAGNOSIS — Z86711 Personal history of pulmonary embolism: Secondary | ICD-10-CM | POA: Insufficient documentation

## 2013-05-12 DIAGNOSIS — Z79899 Other long term (current) drug therapy: Secondary | ICD-10-CM | POA: Insufficient documentation

## 2013-05-12 DIAGNOSIS — S0990XA Unspecified injury of head, initial encounter: Secondary | ICD-10-CM | POA: Insufficient documentation

## 2013-05-12 DIAGNOSIS — S59909A Unspecified injury of unspecified elbow, initial encounter: Secondary | ICD-10-CM | POA: Insufficient documentation

## 2013-05-12 DIAGNOSIS — IMO0002 Reserved for concepts with insufficient information to code with codable children: Secondary | ICD-10-CM | POA: Insufficient documentation

## 2013-05-12 DIAGNOSIS — I2510 Atherosclerotic heart disease of native coronary artery without angina pectoris: Secondary | ICD-10-CM | POA: Insufficient documentation

## 2013-05-12 LAB — PROTIME-INR: Prothrombin Time: 16.6 seconds — ABNORMAL HIGH (ref 11.6–15.2)

## 2013-05-12 MED ORDER — MORPHINE SULFATE 4 MG/ML IJ SOLN: 4.0000 mg | Freq: Once | INTRAMUSCULAR | Status: DC

## 2013-05-12 MED ORDER — TETANUS-DIPHTH-ACELL PERTUSSIS 5-2.5-18.5 LF-MCG/0.5 IM SUSP
0.5000 mL | Freq: Once | INTRAMUSCULAR | Status: AC
  Administered 2013-05-12: 0.5 mL via INTRAMUSCULAR
  Filled 2013-05-12: qty 0.5

## 2013-05-12 MED ORDER — MORPHINE SULFATE 4 MG/ML IJ SOLN
4.0000 mg | Freq: Once | INTRAMUSCULAR | Status: AC
  Administered 2013-05-12: 4 mg via INTRAMUSCULAR
  Filled 2013-05-12: qty 1

## 2013-05-12 NOTE — ED Notes (Signed)
Dr. Pippin at the bedside.  

## 2013-05-12 NOTE — ED Notes (Signed)
Dr. Delo at the bedside 

## 2013-05-12 NOTE — ED Notes (Signed)
Pt to department via EMS- pt reports that he was visiting his wife at [facility] and missed a step. Pt fell and hit hit head on the concrete. Laceration to the right side of forehead and scrapes to the left knee and a right palm. Pt reports that he is on coumadin. Bleeding controlled at this time. Bp-122/70 Hr-70

## 2013-05-12 NOTE — ED Provider Notes (Signed)
History     CSN: [id number]  Arrival date & time 05/12/13  1034   First MD Initiated Contact with Patient 05/12/13 1036      Chief Complaint  Patient presents with  . Fall     Patient is a 78 y.o. male presenting with fall and head injury.  Fall Pertinent negatives include no abdominal pain, chest pain, chills, congestion, coughing, fever, headaches, nausea, neck pain, numbness, rash, vomiting or weakness.  Head Injury Location:  Frontal Time since incident:  1 hour Mechanism of injury: fall   Pain details:    Quality:  Aching   Radiates to:  Face (Has a laceration to right side of forehead at the superior aspect of his eyebrow)   Severity:  Mild   Timing:  Constant   Progression:  Unchanged Chronicity:  New Relieved by:  Nothing Exacerbated by: nothing. Ineffective treatments:  None tried Associated symptoms: no blurred vision, no difficulty breathing, no double vision, no focal weakness, no headaches, no loss of consciousness, no nausea, no neck pain, no numbness and no vomiting   Risk factors comment:  Takes Coumadin  He reports that he fell while stepping off a curb and his left foot "gave out". He fell the ground hitting his head on concrete. He denies headache but does have a laceration to his for head on the right side. He reports moderate blood loss. He denies any nausea, vomiting, neck pain, chest pain, shortness of breath, palpitations, abdominal pain. He does have pain to his right elbow. He has a superficial laceration to his right hand, palmar surface. He also has a superficial abrasion to his left thigh. However he reports that he has no pain in the leg and has been ambulatory since the fall.  Past Medical History  Diagnosis Date  . Acute exacerbation of chronic bronchitis   . History of tuberculosis   . CAD (coronary artery disease)   . History of pulmonary embolism   . Hyperlipidemia   . BPH (benign prostatic hypertrophy)     Past Surgical History   Procedure Laterality Date  . Cardiac catheterization  2007  . Cholecystectomy      Family History  Problem Relation Age of Onset  . Heart attack Father     History  Substance Use Topics  . Smoking status: Former Games developer  . Smokeless tobacco: Not on file  . Alcohol Use: No      Review of Systems  Constitutional: Negative for fever and chills.  HENT: Negative for congestion, rhinorrhea, neck pain and neck stiffness.   Eyes: Negative for blurred vision, double vision and visual disturbance.  Respiratory: Negative for cough and shortness of breath.   Cardiovascular: Negative for chest pain and leg swelling.  Gastrointestinal: Negative for nausea, vomiting, abdominal pain and diarrhea.  Genitourinary: Negative for dysuria, urgency, frequency, flank pain and difficulty urinating.  Musculoskeletal: Negative for back pain.  Skin: Negative for rash.  Neurological: Negative for focal weakness, loss of consciousness, syncope, weakness, numbness and headaches.  All other systems reviewed and are negative.    Allergies  Review of patient's allergies indicates no known allergies.  Home Medications   Current Outpatient Rx  Name  Route  Sig  Dispense  Refill  . amLODipine (NORVASC) 10 MG tablet   Oral   Take 5 mg by mouth daily.           . Ascorbic Acid (VITAMIN C) 500 MG tablet   Oral   Take 500 mg  by mouth 2 (two) times daily.          Marland Kitchen aspirin 81 MG chewable tablet   Oral   Chew 81 mg by mouth daily.           . clobetasol cream (TEMOVATE) 0.05 %   Topical   Apply 1 application topically 2 (two) times daily.          Marland Kitchen diltiazem (DILACOR XR) 240 MG 24 hr capsule   Oral   Take 240 mg by mouth daily.           . finasteride (PROSCAR) 5 MG tablet   Oral   Take 5 mg by mouth daily.           . fish oil-omega-3 fatty acids 1000 MG capsule   Oral   Take 2 g by mouth 2 (two) times daily.           Marland Kitchen galantamine (RAZADYNE) 8 MG tablet   Oral   Take 8  mg by mouth 2 (two) times daily.           Marland Kitchen glyBURIDE (DIABETA) 5 MG tablet      2 in the morning and 1 tab at night          . guaiFENesin (MUCINEX) 600 MG 12 hr tablet   Oral   Take 1,200 mg by mouth as needed.           Marland Kitchen levothyroxine (SYNTHROID, LEVOTHROID) 200 MCG tablet   Oral   Take 200 mcg by mouth daily.           Marland Kitchen lisinopril (PRINIVIL,ZESTRIL) 10 MG tablet   Oral   Take 5 mg by mouth daily.          Marland Kitchen loratadine (CLARITIN) 10 MG tablet   Oral   Take 10 mg by mouth daily.           . metoclopramide (REGLAN) 10 MG tablet   Oral   Take 10 mg by mouth at bedtime.           . niacin 500 MG tablet   Oral   Take 1,000 mg by mouth at bedtime.           . nitroGLYCERIN (NITROSTAT) 0.4 MG SL tablet   Sublingual   Place 0.4 mg under the tongue every 5 (five) minutes as needed.           Marland Kitchen omeprazole (PRILOSEC) 20 MG capsule   Oral   Take 20 mg by mouth 2 (two) times daily.           Marland Kitchen oxybutynin (DITROPAN) 5 MG tablet   Oral   Take 5 mg by mouth daily.           Marland Kitchen PRESCRIPTION MEDICATION   Inhalation   Inhale 2 puffs into the lungs 2 (two) times daily as needed (wheezing).         . rosuvastatin (CRESTOR) 10 MG tablet   Oral   Take 5 mg by mouth daily.           Marland Kitchen senna (SENOKOT) 8.6 MG tablet   Oral   Take 1 tablet by mouth daily.           . traMADol (ULTRAM) 50 MG tablet   Oral   Take 50 mg by mouth 2 (two) times daily.           . Vitamin D, Ergocalciferol, (DRISDOL) 50000 UNITS CAPS   Oral  Take 50,000 Units by mouth every 7 (seven) days.           Marland Kitchen warfarin (COUMADIN) 5 MG tablet   Oral   Take 1 tablet (5 mg total) by mouth daily.   35 tablet   3     BP 137/72  Pulse 61  Temp(Src) 97.9 F (36.6 C) (Oral)  Resp 12  SpO2 93%  Physical Exam  Nursing note and vitals reviewed. Constitutional: He is oriented to person, place, and time. He appears well-developed and well-nourished. No distress.  HENT:   Head: Normocephalic and atraumatic.  Mouth/Throat: Oropharynx is clear and moist.  4 cm laceration to the right side of his forehead, involves the superior aspect of the right eyebrow. Bleeding moderately. There is no damage to underlying major blood vessels, nerves, tendons, muscles. No foreign body. It is not grossly contaminated.  Eyes: Conjunctivae and EOM are normal. Pupils are equal, round, and reactive to light. No scleral icterus.  Neck: Normal range of motion. Neck supple. No JVD present.  Cardiovascular: Normal rate, regular rhythm, normal heart sounds and intact distal pulses.  Exam reveals no gallop and no friction rub.   No murmur heard. Pulmonary/Chest: Effort normal and breath sounds normal. No respiratory distress. He has no wheezes. He has no rales.  Abdominal: Soft. Bowel sounds are normal. He exhibits no distension. There is no tenderness. There is no rebound and no guarding.  Musculoskeletal: He exhibits no edema.  Superficial abrasion to the right hand, palmar surface. Superficial abrasion to the left thigh. This 5 out of 5 strength and is able to bear weight his bilateral lower extremity. He has pain to the right elbow, but has full range of motion, no bony tenderness, neurovascular intact distally with a 1+ radial pulse, intact sensation to light touch. Otherwise atraumatic exam.  Neurological: He is alert and oriented to person, place, and time. No cranial nerve deficit. He exhibits normal muscle tone. Coordination normal.  Skin: Skin is warm and dry. He is not diaphoretic.    ED Course  LACERATION REPAIR Date/Time: 05/12/2013 11:42 PM Performed by: Toney Sang Authorized by: Toney Sang Consent: Verbal consent obtained. Consent given by: patient Patient identity confirmed: verbally with patient Body area: head/neck Location details: forehead Laceration length: 4 cm Foreign bodies: no foreign bodies Tendon involvement: none Nerve involvement: none Vascular  damage: no Anesthesia: local infiltration Local anesthetic: lidocaine 1% without epinephrine Preparation: Patient was prepped and draped in the usual sterile fashion. Irrigation solution: saline Amount of cleaning: standard Debridement: none Degree of undermining: none Skin closure: 4-0 nylon Number of sutures: 4 Technique: simple Approximation: close Patient tolerance: Patient tolerated the procedure well with no immediate complications.   (including critical care time)  Labs Reviewed  PROTIME-INR - Abnormal; Notable for the following:    Prothrombin Time 16.6 (*)    All other components within normal limits   Dg Elbow Complete Right  05/12/2013   *RADIOLOGY REPORT*  Clinical Data: Fall, lateral elbow pain.  RIGHT ELBOW - COMPLETE 3+ VIEW  Comparison: None.  Findings: Mild spurring within the elbow joint. No acute bony abnormality.  Specifically, no fracture, subluxation, or dislocation.  Soft tissues are intact.  No joint effusion.  IMPRESSION: No acute bony abnormality.   Original Report Authenticated By: Charlett Nose, M.D.   Dg Forearm Right  05/12/2013   *RADIOLOGY REPORT*  Clinical Data: Fall, pain  RIGHT FOREARM - 2 VIEW  Comparison: None.  Findings: Bones are osteopenic.  Degenerative  changes of the wrist joint.  Peripheral vascular calcifications noted.  Normal alignment without acute displaced fracture.  No soft tissue abnormality or focal swelling.  IMPRESSION: No acute osseous finding.   Original Report Authenticated By: Judie Petit. Miles Costain, M.D.   Ct Head Wo Contrast  05/12/2013   *RADIOLOGY REPORT*  Clinical Data: Fall.  Hit head.  CT HEAD WITHOUT CONTRAST  Technique:  Contiguous axial images were obtained from the base of the skull through the vertex without contrast.  Comparison: None.  Findings: Soft tissue swelling and laceration noted in the right forehead soft tissues.  No underlying calvarial abnormality. No acute intracranial abnormality.  Specifically, no hemorrhage,  hydrocephalus, mass lesion, acute infarction, or significant intracranial injury.  No acute calvarial abnormality.  Air-fluid levels in the maxillary sinuses bilaterally, right greater than left.  Mastoids are clear.  IMPRESSION: No acute intracranial abnormality.  Air-fluid levels in the maxillary sinuses, right greater left suggest acute sinusitis.   Original Report Authenticated By: Charlett Nose, M.D.   Dg Humerus Right  05/12/2013   *RADIOLOGY REPORT*  Clinical Data: Fall, arm pain.  RIGHT HUMERUS - 2+ VIEW  Comparison: None  Findings: Degenerative changes in the right AC joint and to a lesser extent glenohumeral joint. No acute bony abnormality. Specifically, no fracture, subluxation, or dislocation.  Soft tissues are intact.  IMPRESSION: No acute bony abnormality.   Original Report Authenticated By: Charlett Nose, M.D.   Dg Hand Complete Right  05/12/2013   *RADIOLOGY REPORT*  Clinical Data: Fall, wrist and hand pain.  RIGHT HAND - COMPLETE 3+ VIEW  Comparison: None.  Findings: Degenerative changes in the right wrist. Chondrocalcinosis in the triangular fibrocartilage.  Vascular calcifications about the wrist.  No fracture, subluxation or dislocation.  IMPRESSION: No acute bony abnormality.   Original Report Authenticated By: Charlett Nose, M.D.     1. Laceration of head, initial encounter   2. Closed head injury, initial encounter   3. Anticoagulated on Coumadin       MDM  78 yo M who takes coumadin had a mechanical fall when he stepped off a curb and suffered a lac to his right forehead.  Also with right elbow pain.  INR subtherapeutic.  CT head with NAICA. R arm XR without acute injury. Don't suspect occult injury.  Patient able to ambulate without pain.  Repaired laceraiton at bedside.  Updated tetanus.  Sutures out in 5-7 days.  Stable for discharge.  Gave return precautions.        Toney Sang, MD 05/12/13 [phone]

## 2013-05-13 NOTE — ED Provider Notes (Signed)
I saw and evaluated the patient, reviewed the resident's note and I agree with the findings and plan. The patient presents after a fall.  He was stepping down from a curb when he tripped and fell, striking his head on the ground.  This caused a laceration and abrasion to the right forehead.  There is no loc and he denies headache.  He is anticoagulated on coumadin.  On exam, the vitals are stable and the patient is afebrile.  The heart is regular rate and rhythm.  The lungs are clear.  Neurologically, cranial nerves are intact and extremity strength is 5/5 and symmetrical.  There are abrasions to the right forehead along with a 4 cm laceration above the right eyebrow.    CT of the head was performed and was negative.  The laceration was repaired and wounds were dressed.  He appears stable for discharge to home.  Geoffery Lyons, MD 05/13/13 [phone]

## 2013-05-14 ENCOUNTER — Ambulatory Visit (INDEPENDENT_AMBULATORY_CARE_PROVIDER_SITE_OTHER): Payer: Medicare Other | Admitting: *Deleted

## 2013-05-14 DIAGNOSIS — I4891 Unspecified atrial fibrillation: Secondary | ICD-10-CM

## 2013-05-14 DIAGNOSIS — Z7901 Long term (current) use of anticoagulants: Secondary | ICD-10-CM

## 2013-05-25 ENCOUNTER — Ambulatory Visit (INDEPENDENT_AMBULATORY_CARE_PROVIDER_SITE_OTHER): Payer: Medicare Other | Admitting: *Deleted

## 2013-05-25 DIAGNOSIS — I4891 Unspecified atrial fibrillation: Secondary | ICD-10-CM

## 2013-05-25 DIAGNOSIS — Z7901 Long term (current) use of anticoagulants: Secondary | ICD-10-CM

## 2013-06-09 ENCOUNTER — Ambulatory Visit (HOSPITAL_COMMUNITY)
Admission: RE | Admit: 2013-06-09 | Discharge: 2013-06-09 | Disposition: A | Discharge status: Home / Self Care | Payer: Medicare Other | Source: Ambulatory Visit | Attending: Pulmonary Disease | Admitting: Pulmonary Disease

## 2013-06-09 ENCOUNTER — Other Ambulatory Visit (HOSPITAL_COMMUNITY): Payer: Self-pay | Admitting: Pulmonary Disease

## 2013-06-09 DIAGNOSIS — M79609 Pain in unspecified limb: Secondary | ICD-10-CM | POA: Insufficient documentation

## 2013-06-09 DIAGNOSIS — M79641 Pain in right hand: Secondary | ICD-10-CM

## 2013-06-09 DIAGNOSIS — M11249 Other chondrocalcinosis, unspecified hand: Secondary | ICD-10-CM | POA: Insufficient documentation

## 2013-06-10 ENCOUNTER — Telehealth: Payer: Self-pay | Admitting: *Deleted

## 2013-06-10 NOTE — Telephone Encounter (Signed)
On Doxycycline 100mg  BID for bronchitis.  Started yesterday.  Has appt for INR check on 6/30.  Will continue coumadin 5mg  daily till then.

## 2013-06-10 NOTE — Telephone Encounter (Signed)
PT HAS BEEN PUT ON DOXY-CYCLHYCL 100 MG FOR 10 DAYS.

## 2013-06-15 ENCOUNTER — Ambulatory Visit (INDEPENDENT_AMBULATORY_CARE_PROVIDER_SITE_OTHER): Payer: Medicare Other | Admitting: *Deleted

## 2013-06-15 DIAGNOSIS — Z7901 Long term (current) use of anticoagulants: Secondary | ICD-10-CM

## 2013-06-15 DIAGNOSIS — I4891 Unspecified atrial fibrillation: Secondary | ICD-10-CM

## 2013-06-15 LAB — POCT INR: INR: 3.4

## 2013-06-30 ENCOUNTER — Ambulatory Visit (INDEPENDENT_AMBULATORY_CARE_PROVIDER_SITE_OTHER): Payer: Medicare Other | Admitting: Orthopedic Surgery

## 2013-06-30 ENCOUNTER — Encounter: Payer: Self-pay | Admitting: Orthopedic Surgery

## 2013-06-30 VITALS — BP 130/80 | Ht 71.0 in | Wt 185.0 lb

## 2013-06-30 DIAGNOSIS — M19039 Primary osteoarthritis, unspecified wrist: Secondary | ICD-10-CM

## 2013-06-30 DIAGNOSIS — M9230 Other juvenile osteochondrosis, unspecified upper limb: Secondary | ICD-10-CM

## 2013-06-30 DIAGNOSIS — M92211 Osteochondrosis (juvenile) of carpal lunate [Kienbock], right hand: Secondary | ICD-10-CM

## 2013-06-30 DIAGNOSIS — M92219 Osteochondrosis (juvenile) of carpal lunate [Kienbock], unspecified hand: Secondary | ICD-10-CM | POA: Insufficient documentation

## 2013-06-30 MED ORDER — TRAMADOL-ACETAMINOPHEN 37.5-325 MG PO TABS: 1.0000 | ORAL_TABLET | ORAL | Status: DC | PRN

## 2013-06-30 NOTE — Patient Instructions (Addendum)
Wear brace   Take medcation  CT wrist   Stop tramadol , start ultracet, prescription is at the pharmacy

## 2013-06-30 NOTE — Progress Notes (Signed)
  Subjective:    Caleb VANCOTT Sr. is an 78 y.o. male who presents for evaluation pain in his right hand and wrist after a fall on May 27 of this year. He denies any previous history of wrist or thumb pain or hand pain until the fall. He had an x-ray was read as negative he persisted with pain and swelling dull aching throbbing in the thumb area radial side of the wrist which she rates 6/10. He is on tramadol no relief of pain he reports catching it on ability to use his wrist and hand  The following portions of the patient's history were reviewed and updated as appropriate: allergies, current medications, past family history, past medical history, past social history, past surgical history and problem list.  Review of Systems A comprehensive review of systems was negative except for: Fatigue, shortness of breath with cough, heartburn, nervousness, easy bleeding, excessive thirst, seasonal allergies, redness of the skin joint pain and swelling related to the wrist.   Objective:    BP 130/80  Ht 5\' 11"  (1.803 m)  Wt 185 lb (83.915 kg)  BMI 25.81 kg/m2       Overall the patient's appearance is normal in terms of development, nutrition and body habitus is not excessively overweight. He has adequate grooming.  His radial pulses normal. Skin is warm dry and intact with a warm extremity to touch. He has no abnormalities in terms of his walking.  His wrist is tender and swollen he has pain for pronation supination but especially extension and flexion is tender over the wrist joint and radial styloid. His range of motion is decreased. His wrist stability is normal. His muscle tone is good he has no atrophy of the hand. Skin as stated. He has normal sensation in the hand. He is oriented x3 his mood and affect are normal   Imaging: X-ray was taken at the hospital he essentially has some radiocarpal arthritis with possible radial styloid fracture and ostial lysis of the lunate on the radial side  there is questionable chondrocalcinosis.  Assessment:    Kienbock's disease with radiocarpal arthritis possible styloid fracture on the right side   Plan:    Brace wrist, Ultracet for pain, CT scan

## 2013-07-13 ENCOUNTER — Telehealth: Payer: Self-pay | Admitting: Radiology

## 2013-07-13 NOTE — Telephone Encounter (Signed)
I called to give the patient his MRI appointment and his family informed me that he is in the hospital. I told her to call me when he is able to go for his MRI.

## 2013-07-15 ENCOUNTER — Ambulatory Visit (HOSPITAL_COMMUNITY): Payer: Medicare Other

## 2013-07-21 ENCOUNTER — Other Ambulatory Visit: Payer: Self-pay | Admitting: Cardiology

## 2013-07-22 ENCOUNTER — Ambulatory Visit (INDEPENDENT_AMBULATORY_CARE_PROVIDER_SITE_OTHER): Payer: Medicare Other | Admitting: *Deleted

## 2013-07-22 ENCOUNTER — Ambulatory Visit (HOSPITAL_COMMUNITY)
Admission: RE | Admit: 2013-07-22 | Discharge: 2013-07-22 | Disposition: A | Discharge status: Home / Self Care | Payer: Medicare Other | Source: Ambulatory Visit | Attending: Orthopedic Surgery | Admitting: Orthopedic Surgery

## 2013-07-22 DIAGNOSIS — M92211 Osteochondrosis (juvenile) of carpal lunate [Kienbock], right hand: Secondary | ICD-10-CM

## 2013-07-22 DIAGNOSIS — M19039 Primary osteoarthritis, unspecified wrist: Secondary | ICD-10-CM | POA: Insufficient documentation

## 2013-07-22 DIAGNOSIS — I4891 Unspecified atrial fibrillation: Secondary | ICD-10-CM

## 2013-07-22 DIAGNOSIS — Z7901 Long term (current) use of anticoagulants: Secondary | ICD-10-CM

## 2013-08-03 ENCOUNTER — Ambulatory Visit (INDEPENDENT_AMBULATORY_CARE_PROVIDER_SITE_OTHER): Payer: Medicare Other | Admitting: Orthopedic Surgery

## 2013-08-03 MED ORDER — DICLOFENAC SODIUM 1 % TD GEL: 2.0000 g | Freq: Four times a day (QID) | TRANSDERMAL | Status: DC

## 2013-08-03 NOTE — Progress Notes (Signed)
Patient ID: Caleb BON Sr., male   DOB: 1935/07/22, 78 y.o.   MRN: [id number]  Chief Complaint  Patient presents with  . Results    CT Scan Results    BP 126/68  Ht 5\' 11"  (1.803 m)  Wt 185 lb (83.915 kg)  BMI 25.81 kg/m2  Caleb MOSCO Sr. is an 78 y.o. male who presents for evaluation pain in his right hand and wrist after a fall on May 27 of this year. He denies any previous history of wrist or thumb pain or hand pain until the fall. He had an x-ray was read as negative he persisted with pain and swelling dull aching throbbing in the thumb area radial side of the wrist which she rates 6/10. He is on tramadol no relief of pain he reports catching it on ability to use his wrist and hand Kienbock's disease with radiocarpal arthritis possible styloid fracture on the right side   Ros thyroid disease   S/P THYROID EXCISION   CT REVIEW Healing fracture of the radial styloid. 2.  No evidence of Kienbock's disease. 3.  Widened scapholunate interval as demonstrated on prior radiographs, consistent with an underlying scapholunate ligament tear.  There is dorsal tilting of the lunate consistent with developing dorsal intercalated segmental instability. Kienbock's disease with radiocarpal arthritis possible styloid fracture on the right side  Caleb RAGSDALE Sr. is an 78 y.o. male who presents for evaluation pain in his right hand and wrist after a fall on May 27 of this year. He denies any previous history of wrist or thumb pain or hand pain until the fall. He had an x-ray was read as negative he persisted with pain and swelling dull aching throbbing in the thumb area radial side of the wrist which she rates 6/10. He is on tramadol no relief of pain he reports catching it on ability to use his wrist and hand  APPLY VOLTAREN GEL 4 X A DAY

## 2013-08-03 NOTE — Patient Instructions (Addendum)
Pick up prescription at [facility]   Call in 3 months to have [place] make referral to Hand surgeon

## 2013-08-12 ENCOUNTER — Ambulatory Visit (INDEPENDENT_AMBULATORY_CARE_PROVIDER_SITE_OTHER): Payer: Medicare Other | Admitting: *Deleted

## 2013-08-12 ENCOUNTER — Ambulatory Visit (INDEPENDENT_AMBULATORY_CARE_PROVIDER_SITE_OTHER): Payer: Medicare Other | Admitting: Cardiovascular Disease

## 2013-08-12 ENCOUNTER — Encounter: Payer: Self-pay | Admitting: Cardiovascular Disease

## 2013-08-12 VITALS — BP 132/66 | HR 73 | Ht 72.0 in | Wt 184.0 lb

## 2013-08-12 DIAGNOSIS — I4891 Unspecified atrial fibrillation: Secondary | ICD-10-CM

## 2013-08-12 DIAGNOSIS — R0602 Shortness of breath: Secondary | ICD-10-CM

## 2013-08-12 DIAGNOSIS — E782 Mixed hyperlipidemia: Secondary | ICD-10-CM

## 2013-08-12 DIAGNOSIS — I10 Essential (primary) hypertension: Secondary | ICD-10-CM

## 2013-08-12 DIAGNOSIS — Z7901 Long term (current) use of anticoagulants: Secondary | ICD-10-CM

## 2013-08-12 DIAGNOSIS — I2510 Atherosclerotic heart disease of native coronary artery without angina pectoris: Secondary | ICD-10-CM

## 2013-08-12 NOTE — Progress Notes (Signed)
Patient ID: Caleb HEGEMAN Sr., male   DOB: 03/02/1935, 78 y.o.   MRN: [id number] Caleb [name] is seen today for F/U of PAF . He is on chronic coumadin for PAF. His INR's have been ok. He is followed in our coumadin clinic His primary is Caleb [name]. He quit smoking in 99 and has apparantly had TB with mild chronic dyspnea. CRF's modified include HTN, DM and elevated lipids. Compliant with meds. Has horrible psoriasis and needs to get more "lotion" from [place]. Warned him that if he lets this go he can get bad cellulitis or sepsis Denies SSCP, palpitations, edema or syncope  Lungs are bad. Exertional dyspnea stable. Chronic bronchitic cough. [facility] and Caleb [name] Had CXR this year.  Two sons live with him and help out  Had thyroid surgery a few months ago Wondering if he needs to be on calcium carbonate still  ROS: Denies fever, malais, weight loss, blurry vision, decreased visual acuity, cough, sputum, SOB, hemoptysis, pleuritic pain, palpitaitons, heartburn, abdominal pain, melena, lower extremity edema, claudication, or rash.  All other systems reviewed and negative  General: Affect appropriate Frail elderly male HEENT: normal Neck supple with no adenopathy JVP normal no bruits no thyromegaly Lungs diffuse rhonchi and wheezing  and good diaphragmatic motion Heart:  S1/S2 no murmur, no rub, gallop or click PMI normal Abdomen: benighn, BS positve, no tenderness, no AAA no bruit.  No HSM or HJR Distal pulses intact with no bruits No edema Neuro non-focal Skin warm and dry No muscular weakness   Current Outpatient Prescriptions  Medication Sig Dispense Refill  . amLODipine (NORVASC) 10 MG tablet Take 5 mg by mouth daily.        . Ascorbic Acid (VITAMIN C) 500 MG tablet Take 500 mg by mouth 2 (two) times daily.       Marland Kitchen aspirin 81 MG chewable tablet Chew 81 mg by mouth daily.        . calcium carbonate (OS-CAL) 600 MG TABS tablet Take 600 mg by mouth 3 (three) times daily with meals.      .  clobetasol cream (TEMOVATE) 0.05 % Apply 1 application topically 2 (two) times daily.       . diclofenac sodium (VOLTAREN) 1 % GEL Apply 2 g topically 4 (four) times daily.  5 Tube  3  . diltiazem (DILACOR XR) 240 MG 24 hr capsule Take 240 mg by mouth daily.        . finasteride (PROSCAR) 5 MG tablet Take 5 mg by mouth daily.        . fish oil-omega-3 fatty acids 1000 MG capsule Take 2 g by mouth 2 (two) times daily.        Marland Kitchen galantamine (RAZADYNE) 8 MG tablet Take 8 mg by mouth 2 (two) times daily.        Marland Kitchen glyBURIDE (DIABETA) 5 MG tablet 2 in the morning and 1 tab at night       . guaiFENesin (MUCINEX) 600 MG 12 hr tablet Take 1,200 mg by mouth as needed.        Marland Kitchen lisinopril (PRINIVIL,ZESTRIL) 10 MG tablet Take 5 mg by mouth daily.       Marland Kitchen loratadine (CLARITIN) 10 MG tablet Take 10 mg by mouth daily.        . magnesium oxide (MAG-OX) 400 MG tablet Take 400 mg by mouth daily.      . meclizine (ANTIVERT) 25 MG tablet       . nitroGLYCERIN (  NITROSTAT) 0.4 MG SL tablet Place 0.4 mg under the tongue every 5 (five) minutes as needed.        Marland Kitchen omeprazole (PRILOSEC) 20 MG capsule Take 20 mg by mouth 2 (two) times daily.        Marland Kitchen oxybutynin (DITROPAN) 5 MG tablet Take 5 mg by mouth daily.        Marland Kitchen PRESCRIPTION MEDICATION Inhale 2 puffs into the lungs 2 (two) times daily as needed (wheezing).      . rosuvastatin (CRESTOR) 10 MG tablet Take 5 mg by mouth daily.        . traMADol (ULTRAM) 50 MG tablet Take 50 mg by mouth 2 (two) times daily.        . Vitamin D, Ergocalciferol, (DRISDOL) 50000 UNITS CAPS Take 50,000 Units by mouth every 7 (seven) days.        Marland Kitchen warfarin (COUMADIN) 5 MG tablet Take 1 tablet (5 mg total) by mouth daily.  35 tablet  3   No current facility-administered medications for this visit.    Allergies  Review of patient's allergies indicates no known allergies.  Electrocardiogram:  5/27  SR rate 70 normal ECG   Assessment and Plan

## 2013-08-12 NOTE — Assessment & Plan Note (Signed)
Cholesterol is at goal.  Continue current dose of statin and diet Rx.  No myalgias or side effects.  F/U  LFT's in 6 months. No results found for this basename: LDLCALC   Labs with primary next month

## 2013-08-12 NOTE — Patient Instructions (Addendum)
Your physician recommends that you schedule a follow-up appointment in: 1 year You will receive a reminder letter two months in advance reminding you to call and schedule your appointment. If you don't receive this letter, please contact our office.  Your physician recommends that you return for lab work today. CMP

## 2013-08-12 NOTE — Assessment & Plan Note (Signed)
Maint NSR  F/u coumadin clinic No bleeding problems

## 2013-08-12 NOTE — Assessment & Plan Note (Signed)
Stable with no angina and good activity level.  Continue medical Rx  

## 2013-08-12 NOTE — Assessment & Plan Note (Signed)
Biggest clinical problem is chronic lung disease.  F/U [name] Consider walk test for home oxygen assessment

## 2013-08-12 NOTE — Assessment & Plan Note (Signed)
Well controlled.  Continue current medications and low sodium Dash type diet.   [facility]  Would consider increasing ACE and stopping one of his calcium blockers

## 2013-08-13 ENCOUNTER — Telehealth: Payer: Self-pay | Admitting: Cardiovascular Disease

## 2013-08-13 LAB — COMPREHENSIVE METABOLIC PANEL
Albumin: 4 g/dL (ref 3.5–5.2)
Alkaline Phosphatase: 115 U/L (ref 39–117)
Calcium: 8.3 mg/dL — ABNORMAL LOW (ref 8.4–10.5)
Chloride: 105 mEq/L (ref 96–112)
Glucose, Bld: 166 mg/dL — ABNORMAL HIGH (ref 70–99)
Potassium: 4.7 mEq/L (ref 3.5–5.3)
Sodium: 138 mEq/L (ref 135–145)
Total Protein: 6.2 g/dL (ref 6.0–8.3)

## 2013-08-13 NOTE — Telephone Encounter (Signed)
F/up  Pt returning call// Not sure of the nature of the call.

## 2013-08-13 NOTE — Telephone Encounter (Signed)
F/up  ° °Pt returning call// test results  °

## 2013-08-13 NOTE — Telephone Encounter (Signed)
PT AWARE OF LAB RESULTS./CY 

## 2013-08-23 ENCOUNTER — Encounter (HOSPITAL_COMMUNITY): Payer: Self-pay | Admitting: Emergency Medicine

## 2013-08-23 DIAGNOSIS — R0789 Other chest pain: Secondary | ICD-10-CM | POA: Insufficient documentation

## 2013-08-23 DIAGNOSIS — Z8709 Personal history of other diseases of the respiratory system: Secondary | ICD-10-CM | POA: Insufficient documentation

## 2013-08-23 DIAGNOSIS — Z9861 Coronary angioplasty status: Secondary | ICD-10-CM | POA: Insufficient documentation

## 2013-08-23 DIAGNOSIS — R220 Localized swelling, mass and lump, head: Secondary | ICD-10-CM | POA: Insufficient documentation

## 2013-08-23 DIAGNOSIS — R209 Unspecified disturbances of skin sensation: Secondary | ICD-10-CM | POA: Insufficient documentation

## 2013-08-23 DIAGNOSIS — Z86711 Personal history of pulmonary embolism: Secondary | ICD-10-CM | POA: Insufficient documentation

## 2013-08-23 DIAGNOSIS — E785 Hyperlipidemia, unspecified: Secondary | ICD-10-CM | POA: Insufficient documentation

## 2013-08-23 DIAGNOSIS — I2510 Atherosclerotic heart disease of native coronary artery without angina pectoris: Secondary | ICD-10-CM | POA: Insufficient documentation

## 2013-08-23 DIAGNOSIS — Z791 Long term (current) use of non-steroidal anti-inflammatories (NSAID): Secondary | ICD-10-CM | POA: Insufficient documentation

## 2013-08-23 DIAGNOSIS — N400 Enlarged prostate without lower urinary tract symptoms: Secondary | ICD-10-CM | POA: Insufficient documentation

## 2013-08-23 DIAGNOSIS — Z79899 Other long term (current) drug therapy: Secondary | ICD-10-CM | POA: Insufficient documentation

## 2013-08-23 DIAGNOSIS — Z7901 Long term (current) use of anticoagulants: Secondary | ICD-10-CM | POA: Insufficient documentation

## 2013-08-23 DIAGNOSIS — Z7982 Long term (current) use of aspirin: Secondary | ICD-10-CM | POA: Insufficient documentation

## 2013-08-23 DIAGNOSIS — Z87891 Personal history of nicotine dependence: Secondary | ICD-10-CM | POA: Insufficient documentation

## 2013-08-23 DIAGNOSIS — Z8611 Personal history of tuberculosis: Secondary | ICD-10-CM | POA: Insufficient documentation

## 2013-09-02 ENCOUNTER — Telehealth: Payer: Self-pay | Admitting: *Deleted

## 2013-09-02 ENCOUNTER — Ambulatory Visit (INDEPENDENT_AMBULATORY_CARE_PROVIDER_SITE_OTHER): Payer: Medicare Other | Admitting: *Deleted

## 2013-09-02 DIAGNOSIS — I4891 Unspecified atrial fibrillation: Secondary | ICD-10-CM

## 2013-09-02 DIAGNOSIS — Z7901 Long term (current) use of anticoagulants: Secondary | ICD-10-CM

## 2013-09-02 LAB — POCT INR: INR: 2.2

## 2013-09-02 NOTE — Telephone Encounter (Signed)
Pt came in for a PT/INR check and advised he had an allergic reaction to Levaquin that he was placed on in the hospital, the pt noted he stopped taking the medication per swelling noted all over his body, added allergy to list and removed from the medication list

## 2013-09-09 ENCOUNTER — Other Ambulatory Visit: Payer: Self-pay | Admitting: Cardiology

## 2013-10-15 ENCOUNTER — Telehealth: Payer: Self-pay | Admitting: Orthopedic Surgery

## 2013-10-15 NOTE — Telephone Encounter (Signed)
Patient called as per last office visit note 08/03/13, to  "Call in 3 months to have [place] make referral to Hand surgeon".  Patient requests that the referral appointment be made after 10/30/13, due to other medical appointments scheduled. Please contact patient at Ph# [phone].

## 2013-10-19 ENCOUNTER — Other Ambulatory Visit: Payer: Self-pay | Admitting: *Deleted

## 2013-10-19 DIAGNOSIS — M25531 Pain in right wrist: Secondary | ICD-10-CM

## 2013-10-19 NOTE — Telephone Encounter (Signed)
Referral and office notes faxed to [facility]. Awaiting appointment. 

## 2013-10-20 NOTE — Telephone Encounter (Signed)
Patient has an appointment with Dr. Amanda Pea 12/14/13 at 9:30 am. Patient is aware.

## 2014-06-26 ENCOUNTER — Encounter (HOSPITAL_COMMUNITY): Payer: Self-pay | Admitting: Emergency Medicine

## 2014-06-26 ENCOUNTER — Emergency Department (HOSPITAL_COMMUNITY): Payer: Medicare Other

## 2014-06-26 ENCOUNTER — Emergency Department (HOSPITAL_COMMUNITY)
Admission: EM | Admit: 2014-06-26 | Discharge: 2014-06-27 | Disposition: A | Discharge status: Home / Self Care | Payer: Medicare Other | Attending: Emergency Medicine | Admitting: Emergency Medicine

## 2014-06-26 DIAGNOSIS — Z79899 Other long term (current) drug therapy: Secondary | ICD-10-CM | POA: Insufficient documentation

## 2014-06-26 DIAGNOSIS — Z7901 Long term (current) use of anticoagulants: Secondary | ICD-10-CM | POA: Insufficient documentation

## 2014-06-26 DIAGNOSIS — Z87891 Personal history of nicotine dependence: Secondary | ICD-10-CM | POA: Insufficient documentation

## 2014-06-26 DIAGNOSIS — J189 Pneumonia, unspecified organism: Secondary | ICD-10-CM

## 2014-06-26 DIAGNOSIS — Z792 Long term (current) use of antibiotics: Secondary | ICD-10-CM | POA: Insufficient documentation

## 2014-06-26 DIAGNOSIS — R042 Hemoptysis: Secondary | ICD-10-CM | POA: Insufficient documentation

## 2014-06-26 DIAGNOSIS — Z791 Long term (current) use of non-steroidal anti-inflammatories (NSAID): Secondary | ICD-10-CM | POA: Insufficient documentation

## 2014-06-26 DIAGNOSIS — E119 Type 2 diabetes mellitus without complications: Secondary | ICD-10-CM | POA: Insufficient documentation

## 2014-06-26 DIAGNOSIS — R791 Abnormal coagulation profile: Secondary | ICD-10-CM | POA: Insufficient documentation

## 2014-06-26 DIAGNOSIS — E785 Hyperlipidemia, unspecified: Secondary | ICD-10-CM | POA: Insufficient documentation

## 2014-06-26 DIAGNOSIS — J159 Unspecified bacterial pneumonia: Secondary | ICD-10-CM | POA: Insufficient documentation

## 2014-06-26 DIAGNOSIS — Z7982 Long term (current) use of aspirin: Secondary | ICD-10-CM | POA: Insufficient documentation

## 2014-06-26 DIAGNOSIS — Z87448 Personal history of other diseases of urinary system: Secondary | ICD-10-CM | POA: Insufficient documentation

## 2014-06-26 DIAGNOSIS — Z8611 Personal history of tuberculosis: Secondary | ICD-10-CM | POA: Insufficient documentation

## 2014-06-26 DIAGNOSIS — I2510 Atherosclerotic heart disease of native coronary artery without angina pectoris: Secondary | ICD-10-CM | POA: Insufficient documentation

## 2014-06-26 HISTORY — DX: Type 2 diabetes mellitus without complications: E11.9

## 2014-06-26 NOTE — ED Provider Notes (Signed)
CSN: [id number]     Arrival date & time 06/26/14  2254 History  This chart was scribed for Caleb Acosta, MD by Girtha Hake, ED Scribe. The patient was seen in APA10/APA10. The patient's care was started at 12:24 AM.     Chief Complaint  Patient presents with  . coughing up blood     HPI HPI Comments: Caleb CRAINE Sr. is a 79 y.o. male who presents to the Emergency Department complaining of coughing up beginning two hours ago. Has resolved by the time of evaluation. Was coughing up bright red sputum, small amount X several episodes. No associated SOB, fever, or chills. Coumadin check two weeks supra therapeutic. Held one dose. Resumed meds, then added a Gout medication this week. States he is comfortable now.       Past Medical History  Diagnosis Date  . Acute exacerbation of chronic bronchitis   . History of tuberculosis   . CAD (coronary artery disease)   . Hyperlipidemia   . BPH (benign prostatic hypertrophy)   . Thyroid disease     hyperthyroidism  . Diabetes mellitus without complication    Past Surgical History  Procedure Laterality Date  . Cholecystectomy    . Tyroidectomy     Family History  Problem Relation Age of Onset  . Heart attack Father    History  Substance Use Topics  . Smoking status: Former Research scientist (life sciences)  . Smokeless tobacco: Not on file  . Alcohol Use: No    Review of Systems  All other systems reviewed and are negative.     Allergies  Levaquin  Home Medications   Prior to Admission medications   Medication Sig Start Date End Date Taking? Authorizing Provider  amLODipine (NORVASC) 10 MG tablet Take 5 mg by mouth daily.      Historical Provider, MD  Ascorbic Acid (VITAMIN C) 500 MG tablet Take 500 mg by mouth 2 (two) times daily.     Historical Provider, MD  aspirin 81 MG chewable tablet Chew 81 mg by mouth daily.      Historical Provider, MD  azithromycin (ZITHROMAX Z-PAK) 250 MG tablet Take 1 tablet (250 mg total) by mouth daily. 500mg   PO day 1, then 250mg  PO days 205 06/27/14   Caleb Acosta, MD  calcium carbonate (OS-CAL) 600 MG TABS tablet Take 600 mg by mouth 3 (three) times daily with meals.    Historical Provider, MD  cefdinir (OMNICEF) 300 MG capsule Take 1 capsule (300 mg total) by mouth 2 (two) times daily. 06/27/14   Caleb Acosta, MD  clobetasol cream (TEMOVATE) 1.95 % Apply 1 application topically 2 (two) times daily.  07/21/12   Historical Provider, MD  diclofenac sodium (VOLTAREN) 1 % GEL Apply 2 g topically 4 (four) times daily. 08/03/13   Carole Civil, MD  diltiazem (DILACOR XR) 240 MG 24 hr capsule Take 240 mg by mouth daily.      Historical Provider, MD  finasteride (PROSCAR) 5 MG tablet Take 5 mg by mouth daily.      Historical Provider, MD  fish oil-omega-3 fatty acids 1000 MG capsule Take 2 g by mouth 2 (two) times daily.      Historical Provider, MD  galantamine (RAZADYNE) 8 MG tablet Take 8 mg by mouth 2 (two) times daily.      Historical Provider, MD  glyBURIDE (DIABETA) 5 MG tablet 2 in the morning and 1 tab at night     Historical Provider, MD  guaiFENesin (MUCINEX) 600 MG 12 hr tablet Take 1,200 mg by mouth as needed.      Historical Provider, MD  lisinopril (PRINIVIL,ZESTRIL) 10 MG tablet Take 5 mg by mouth daily.     Historical Provider, MD  loratadine (CLARITIN) 10 MG tablet Take 10 mg by mouth daily.      Historical Provider, MD  magnesium oxide (MAG-OX) 400 MG tablet Take 400 mg by mouth daily.    Historical Provider, MD  meclizine (ANTIVERT) 25 MG tablet  05/18/13   Historical Provider, MD  nitroGLYCERIN (NITROSTAT) 0.4 MG SL tablet Place 0.4 mg under the tongue every 5 (five) minutes as needed.      Historical Provider, MD  omeprazole (PRILOSEC) 20 MG capsule Take 20 mg by mouth 2 (two) times daily.      Historical Provider, MD  oxybutynin (DITROPAN) 5 MG tablet Take 5 mg by mouth daily.      Historical Provider, MD  PRESCRIPTION MEDICATION Inhale 2 puffs into the lungs 2 (two) times daily as  needed (wheezing).    Historical Provider, MD  rosuvastatin (CRESTOR) 10 MG tablet Take 5 mg by mouth daily.      Historical Provider, MD  traMADol (ULTRAM) 50 MG tablet Take 50 mg by mouth 2 (two) times daily.      Historical Provider, MD  Vitamin D, Ergocalciferol, (DRISDOL) 50000 UNITS CAPS Take 50,000 Units by mouth every 7 (seven) days.      Historical Provider, MD  warfarin (COUMADIN) 5 MG tablet TAKE 1 TABLET (5 MG TOTAL) BY MOUTH DAILY. 09/09/13   Herminio Commons, MD   Triage Vitals: BP 130/64  Pulse 71  Temp(Src) 97.4 F (36.3 C) (Oral)  Resp 24  Ht 5\' 9"  (1.753 m)  Wt 217 lb (98.431 kg)  BMI 32.03 kg/m2  SpO2 92% Physical Exam  Nursing note and vitals reviewed. Constitutional: He is oriented to person, place, and time. He appears well-developed and well-nourished. No distress.  HENT:  Head: Normocephalic and atraumatic.  Mouth/Throat: Oropharynx is clear and moist.  Oropharynx is clear without any signs of blood.   Eyes: Conjunctivae and EOM are normal.  Neck: Neck supple. No tracheal deviation present.  Cardiovascular: Normal rate.   No murmur heard. A-fib. Strong pulses.  Pulmonary/Chest: Effort normal. No respiratory distress. He has no wheezes. He has no rales.  Speaks in full sentences without distress or increased WOB.  Musculoskeletal: Normal range of motion.  No peripheral edema.   Neurological: He is alert and oriented to person, place, and time.  Skin: Skin is warm and dry.  Psychiatric: He has a normal mood and affect. His behavior is normal.    ED Course  Procedures (including critical care time) DIAGNOSTIC STUDIES: Oxygen Saturation is 92% on room air, normal by my interpretation.    COORDINATION OF CARE: 12:30 AM-Suspect Coumadin, Gout infection or PE, labs.    Labs Review Labs Reviewed  CBC WITH DIFFERENTIAL - Abnormal; Notable for the following:    WBC 10.6 (*)    Hemoglobin 12.8 (*)    Neutrophils Relative % 78 (*)    Neutro Abs 8.2 (*)     All other components within normal limits  PROTIME-INR - Abnormal; Notable for the following:    Prothrombin Time 33.4 (*)    INR 3.28 (*)    All other components within normal limits    Imaging Review Dg Chest 2 View  06/27/2014   CLINICAL DATA:  Bloody sputum for 1 day.  History of TB.  EXAM: CHEST  2 VIEW  COMPARISON:  01/08/2012  FINDINGS: Borderline heart size with normal pulmonary vascularity. Emphysematous changes in the lungs with scattered fibrosis and chronic bronchitic changes. There appears to be superimposed acute nodular infiltration in the left lower lung. This could be due to parenchymal hemorrhage. Reactivated TB or acute pneumonia cannot be excluded. Bilateral central bronchiectasis. Stable appearance of right lateral pleural thickening.  IMPRESSION: Chronic emphysematous and fibrotic changes with old right pleural thickening. Suggestion of superimposed nodular airspace disease in the left lower lung. Bronchiectasis.   Electronically Signed   By: Lucienne Capers M.D.   On: 06/27/2014 00:26    Pt aware he has afib - states this is why he is on coumadin  MDM   Final diagnoses:  Hemoptysis  Supratherapeutic INR  CAP (community acquired pneumonia)    Pt has no further hemoptysis since arrival - xray shows early infiltrate - pt informed and will be started on meds here with Rocephin - d/w pharmacist - omnicef and z pak with holding one dose of coumadin - will f/u with Dr. Luan Pulling on Monday - pt agreeable to d/c and appears stable.  I personally performed the services described in this documentation, which was scribed in my presence. The recorded information has been reviewed and is accurate.      Caleb Acosta, MD 06/27/14 [phone]

## 2014-06-26 NOTE — ED Notes (Signed)
Pt woke at 2200 coughing up blood.  History of TB at 75-79 y.o.  On coumadin.

## 2014-06-27 ENCOUNTER — Emergency Department (HOSPITAL_COMMUNITY): Payer: Medicare Other

## 2014-06-27 ENCOUNTER — Encounter (HOSPITAL_COMMUNITY): Payer: Self-pay | Admitting: Emergency Medicine

## 2014-06-27 ENCOUNTER — Inpatient Hospital Stay (HOSPITAL_COMMUNITY)
Admission: EM | Admit: 2014-06-27 | Discharge: 2014-06-30 | DRG: 194 | Disposition: A | Discharge status: Home / Self Care | Payer: Medicare Other | Attending: Pulmonary Disease | Admitting: Pulmonary Disease

## 2014-06-27 DIAGNOSIS — Z7982 Long term (current) use of aspirin: Secondary | ICD-10-CM

## 2014-06-27 DIAGNOSIS — R042 Hemoptysis: Secondary | ICD-10-CM | POA: Diagnosis present

## 2014-06-27 DIAGNOSIS — I959 Hypotension, unspecified: Secondary | ICD-10-CM | POA: Diagnosis present

## 2014-06-27 DIAGNOSIS — J189 Pneumonia, unspecified organism: Principal | ICD-10-CM | POA: Diagnosis present

## 2014-06-27 DIAGNOSIS — R0609 Other forms of dyspnea: Secondary | ICD-10-CM | POA: Diagnosis present

## 2014-06-27 DIAGNOSIS — E119 Type 2 diabetes mellitus without complications: Secondary | ICD-10-CM | POA: Diagnosis present

## 2014-06-27 DIAGNOSIS — Z8611 Personal history of tuberculosis: Secondary | ICD-10-CM

## 2014-06-27 DIAGNOSIS — J441 Chronic obstructive pulmonary disease with (acute) exacerbation: Secondary | ICD-10-CM | POA: Diagnosis present

## 2014-06-27 DIAGNOSIS — J479 Bronchiectasis, uncomplicated: Secondary | ICD-10-CM | POA: Diagnosis present

## 2014-06-27 DIAGNOSIS — Z7901 Long term (current) use of anticoagulants: Secondary | ICD-10-CM

## 2014-06-27 DIAGNOSIS — K219 Gastro-esophageal reflux disease without esophagitis: Secondary | ICD-10-CM | POA: Diagnosis present

## 2014-06-27 DIAGNOSIS — I10 Essential (primary) hypertension: Secondary | ICD-10-CM | POA: Diagnosis present

## 2014-06-27 DIAGNOSIS — E0590 Thyrotoxicosis, unspecified without thyrotoxic crisis or storm: Secondary | ICD-10-CM | POA: Diagnosis present

## 2014-06-27 DIAGNOSIS — R0602 Shortness of breath: Secondary | ICD-10-CM | POA: Diagnosis present

## 2014-06-27 DIAGNOSIS — K921 Melena: Secondary | ICD-10-CM | POA: Diagnosis present

## 2014-06-27 DIAGNOSIS — J449 Chronic obstructive pulmonary disease, unspecified: Secondary | ICD-10-CM | POA: Diagnosis present

## 2014-06-27 DIAGNOSIS — N400 Enlarged prostate without lower urinary tract symptoms: Secondary | ICD-10-CM | POA: Diagnosis present

## 2014-06-27 DIAGNOSIS — Z8249 Family history of ischemic heart disease and other diseases of the circulatory system: Secondary | ICD-10-CM

## 2014-06-27 DIAGNOSIS — E785 Hyperlipidemia, unspecified: Secondary | ICD-10-CM | POA: Diagnosis present

## 2014-06-27 DIAGNOSIS — Z87891 Personal history of nicotine dependence: Secondary | ICD-10-CM

## 2014-06-27 DIAGNOSIS — I2510 Atherosclerotic heart disease of native coronary artery without angina pectoris: Secondary | ICD-10-CM | POA: Diagnosis present

## 2014-06-27 DIAGNOSIS — I4891 Unspecified atrial fibrillation: Secondary | ICD-10-CM | POA: Diagnosis present

## 2014-06-27 HISTORY — DX: Gastro-esophageal reflux disease without esophagitis: K21.9

## 2014-06-27 HISTORY — DX: Diverticulitis of intestine, part unspecified, without perforation or abscess without bleeding: K57.92

## 2014-06-27 LAB — CBC WITH DIFFERENTIAL/PLATELET
BASOS PCT: 0 % (ref 0–1)
Basophils Absolute: 0 10*3/uL (ref 0.0–0.1)
Basophils Absolute: 0 10*3/uL (ref 0.0–0.1)
Basophils Absolute: 0 10*3/uL (ref 0.0–0.1)
Basophils Relative: 0 % (ref 0–1)
Basophils Relative: 0 % (ref 0–1)
EOS ABS: 0 10*3/uL (ref 0.0–0.7)
EOS PCT: 0 % (ref 0–5)
Eosinophils Absolute: 0.1 10*3/uL (ref 0.0–0.7)
Eosinophils Absolute: 0.1 10*3/uL (ref 0.0–0.7)
Eosinophils Relative: 1 % (ref 0–5)
Eosinophils Relative: 1 % (ref 0–5)
HCT: 38.7 % — ABNORMAL LOW (ref 39.0–52.0)
HCT: 39.1 % (ref 39.0–52.0)
HCT: 39.3 % (ref 39.0–52.0)
HEMOGLOBIN: 13.1 g/dL (ref 13.0–17.0)
HEMOGLOBIN: 12.6 g/dL — AB (ref 13.0–17.0)
Hemoglobin: 12.8 g/dL — ABNORMAL LOW (ref 13.0–17.0)
LYMPHS ABS: 1.4 10*3/uL (ref 0.7–4.0)
LYMPHS PCT: 6 % — AB (ref 12–46)
LYMPHS PCT: 13 % (ref 12–46)
Lymphocytes Relative: 18 % (ref 12–46)
Lymphs Abs: 1.6 10*3/uL (ref 0.7–4.0)
Lymphs Abs: 0.6 10*3/uL — ABNORMAL LOW (ref 0.7–4.0)
MCH: 29.4 pg (ref 26.0–34.0)
MCH: 29.4 pg (ref 26.0–34.0)
MCH: 30 pg (ref 26.0–34.0)
MCHC: 32.6 g/dL (ref 30.0–36.0)
MCHC: 32.6 g/dL (ref 30.0–36.0)
MCHC: 33.5 g/dL (ref 30.0–36.0)
MCV: 89.7 fL (ref 78.0–100.0)
MCV: 90.1 fL (ref 78.0–100.0)
MCV: 90.4 fL (ref 78.0–100.0)
MONOS PCT: 8 % (ref 3–12)
MONOS PCT: 1 % — AB (ref 3–12)
Monocytes Absolute: 0.1 10*3/uL (ref 0.1–1.0)
Monocytes Absolute: 0.7 10*3/uL (ref 0.1–1.0)
Monocytes Absolute: 0.9 10*3/uL (ref 0.1–1.0)
Monocytes Relative: 8 % (ref 3–12)
NEUTROS ABS: 6.7 10*3/uL (ref 1.7–7.7)
NEUTROS ABS: 8.2 10*3/uL — AB (ref 1.7–7.7)
NEUTROS PCT: 73 % (ref 43–77)
NEUTROS PCT: 78 % — AB (ref 43–77)
Neutro Abs: 8.6 10*3/uL — ABNORMAL HIGH (ref 1.7–7.7)
Neutrophils Relative %: 93 % — ABNORMAL HIGH (ref 43–77)
PLATELETS: 255 10*3/uL (ref 150–400)
Platelets: 252 10*3/uL (ref 150–400)
Platelets: 225 10*3/uL (ref 150–400)
RBC: 4.36 MIL/uL (ref 4.22–5.81)
RBC: 4.28 MIL/uL (ref 4.22–5.81)
RBC: 4.36 MIL/uL (ref 4.22–5.81)
RDW: 14.8 % (ref 11.5–15.5)
RDW: 14.8 % (ref 11.5–15.5)
RDW: 14.8 % (ref 11.5–15.5)
WBC: 9.2 10*3/uL (ref 4.0–10.5)
WBC: 9.3 10*3/uL (ref 4.0–10.5)
WBC: 10.6 10*3/uL — AB (ref 4.0–10.5)

## 2014-06-27 LAB — MRSA PCR SCREENING: MRSA by PCR: POSITIVE — AB

## 2014-06-27 LAB — PROTIME-INR
INR: 2.7 — AB (ref 0.00–1.49)
INR: 2.53 — AB (ref 0.00–1.49)
INR: 3.28 — AB (ref 0.00–1.49)
PROTHROMBIN TIME: 33.4 s — AB (ref 11.6–15.2)
Prothrombin Time: 28.7 seconds — ABNORMAL HIGH (ref 11.6–15.2)
Prothrombin Time: 27.3 seconds — ABNORMAL HIGH (ref 11.6–15.2)

## 2014-06-27 LAB — BASIC METABOLIC PANEL
ANION GAP: 14 (ref 5–15)
BUN: 34 mg/dL — AB (ref 6–23)
CHLORIDE: 101 meq/L (ref 96–112)
CO2: 25 mEq/L (ref 19–32)
Calcium: 9 mg/dL (ref 8.4–10.5)
Creatinine, Ser: 0.68 mg/dL (ref 0.50–1.35)
GFR calc non Af Amer: 90 mL/min — ABNORMAL LOW (ref >90)
Glucose, Bld: 178 mg/dL — ABNORMAL HIGH (ref 70–99)
POTASSIUM: 4.5 meq/L (ref 3.7–5.3)
Sodium: 140 mEq/L (ref 137–147)

## 2014-06-27 LAB — ABO/RH: ABO/RH(D): O POS

## 2014-06-27 MED ORDER — DOXYCYCLINE HYCLATE 100 MG IV SOLR
100.0000 mg | Freq: Two times a day (BID) | INTRAVENOUS | Status: DC
  Administered 2014-06-27 – 2014-06-28 (×2): 100 mg via INTRAVENOUS
  Filled 2014-06-27 (×3): qty 100

## 2014-06-27 MED ORDER — SODIUM CHLORIDE 0.9 % IV SOLN
1000.0000 mL | Freq: Once | INTRAVENOUS | Status: AC
  Administered 2014-06-27: 1000 mL via INTRAVENOUS

## 2014-06-27 MED ORDER — ALBUTEROL SULFATE (2.5 MG/3ML) 0.083% IN NEBU
2.5000 mg | INHALATION_SOLUTION | Freq: Four times a day (QID) | RESPIRATORY_TRACT | Status: DC | PRN
  Administered 2014-06-27 – 2014-06-28 (×2): 2.5 mg via RESPIRATORY_TRACT
  Filled 2014-06-27 (×2): qty 3

## 2014-06-27 MED ORDER — CEFDINIR 300 MG PO CAPS: 300.0000 mg | ORAL_CAPSULE | Freq: Two times a day (BID) | ORAL | Status: DC

## 2014-06-27 MED ORDER — CLOBETASOL PROPIONATE 0.05 % EX OINT
1.0000 "application " | TOPICAL_OINTMENT | Freq: Two times a day (BID) | CUTANEOUS | Status: DC
  Administered 2014-06-27 – 2014-06-30 (×7): 1 via TOPICAL
  Filled 2014-06-27: qty 15

## 2014-06-27 MED ORDER — AZITHROMYCIN 250 MG PO TABS: 250.0000 mg | ORAL_TABLET | Freq: Every day | ORAL | Status: DC

## 2014-06-27 MED ORDER — CHLORHEXIDINE GLUCONATE CLOTH 2 % EX PADS
6.0000 | MEDICATED_PAD | Freq: Every day | CUTANEOUS | Status: DC
  Administered 2014-06-28 – 2014-06-30 (×3): 6 via TOPICAL

## 2014-06-27 MED ORDER — AZITHROMYCIN 250 MG PO TABS: 500.0000 mg | ORAL_TABLET | Freq: Once | ORAL | Status: DC

## 2014-06-27 MED ORDER — DILTIAZEM HCL ER COATED BEADS 240 MG PO CP24
240.0000 mg | ORAL_CAPSULE | Freq: Every day | ORAL | Status: DC
  Administered 2014-06-27 – 2014-06-30 (×4): 240 mg via ORAL
  Filled 2014-06-27 (×4): qty 1

## 2014-06-27 MED ORDER — SODIUM CHLORIDE 0.9 % IV SOLN
INTRAVENOUS | Status: DC
  Administered 2014-06-27 – 2014-06-30 (×5): via INTRAVENOUS

## 2014-06-27 MED ORDER — METHYLPREDNISOLONE SODIUM SUCC 125 MG IJ SOLR
125.0000 mg | Freq: Once | INTRAMUSCULAR | Status: AC
  Administered 2014-06-27: 125 mg via INTRAVENOUS
  Filled 2014-06-27: qty 2

## 2014-06-27 MED ORDER — PANTOPRAZOLE SODIUM 40 MG PO TBEC
40.0000 mg | DELAYED_RELEASE_TABLET | Freq: Every day | ORAL | Status: DC
  Administered 2014-06-27 – 2014-06-30 (×5): 40 mg via ORAL
  Filled 2014-06-27 (×5): qty 1

## 2014-06-27 MED ORDER — IPRATROPIUM-ALBUTEROL 0.5-2.5 (3) MG/3ML IN SOLN
3.0000 mL | Freq: Once | RESPIRATORY_TRACT | Status: AC
  Administered 2014-06-27: 3 mL via RESPIRATORY_TRACT
  Filled 2014-06-27: qty 3

## 2014-06-27 MED ORDER — TRAMADOL HCL 50 MG PO TABS
50.0000 mg | ORAL_TABLET | Freq: Two times a day (BID) | ORAL | Status: DC
  Administered 2014-06-27 – 2014-06-30 (×7): 50 mg via ORAL
  Filled 2014-06-27 (×7): qty 1

## 2014-06-27 MED ORDER — OXYBUTYNIN CHLORIDE 5 MG PO TABS
5.0000 mg | ORAL_TABLET | Freq: Every day | ORAL | Status: DC
  Administered 2014-06-27 – 2014-06-30 (×4): 5 mg via ORAL
  Filled 2014-06-27 (×4): qty 1

## 2014-06-27 MED ORDER — MUPIROCIN 2 % EX OINT
1.0000 "application " | TOPICAL_OINTMENT | Freq: Two times a day (BID) | CUTANEOUS | Status: DC
  Administered 2014-06-27 – 2014-06-30 (×6): 1 via NASAL
  Filled 2014-06-27: qty 22

## 2014-06-27 MED ORDER — ALBUTEROL SULFATE (2.5 MG/3ML) 0.083% IN NEBU
2.5000 mg | INHALATION_SOLUTION | Freq: Once | RESPIRATORY_TRACT | Status: AC
  Administered 2014-06-27: 2.5 mg via RESPIRATORY_TRACT
  Filled 2014-06-27: qty 3

## 2014-06-27 MED ORDER — FINASTERIDE 5 MG PO TABS
5.0000 mg | ORAL_TABLET | Freq: Every day | ORAL | Status: DC
  Administered 2014-06-27 – 2014-06-30 (×4): 5 mg via ORAL
  Filled 2014-06-27 (×4): qty 1

## 2014-06-27 MED ORDER — CEFTRIAXONE SODIUM 1 G IJ SOLR
1.0000 g | Freq: Once | INTRAMUSCULAR | Status: AC
  Administered 2014-06-27: 1 g via INTRAMUSCULAR
  Filled 2014-06-27: qty 10

## 2014-06-27 MED ORDER — METHYLPREDNISOLONE SODIUM SUCC 125 MG IJ SOLR
60.0000 mg | Freq: Two times a day (BID) | INTRAMUSCULAR | Status: DC
  Administered 2014-06-27 – 2014-06-28 (×3): 60 mg via INTRAVENOUS
  Filled 2014-06-27 (×3): qty 2

## 2014-06-27 MED ORDER — LIDOCAINE HCL (PF) 1 % IJ SOLN
INTRAMUSCULAR | Status: AC
  Administered 2014-06-27: 5 mL
  Filled 2014-06-27: qty 5

## 2014-06-27 MED ORDER — DEXTROSE 5 % IV SOLN
500.0000 mg | Freq: Once | INTRAVENOUS | Status: AC
  Administered 2014-06-27: 500 mg via INTRAVENOUS
  Filled 2014-06-27: qty 500

## 2014-06-27 MED ORDER — IOHEXOL 300 MG/ML  SOLN
80.0000 mL | Freq: Once | INTRAMUSCULAR | Status: AC | PRN
  Administered 2014-06-27: 80 mL via INTRAVENOUS

## 2014-06-27 MED ORDER — DEXTROMETHORPHAN POLISTIREX 30 MG/5ML PO LQCR
60.0000 mg | Freq: Two times a day (BID) | ORAL | Status: DC
  Administered 2014-06-27 – 2014-06-29 (×6): 60 mg via ORAL
  Filled 2014-06-27 (×7): qty 10

## 2014-06-27 MED ORDER — MECLIZINE HCL 12.5 MG PO TABS: 25.0000 mg | ORAL_TABLET | Freq: Two times a day (BID) | ORAL | Status: DC | PRN

## 2014-06-27 MED ORDER — SODIUM CHLORIDE 0.9 % IJ SOLN
3.0000 mL | Freq: Two times a day (BID) | INTRAMUSCULAR | Status: DC
  Administered 2014-06-27 – 2014-06-29 (×5): 3 mL via INTRAVENOUS

## 2014-06-27 MED ORDER — DEXTROSE 5 % IV SOLN: 1.0000 g | Freq: Once | INTRAVENOUS | Status: DC

## 2014-06-27 NOTE — H&P (Signed)
[facility] History and Physical  Caleb HAUBNER Sr. BJS:[id number] DOB: 10-29-1935 DOA: 06/27/2014  Referring physician: ED PCP: Alonza Bogus, MD  Specialists: Pulmonary  Chief Complaint: Hemoptysis  HPI: 79 y/o ?Marland Kitchen Known history of PAF, CHad2Vasc2 score~3 on systemic anticoagulation with Coumadin, previous history of TB treated when he was younger for 3 years with streptomycin/INH, prior admission 02/2008 with hemoptysis non-massive, previous history commonly embolism, BPH, CAD with disease nondominant right coronary artery, massive diverticular bleed 1999-previous polypectomy by Dr. Laural Golden, CHF last EF 04/2011 ? 55-60%, LVH, eczema herpeticum, hyperlipidemia Came to the emergency room 7/11-reported cough x2 days with mild mucous with blood 10 PM 7/12. No sick contacts no fever no chills no nausea no vomiting no chest pain He was apparently only small amounts prior to arrival on initial ED evaluation and found to have community-acquired pneumonia, given Rocephin and prescription for Omnicef/Z-Pak and told to hold Coumadin. Subsequently came back to the emergency room with "chunks of blood in it the bedside"-he's currently holding and vomited it with sputum but also mixed with some bright red blood He has no vomiting of blood but has over the past 3 days seemed dark stools which are greenish black and occasionally coffee ground.    BUN/creatinine 24/0.68 INR 2.7 down from previous lab 3.28 (he has had some high ionized and was told about the Coumadin clinic recently) Chest x-ray = mild airspace disease in the To pulmonary hemorrhage hyperinflated lungs bronchitic changes EKG sinus rhythm PR interval 0 point 08, QRS axis 20, no T wave inversions    Review of Systems: See above discussion In addition complains of dizziness past 2-3 weeks where he feels swimmy headed and staggery. No relationship with movement getting up or sitting down.  Past Medical History  Diagnosis Date  .  Acute exacerbation of chronic bronchitis   . History of tuberculosis   . CAD (coronary artery disease)   . Hyperlipidemia   . BPH (benign prostatic hypertrophy)   . Thyroid disease     hyperthyroidism  . Diabetes mellitus without complication   . GERD (gastroesophageal reflux disease)    Past Surgical History  Procedure Laterality Date  . Cholecystectomy    . 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 Royce Macadamia    Allergies  Allergen Reactions  . Levaquin [Levofloxacin In D5w]     SWELLING    Family History  Problem Relation Age of Onset  . Heart attack Father   . Heart failure Mother     Prior to Admission medications   Medication Sig Start Date End Date Taking? Authorizing Provider  amLODipine (NORVASC) 10 MG tablet Take 5 mg by mouth daily.      Historical Provider, MD  Ascorbic Acid (VITAMIN C) 500 MG tablet Take 500 mg by mouth 2 (two) times daily.     Historical Provider, MD  aspirin 81 MG chewable tablet Chew 81 mg by mouth daily.      Historical Provider, MD  azithromycin (ZITHROMAX Z-PAK) 250 MG tablet Take 1 tablet (250 mg total) by mouth daily. 500mg  PO day 1, then 250mg  PO days 205 06/27/14   Johnna Acosta, MD  calcium carbonate (OS-CAL) 600 MG TABS tablet Take 600 mg by mouth  3 (three) times daily with meals.    Historical Provider, MD  clobetasol cream (TEMOVATE) 3.76 % Apply 1 application topically 2 (two) times daily.  07/21/12   Historical Provider, MD  diclofenac sodium (VOLTAREN) 1 % GEL Apply 2 g topically 4 (four) times daily. 08/03/13   Carole Civil, MD  diltiazem (DILACOR XR) 240 MG 24 hr capsule Take 240 mg by mouth daily.      Historical Provider, MD  finasteride (PROSCAR) 5 MG tablet Take 5 mg by mouth daily.      Historical Provider, MD  fish oil-omega-3  fatty acids 1000 MG capsule Take 2 g by mouth 2 (two) times daily.      Historical Provider, MD  galantamine (RAZADYNE) 8 MG tablet Take 8 mg by mouth 2 (two) times daily.      Historical Provider, MD  glyBURIDE (DIABETA) 5 MG tablet 2 in the morning and 1 tab at night     Historical Provider, MD  guaiFENesin (MUCINEX) 600 MG 12 hr tablet Take 1,200 mg by mouth as needed.      Historical Provider, MD  lisinopril (PRINIVIL,ZESTRIL) 10 MG tablet Take 5 mg by mouth daily.     Historical Provider, MD  loratadine (CLARITIN) 10 MG tablet Take 10 mg by mouth daily.      Historical Provider, MD  magnesium oxide (MAG-OX) 400 MG tablet Take 400 mg by mouth daily.    Historical Provider, MD  meclizine (ANTIVERT) 25 MG tablet  05/18/13   Historical Provider, MD  nitroGLYCERIN (NITROSTAT) 0.4 MG SL tablet Place 0.4 mg under the tongue every 5 (five) minutes as needed.      Historical Provider, MD  omeprazole (PRILOSEC) 20 MG capsule Take 20 mg by mouth 2 (two) times daily.      Historical Provider, MD  oxybutynin (DITROPAN) 5 MG tablet Take 5 mg by mouth daily.      Historical Provider, MD  PRESCRIPTION MEDICATION Inhale 2 puffs into the lungs 2 (two) times daily as needed (wheezing).    Historical Provider, MD  rosuvastatin (CRESTOR) 10 MG tablet Take 5 mg by mouth daily.      Historical Provider, MD  traMADol (ULTRAM) 50 MG tablet Take 50 mg by mouth 2 (two) times daily.      Historical Provider, MD  Vitamin D, Ergocalciferol, (DRISDOL) 50000 UNITS CAPS Take 50,000 Units by mouth every 7 (seven) days.      Historical Provider, MD  warfarin (COUMADIN) 5 MG tablet TAKE 1 TABLET (5 MG TOTAL) BY MOUTH DAILY. 09/09/13   Herminio Commons, MD   Physical Exam: Filed Vitals:   06/27/14 0900 06/27/14 0930 06/27/14 1000 06/27/14 1001  BP: 132/79 126/65 149/74 149/74  Pulse: 80 82 84 89  Temp:      TempSrc:      Resp: 18   18  Height:      Weight:      SpO2: 97% 95% 96% 98%     General:  alert pleasant  oriented no apparent distress currently  Eyes: EOMI NCAT mild pallor   ENT: Soft supple unable to appreciate any blood at present  Neck: No JVD   Cardiovascular: S1-S2 regular rate rhythm   Respiratory: Wheezy throughout no fremitus   Abdomen: Soft nontender nondistended no rebound or please   Skin: No lower extremity edema   Musculoskeletal: Range of motion intact   Psychiatric: Euthymic   Neurologic: Grossly intact  Labs on Admission:  Basic Metabolic Panel:  Recent Labs Lab 06/27/14 0818  NA 140  K 4.5  CL 101  CO2 25  GLUCOSE 178*  BUN 34*  CREATININE 0.68  CALCIUM 9.0   Liver Function Tests: No results found for this basename: AST, ALT, ALKPHOS, BILITOT, PROT, ALBUMIN,  in the last 168 hours No results found for this basename: LIPASE, AMYLASE,  in the last 168 hours No results found for this basename: AMMONIA,  in the last 168 hours CBC:  Recent Labs Lab 06/27/14 0004 06/27/14 0818  WBC 10.6* 9.2  NEUTROABS 8.2* 6.7  HGB 12.8* 13.1  HCT 39.3 39.1  MCV 90.1 89.7  PLT 255 252   Cardiac Enzymes: No results found for this basename: CKTOTAL, CKMB, CKMBINDEX, TROPONINI,  in the last 168 hours  BNP (last 3 results) No results found for this basename: PROBNP,  in the last 8760 hours CBG: No results found for this basename: GLUCAP,  in the last 168 hours  Radiological Exams on Admission: Dg Chest 2 View  06/27/2014   CLINICAL DATA:  Hemoptysis  EXAM: CHEST  2 VIEW  COMPARISON:  06/26/2014  FINDINGS: Normal cardiac silhouette. There is fine airspace disease in the lingula unchanged from prior. There is chronic bronchitic markings. No pneumothorax. Trace pleural effusions. Hyperinflated lungs.  IMPRESSION: Mild airspace disease in the lingula not changed. Differential includes asymmetric edema, pneumonia, and cannot exclude pulmonary hemorrhage in patient with hemoptysis.  Hyperinflated lungs with bronchitic change.   Electronically Signed   By: Suzy Bouchard M.D.   On: 06/27/2014 09:38   Dg Chest 2 View  06/27/2014   CLINICAL DATA:  Bloody sputum for 1 day.  History of TB.  EXAM: CHEST  2 VIEW  COMPARISON:  01/08/2012  FINDINGS: Borderline heart size with normal pulmonary vascularity. Emphysematous changes in the lungs with scattered fibrosis and chronic bronchitic changes. There appears to be superimposed acute nodular infiltration in the left lower lung. This could be due to parenchymal hemorrhage. Reactivated TB or acute pneumonia cannot be excluded. Bilateral central bronchiectasis. Stable appearance of right lateral pleural thickening.  IMPRESSION: Chronic emphysematous and fibrotic changes with old right pleural thickening. Suggestion of superimposed nodular airspace disease in the left lower lung. Bronchiectasis.   Electronically Signed   By: Lucienne Capers M.D.   On: 06/27/2014 00:26    EKG: Independently reviewed. Reviewed  Assessment/Plan Principal Problem:   Hemoptysis, non-massive currently, potentially diffuse pulmonary alveolar hemorrhage-on both Coumadin, and aspirin as well as fish oil  - have discussed with pulmonologist/primary care physician Dr. Luan Pulling who will see patient in consult. CT scan stat  ordered of chest, who refers his INR with [facility] x1. We'll repeat INR subsequently to [facility]   keep on clear liquids until seen by pulmonology Active Problems:   Essential hypertension, benign-as blood pressures are very stable and he is now hypotensive we will reorder diltiazem 240 mg. We will hold off on his amlodipine 10 mg as well as his lisinopril 10 mg for now.   CAD-discontinue aspirin for now. Can continue lisinopril subsequently during this admission   Atrial fibrillation, paroxysmal-patient on Coumadin which has been stopped. Also hold magnesium oxide 4 right   Shortness of breath secondary to potential bronchitis component which may started this -a transitioned from azithromycin 250 daily 2 Levaquin IV for right now and can  transition to tablets subsequently. Hold off on Mucinex to prevent further expectoration.   Of switched him to Solu-Medrol 60 mg every 12 for right now. Can  continue albuterol 2.5 q 6 when necessary   Black tarry stools-potentially related to chief problem of hemoptysis-has had polyps removed by colonoscopy in the past by Dr. Laural Golden   hyperlipidemia-hold Crestor for now    Benign prostatic hypertrophy-continue Proscar 5 mg, oxybutynin 5 daily   Osteoporosis hold calcium carbonate for now, vitamin D   Subacute dizziness-continue meclizine by mouth twice a day when necessary for now    Unlikely GI bleed-monitor stools, Hemoccult. If needed subsequent during admission the asked GI to see him. I think the blood is coming from his coughing being swallowed and processed by his cat  Step down 75 minutes No family present at present  [place] Pager [phone]  If 7PM-7AM, please contact night-coverage [email or web address] Password [facility] 06/27/2014, 10:26 AM

## 2014-06-27 NOTE — Discharge Instructions (Signed)
I talked with the pharmacist regarding the best medicine for you and they suggested a combination of 2 antibiotics  Omnicef - one pill twice daily for 10 days  Z- pak - take for 5 days  Do not take Coumadin today (Sunday)  Your xray showed a small pneumonia - Dr. Luan Pulling can recheck you on Monday but come back to the EF if you are having more blood in your sputum or difficulty breathing or high fevers.

## 2014-06-27 NOTE — Progress Notes (Signed)
PT coughing up old dark brown blood still small amount. Nothing noted as bright red or fresh pink.

## 2014-06-27 NOTE — Consult Note (Signed)
Consult requested by: [facility] Consult requested for hemoptysis:  HPI: This is a 79 year old who has multiple medical problems including coronary artery occlusive disease, COPD which may be related to his previous history of tuberculosis and history of paroxysmal atrial fibrillation on chronic anti-coagulation with Coumadin. He's he started having bloody mucous when he coughed yesterday in the evening. He was thought to have community-acquired pneumonia was given prescriptions and was told to hold Coumadin but he has continued to cough up blood. He came back to the emergency department and he still has blood in it still coughing up sputum. He's going to be admitted and his Coumadin is going to be reversed.  Past Medical History  Diagnosis Date  . Acute exacerbation of chronic bronchitis   . History of tuberculosis   . CAD (coronary artery disease)   . Hyperlipidemia   . BPH (benign prostatic hypertrophy)   . Thyroid disease     hyperthyroidism  . Diabetes mellitus without complication   . GERD (gastroesophageal reflux disease)      Family History  Problem Relation Age of Onset  . Heart attack Father   . Heart failure Mother      History   Social History  . Marital Status: Married    Spouse Name: N/A    Number of Children: N/A  . Years of Education: N/A   Occupational History  . Retired    Social History Main Topics  . Smoking status: Former Smoker -- 0.75 packs/day for 50 years    Types: Cigarettes    Quit date: 10/17/1998  . Smokeless tobacco: Never Used  . Alcohol Use: No  . Drug Use: No  . Sexual Activity: None   Other Topics Concern  . None   Social History Narrative   Married   No regular exercise   Was in the [place] for some years   Used to work in [employer] fund[employer] and subsequently   Has a [place] MD in [place] Dr. Royce Macadamia     ROS: He says he had some mild pleuritic chest pain. He's had some fever. He denies any angina. He's had some dark  stools. No definite chills.    Objective: Vital signs in last 24 hours: Temp:  [97.4 F (36.3 C)-97.6 F (36.4 C)] 97.6 F (36.4 C) (07/12 0754) Pulse Rate:  [71-89] 86 (07/12 1034) Resp:  [11-24] 20 (07/12 1034) BP: (126-149)/(64-87) 126/75 mmHg (07/12 1034) SpO2:  [92 %-98 %] 94 % (07/12 1034) Weight:  [98.431 kg (217 lb)] 98.431 kg (217 lb) (07/12 0754) Weight change:     Intake/Output from previous day:    PHYSICAL EXAM He is awake and alert. He's in no acute distress but he is coughing. He's not coughing up any blood while I am in the room. His pupils are reactive his mucous membranes are moist. His neck is supple without masses. His chest shows rhonchi bilaterally. His heart is regular without gallop his abdomen is soft. Extremities show no edema now. Central nervous system exam shows he is grossly intact  Lab Results: Basic Metabolic Panel:  Recent Labs  06/27/14 0818  NA 140  K 4.5  CL 101  CO2 25  GLUCOSE 178*  BUN 34*  CREATININE 0.68  CALCIUM 9.0   Liver Function Tests: No results found for this basename: AST, ALT, ALKPHOS, BILITOT, PROT, ALBUMIN,  in the last 72 hours No results found for this basename: LIPASE, AMYLASE,  in the last 72 hours No results  found for this basename: AMMONIA,  in the last 72 hours CBC:  Recent Labs  06/27/14 0004 06/27/14 0818  WBC 10.6* 9.2  NEUTROABS 8.2* 6.7  HGB 12.8* 13.1  HCT 39.3 39.1  MCV 90.1 89.7  PLT 255 252   Cardiac Enzymes: No results found for this basename: CKTOTAL, CKMB, CKMBINDEX, TROPONINI,  in the last 72 hours BNP: No results found for this basename: PROBNP,  in the last 72 hours D-Dimer: No results found for this basename: DDIMER,  in the last 72 hours CBG: No results found for this basename: GLUCAP,  in the last 72 hours Hemoglobin A1C: No results found for this basename: HGBA1C,  in the last 72 hours Fasting Lipid Panel: No results found for this basename: CHOL, HDL, LDLCALC, TRIG,  CHOLHDL, LDLDIRECT,  in the last 72 hours Thyroid Function Tests: No results found for this basename: TSH, T4TOTAL, FREET4, T3FREE, THYROIDAB,  in the last 72 hours Anemia Panel: No results found for this basename: VITAMINB12, FOLATE, FERRITIN, TIBC, IRON, RETICCTPCT,  in the last 72 hours Coagulation:  Recent Labs  06/27/14 0004 06/27/14 0818  LABPROT 33.4* 28.7*  INR 3.28* 2.70*   Urine Drug Screen: Drugs of Abuse  No results found for this basename: labopia, cocainscrnur, labbenz, amphetmu, thcu, labbarb    Alcohol Level: No results found for this basename: ETH,  in the last 72 hours Urinalysis: No results found for this basename: COLORURINE, APPERANCEUR, LABSPEC, PHURINE, GLUCOSEU, HGBUR, BILIRUBINUR, KETONESUR, PROTEINUR, UROBILINOGEN, NITRITE, LEUKOCYTESUR,  in the last 72 hours Misc. Labs:   ABGS: No results found for this basename: PHART, PCO2, PO2ART, TCO2, HCO3,  in the last 72 hours   MICROBIOLOGY: No results found for this or any previous visit (from the past 240 hour(s)).  Studies/Results: Dg Chest 2 View  06/27/2014   CLINICAL DATA:  Hemoptysis  EXAM: CHEST  2 VIEW  COMPARISON:  06/26/2014  FINDINGS: Normal cardiac silhouette. There is fine airspace disease in the lingula unchanged from prior. There is chronic bronchitic markings. No pneumothorax. Trace pleural effusions. Hyperinflated lungs.  IMPRESSION: Mild airspace disease in the lingula not changed. Differential includes asymmetric edema, pneumonia, and cannot exclude pulmonary hemorrhage in patient with hemoptysis.  Hyperinflated lungs with bronchitic change.   Electronically Signed   By: Suzy Bouchard M.D.   On: 06/27/2014 09:38   Dg Chest 2 View  06/27/2014   CLINICAL DATA:  Bloody sputum for 1 day.  History of TB.  EXAM: CHEST  2 VIEW  COMPARISON:  01/08/2012  FINDINGS: Borderline heart size with normal pulmonary vascularity. Emphysematous changes in the lungs with scattered fibrosis and chronic  bronchitic changes. There appears to be superimposed acute nodular infiltration in the left lower lung. This could be due to parenchymal hemorrhage. Reactivated TB or acute pneumonia cannot be excluded. Bilateral central bronchiectasis. Stable appearance of right lateral pleural thickening.  IMPRESSION: Chronic emphysematous and fibrotic changes with old right pleural thickening. Suggestion of superimposed nodular airspace disease in the left lower lung. Bronchiectasis.   Electronically Signed   By: Lucienne Capers M.D.   On: 06/27/2014 00:26    Medications:  Prior to Admission:  (Not in a hospital admission) Scheduled: . methylPREDNISolone (SOLU-MEDROL) injection  60 mg Intravenous Q12H   Continuous:  [id number]  Assesment: He has hemoptysis and I agree it is not massive. This is probably related to acute infection plus his chronic Coumadin therapy. He has multiple other medical problems as noted below. I agree with reversing his  Coumadin and I expect that will essentially stop his hemoptysis. I would continue IV antibiotics and put him on IV steroids. Principal Problem:   Hemoptysis, non-massive currently Active Problems:   Essential hypertension, benign   CAD   Atrial fibrillation   Shortness of breath   Black tarry stools-potentially related to chief problem of hemoptysis-has had polyps removed by colonoscopy in the past by Dr. Laural Golden   Cough with hemoptysis    Plan: As above. Thanks for allowing me to see him with you    LOS: 0 days   Aquila Delaughter L 06/27/2014, 11:09 AM

## 2014-06-27 NOTE — ED Provider Notes (Signed)
CSN: [id number]     Arrival date & time 06/27/14  0740 History  This chart was scribed for Nat Christen, MD by Vernell Barrier, ED scribe. This patient was seen in room APA04/[facility]04 and the patient's care was started at 8:12 AM.   Chief Complaint  Patient presents with  . Hemoptysis  . Pneumonia   The history is provided by the patient. No language interpreter was used.   HPI Comments: Caleb MECCA Sr. is a 79 y.o. male w/ hx of TB presents to the Emergency Department complaining of gradually worsening hemoptysis; onset 10 hours ago. Currently asymptomatic; was bringing up "chunks" prior to arrival. Associated left lateral abdominal pain. Baseline SOB. Seen here approximately 9 hours ago for same sxs. Asymptomatic upon time of that arrival. CXR performed; diagnosed with community acquired pneumonia. Withheld one dose of Coumadin due to supratheraputic INR. Treated with Rocephin and given prescriptions for Omnicef and Z-pack. Advised to f/u with PCP tomorrow or come back if sxs persisted. Pt arrived with styrofoam cup containing bloody sputum. Quit smoking in 1999.   Past Medical History  Diagnosis Date  . Acute exacerbation of chronic bronchitis   . History of tuberculosis   . CAD (coronary artery disease)   . Hyperlipidemia   . BPH (benign prostatic hypertrophy)   . Thyroid disease     hyperthyroidism  . Diabetes mellitus without complication   . GERD (gastroesophageal reflux disease)    Past Surgical History  Procedure Laterality Date  . Cholecystectomy    . Thyroidectomy     Family History  Problem Relation Age of Onset  . Heart attack Father    History  Substance Use Topics  . Smoking status: Former Smoker -- 0.75 packs/day for 50 years    Types: Cigarettes    Quit date: 10/17/1998  . Smokeless tobacco: Never Used  . Alcohol Use: No    Review of Systems  Respiratory: Positive for cough and shortness of breath.   Gastrointestinal: Positive for abdominal pain.    A  complete 10 system review of systems was obtained and all systems are negative except as noted in the HPI and PMH.   Allergies  Levaquin  Home Medications   Prior to Admission medications   Medication Sig Start Date End Date Taking? Authorizing Provider  amLODipine (NORVASC) 10 MG tablet Take 5 mg by mouth daily.      Historical Provider, MD  Ascorbic Acid (VITAMIN C) 500 MG tablet Take 500 mg by mouth 2 (two) times daily.     Historical Provider, MD  aspirin 81 MG chewable tablet Chew 81 mg by mouth daily.      Historical Provider, MD  azithromycin (ZITHROMAX Z-PAK) 250 MG tablet Take 1 tablet (250 mg total) by mouth daily. 500mg  PO day 1, then 250mg  PO days 205 06/27/14   Johnna Acosta, MD  calcium carbonate (OS-CAL) 600 MG TABS tablet Take 600 mg by mouth 3 (three) times daily with meals.    Historical Provider, MD  cefdinir (OMNICEF) 300 MG capsule Take 1 capsule (300 mg total) by mouth 2 (two) times daily. 06/27/14   Johnna Acosta, MD  clobetasol cream (TEMOVATE) 7.32 % Apply 1 application topically 2 (two) times daily.  07/21/12   Historical Provider, MD  diclofenac sodium (VOLTAREN) 1 % GEL Apply 2 g topically 4 (four) times daily. 08/03/13   Carole Civil, MD  diltiazem (DILACOR XR) 240 MG 24 hr capsule Take 240 mg by mouth daily.  Historical Provider, MD  finasteride (PROSCAR) 5 MG tablet Take 5 mg by mouth daily.      Historical Provider, MD  fish oil-omega-3 fatty acids 1000 MG capsule Take 2 g by mouth 2 (two) times daily.      Historical Provider, MD  galantamine (RAZADYNE) 8 MG tablet Take 8 mg by mouth 2 (two) times daily.      Historical Provider, MD  glyBURIDE (DIABETA) 5 MG tablet 2 in the morning and 1 tab at night     Historical Provider, MD  guaiFENesin (MUCINEX) 600 MG 12 hr tablet Take 1,200 mg by mouth as needed.      Historical Provider, MD  lisinopril (PRINIVIL,ZESTRIL) 10 MG tablet Take 5 mg by mouth daily.     Historical Provider, MD  loratadine (CLARITIN) 10  MG tablet Take 10 mg by mouth daily.      Historical Provider, MD  magnesium oxide (MAG-OX) 400 MG tablet Take 400 mg by mouth daily.    Historical Provider, MD  meclizine (ANTIVERT) 25 MG tablet  05/18/13   Historical Provider, MD  nitroGLYCERIN (NITROSTAT) 0.4 MG SL tablet Place 0.4 mg under the tongue every 5 (five) minutes as needed.      Historical Provider, MD  omeprazole (PRILOSEC) 20 MG capsule Take 20 mg by mouth 2 (two) times daily.      Historical Provider, MD  oxybutynin (DITROPAN) 5 MG tablet Take 5 mg by mouth daily.      Historical Provider, MD  PRESCRIPTION MEDICATION Inhale 2 puffs into the lungs 2 (two) times daily as needed (wheezing).    Historical Provider, MD  rosuvastatin (CRESTOR) 10 MG tablet Take 5 mg by mouth daily.      Historical Provider, MD  traMADol (ULTRAM) 50 MG tablet Take 50 mg by mouth 2 (two) times daily.      Historical Provider, MD  Vitamin D, Ergocalciferol, (DRISDOL) 50000 UNITS CAPS Take 50,000 Units by mouth every 7 (seven) days.      Historical Provider, MD  warfarin (COUMADIN) 5 MG tablet TAKE 1 TABLET (5 MG TOTAL) BY MOUTH DAILY. 09/09/13   Herminio Commons, MD   Triage vitals: BP 132/87  Pulse 74  Temp(Src) 97.6 F (36.4 C) (Oral)  Resp 18  SpO2 95%  Physical Exam  Nursing note and vitals reviewed. Constitutional: He is oriented to person, place, and time. He appears well-developed and well-nourished.  HENT:  Head: Normocephalic and atraumatic.  Eyes: Conjunctivae and EOM are normal. Pupils are equal, round, and reactive to light.  Neck: Normal range of motion. Neck supple.  Cardiovascular: Normal rate, regular rhythm and normal heart sounds.   Pulmonary/Chest: Effort normal and breath sounds normal.  Coughing but no obvious hemoptysis.   Abdominal: Soft. Bowel sounds are normal.  Musculoskeletal: Normal range of motion.  Neurological: He is alert and oriented to person, place, and time.  Skin: Skin is warm and dry.  Psychiatric: He has  a normal mood and affect. His behavior is normal.    ED Course  Procedures (including critical care time) DIAGNOSTIC STUDIES: Oxygen Saturation is 95% on room air, normal by my interpretation.    COORDINATION OF CARE: At 8:14 AM: Discussed treatment plan with patient which includes repeat CXR, sterioids, and breathing treatment. May consider admit due to 2 visits in the last 12 hours. Patient agrees.   Labs Review Labs Reviewed  BASIC METABOLIC PANEL - Abnormal; Notable for the following:    Glucose, Bld 178 (*)  BUN 34 (*)    GFR calc non Af Amer 90 (*)    All other components within normal limits  PROTIME-INR - Abnormal; Notable for the following:    Prothrombin Time 28.7 (*)    INR 2.70 (*)    All other components within normal limits  CBC WITH DIFFERENTIAL    Imaging Review Dg Chest 2 View  06/27/2014   CLINICAL DATA:  Hemoptysis  EXAM: CHEST  2 VIEW  COMPARISON:  06/26/2014  FINDINGS: Normal cardiac silhouette. There is fine airspace disease in the lingula unchanged from prior. There is chronic bronchitic markings. No pneumothorax. Trace pleural effusions. Hyperinflated lungs.  IMPRESSION: Mild airspace disease in the lingula not changed. Differential includes asymmetric edema, pneumonia, and cannot exclude pulmonary hemorrhage in patient with hemoptysis.  Hyperinflated lungs with bronchitic change.   Electronically Signed   By: Suzy Bouchard M.D.   On: 06/27/2014 09:38   Dg Chest 2 View  06/27/2014   CLINICAL DATA:  Bloody sputum for 1 day.  History of TB.  EXAM: CHEST  2 VIEW  COMPARISON:  01/08/2012  FINDINGS: Borderline heart size with normal pulmonary vascularity. Emphysematous changes in the lungs with scattered fibrosis and chronic bronchitic changes. There appears to be superimposed acute nodular infiltration in the left lower lung. This could be due to parenchymal hemorrhage. Reactivated TB or acute pneumonia cannot be excluded. Bilateral central bronchiectasis.  Stable appearance of right lateral pleural thickening.  IMPRESSION: Chronic emphysematous and fibrotic changes with old right pleural thickening. Suggestion of superimposed nodular airspace disease in the left lower lung. Bronchiectasis.   Electronically Signed   By: Lucienne Capers M.D.   On: 06/27/2014 00:26     EKG Interpretation   Date/Time:  Sunday June 27 2014 07:51:37 EDT Ventricular Rate:  74 PR Interval:  167 QRS Duration: 94 QT Interval:  401 QTC Calculation: 445 R Axis:   28 Text Interpretation:  Sinus rhythm Abnormal R-wave progression, early  transition Inferior infarct, old Confirmed by Landrey Mahurin  MD, [place] ([id number]) on  06/27/2014 8:22:03 AM      MDM   Final diagnoses:  Community acquired pneumonia   Second visit to the emergency department in the past 10 hours for hemoptysis.  Repeat chest x-ray shows airspace disease in the left lower lung. IV steroids, Albuterol/Atrovent nebulizer treatment, IV Zithromax. Patient given IV Rocephin last night. Will obtain CT of chest. Discussed with Dr. Verlon Au   I personally performed the services described in this documentation, which was scribed in my presence. The recorded information has been reviewed and is accurate.     Nat Christen, MD 06/27/14 1002

## 2014-06-27 NOTE — ED Notes (Signed)
Pt now reporting dark, tarry stool which he states began yesterday.

## 2014-06-27 NOTE — ED Notes (Signed)
Patient c/o hemoptysis. Per patient started last night and was seen here in ER. Patient diagnosed with hemoptysis, Supratherapeutic INR, and community acquired pneumonia. Patient told to come back if he continued to have blood in sputum. Patient has styrofoam cup with bloody sputum in hand.

## 2014-06-27 NOTE — ED Notes (Signed)
Dr. Verlon Au discussed with pt about receiving fresh frozen plasma. Pt agreed to procedure and signed consent. Pt also informed he will be admitted on stepdown floor.

## 2014-06-28 LAB — GLUCOSE, CAPILLARY
Glucose-Capillary: 222 mg/dL — ABNORMAL HIGH (ref 70–99)
Glucose-Capillary: 246 mg/dL — ABNORMAL HIGH (ref 70–99)

## 2014-06-28 LAB — COMPREHENSIVE METABOLIC PANEL
ALK PHOS: 72 U/L (ref 39–117)
ALT: 21 U/L (ref 0–53)
AST: 14 U/L (ref 0–37)
Albumin: 3.4 g/dL — ABNORMAL LOW (ref 3.5–5.2)
Anion gap: 16 — ABNORMAL HIGH (ref 5–15)
BUN: 24 mg/dL — AB (ref 6–23)
CALCIUM: 8.8 mg/dL (ref 8.4–10.5)
CO2: 22 mEq/L (ref 19–32)
Chloride: 101 mEq/L (ref 96–112)
Creatinine, Ser: 0.6 mg/dL (ref 0.50–1.35)
GFR calc non Af Amer: >90 mL/min (ref >90)
Glucose, Bld: 260 mg/dL — ABNORMAL HIGH (ref 70–99)
POTASSIUM: 4.8 meq/L (ref 3.7–5.3)
Sodium: 139 mEq/L (ref 137–147)
TOTAL PROTEIN: 6.3 g/dL (ref 6.0–8.3)
Total Bilirubin: 0.3 mg/dL (ref 0.3–1.2)

## 2014-06-28 LAB — PROTIME-INR
INR: 2.07 — AB (ref 0.00–1.49)
INR: 2.39 — AB (ref 0.00–1.49)
PROTHROMBIN TIME: 26.1 s — AB (ref 11.6–15.2)
Prothrombin Time: 23.3 seconds — ABNORMAL HIGH (ref 11.6–15.2)

## 2014-06-28 LAB — CBC
HCT: 36.8 % — ABNORMAL LOW (ref 39.0–52.0)
HEMOGLOBIN: 12.3 g/dL — AB (ref 13.0–17.0)
MCH: 30 pg (ref 26.0–34.0)
MCHC: 33.4 g/dL (ref 30.0–36.0)
MCV: 89.8 fL (ref 78.0–100.0)
PLATELETS: 208 10*3/uL (ref 150–400)
RBC: 4.1 MIL/uL — ABNORMAL LOW (ref 4.22–5.81)
RDW: 14.7 % (ref 11.5–15.5)
WBC: 8.4 10*3/uL (ref 4.0–10.5)

## 2014-06-28 LAB — PREPARE FRESH FROZEN PLASMA
UNIT DIVISION: 0
Unit division: 0

## 2014-06-28 MED ORDER — MAGNESIUM OXIDE 400 (241.3 MG) MG PO TABS
400.0000 mg | ORAL_TABLET | Freq: Two times a day (BID) | ORAL | Status: DC
  Administered 2014-06-28 – 2014-06-30 (×5): 400 mg via ORAL
  Filled 2014-06-28 (×5): qty 1

## 2014-06-28 MED ORDER — AMLODIPINE BESYLATE 5 MG PO TABS
10.0000 mg | ORAL_TABLET | Freq: Every day | ORAL | Status: DC
  Administered 2014-06-28 – 2014-06-30 (×3): 10 mg via ORAL
  Filled 2014-06-28 (×5): qty 2

## 2014-06-28 MED ORDER — FOLIC ACID 1 MG PO TABS
1.0000 mg | ORAL_TABLET | Freq: Every day | ORAL | Status: DC
  Administered 2014-06-28 – 2014-06-30 (×3): 1 mg via ORAL
  Filled 2014-06-28 (×3): qty 1

## 2014-06-28 MED ORDER — INSULIN ASPART 100 UNIT/ML ~~LOC~~ SOLN
0.0000 [IU] | Freq: Three times a day (TID) | SUBCUTANEOUS | Status: DC
  Administered 2014-06-28: 5 [IU] via SUBCUTANEOUS

## 2014-06-28 MED ORDER — DOCUSATE SODIUM 100 MG PO CAPS
300.0000 mg | ORAL_CAPSULE | Freq: Two times a day (BID) | ORAL | Status: DC
  Administered 2014-06-28 – 2014-06-30 (×4): 300 mg via ORAL
  Filled 2014-06-28 (×5): qty 3

## 2014-06-28 MED ORDER — LISINOPRIL 10 MG PO TABS
10.0000 mg | ORAL_TABLET | Freq: Every day | ORAL | Status: DC
  Administered 2014-06-28 – 2014-06-30 (×3): 10 mg via ORAL
  Filled 2014-06-28 (×3): qty 1

## 2014-06-28 MED ORDER — DEXTROSE 5 % IV SOLN
1.0000 g | INTRAVENOUS | Status: DC
  Administered 2014-06-28 – 2014-06-29 (×2): 1 g via INTRAVENOUS
  Filled 2014-06-28 (×3): qty 10

## 2014-06-28 MED ORDER — DEXTROSE 5 % IV SOLN
500.0000 mg | INTRAVENOUS | Status: DC
  Administered 2014-06-28 – 2014-06-29 (×2): 500 mg via INTRAVENOUS
  Filled 2014-06-28 (×2): qty 500

## 2014-06-28 MED ORDER — HYDROCODONE-HOMATROPINE 5-1.5 MG/5ML PO SYRP
5.0000 mL | ORAL_SOLUTION | Freq: Four times a day (QID) | ORAL | Status: DC | PRN
  Administered 2014-06-28 – 2014-06-30 (×4): 5 mL via ORAL
  Filled 2014-06-28 (×5): qty 5

## 2014-06-28 MED ORDER — ALLOPURINOL 300 MG PO TABS
300.0000 mg | ORAL_TABLET | Freq: Every day | ORAL | Status: DC
  Administered 2014-06-28 – 2014-06-30 (×3): 300 mg via ORAL
  Filled 2014-06-28 (×3): qty 1

## 2014-06-28 MED ORDER — GALANTAMINE HYDROBROMIDE 4 MG PO TABS
8.0000 mg | ORAL_TABLET | Freq: Two times a day (BID) | ORAL | Status: DC
  Administered 2014-06-28 – 2014-06-30 (×5): 8 mg via ORAL
  Filled 2014-06-28 (×8): qty 2

## 2014-06-28 MED ORDER — VITAMIN D 1000 UNITS PO TABS
4000.0000 [IU] | ORAL_TABLET | Freq: Every day | ORAL | Status: DC
  Administered 2014-06-28 – 2014-06-30 (×3): 4000 [IU] via ORAL
  Filled 2014-06-28 (×3): qty 4

## 2014-06-28 MED ORDER — ATORVASTATIN CALCIUM 20 MG PO TABS
20.0000 mg | ORAL_TABLET | Freq: Every day | ORAL | Status: DC
  Administered 2014-06-28 – 2014-06-29 (×2): 20 mg via ORAL
  Filled 2014-06-28 (×2): qty 1

## 2014-06-28 NOTE — Progress Notes (Signed)
UR chart review completed.  

## 2014-06-28 NOTE — Progress Notes (Signed)
Subjective: He says he feels better. He is still coughing up some dark blood mixed with purulent sputum. He has no other new complaints. He is short of breath. He denies chest pain.  Objective: Vital signs in last 24 hours: Temp:  [97.4 F (36.3 C)-98.3 F (36.8 C)] 98.1 F (36.7 C) (07/13 0400) Pulse Rate:  [36-92] 66 (07/13 0500) Resp:  [11-29] 26 (07/13 0500) BP: (103-149)/(55-97) 131/61 mmHg (07/13 0500) SpO2:  [90 %-98 %] 97 % (07/13 0500) Weight:  [95.2 kg (209 lb 14.1 oz)-98.431 kg (217 lb)] 95.2 kg (209 lb 14.1 oz) (07/13 0500) Weight change:  Last BM Date: 06/27/14  Intake/Output from previous day: 07/12 0701 - 07/13 0700 In: 2071.3 [P.O.:240; I.V.:1106.3; Blood:225; IV [id number]:782] Out: 9562 [Urine:5150]  PHYSICAL EXAM General appearance: alert, cooperative and no distress Resp: rhonchi bilaterally Cardio: regular rate and rhythm, S1, S2 normal, no murmur, click, rub or gallop GI: soft, non-tender; bowel sounds normal; no masses,  no organomegaly Extremities: extremities normal, atraumatic, no cyanosis or edema  Lab Results:  Results for orders placed during the hospital encounter of 06/27/14 (from the past 48 hour(s))  CBC WITH DIFFERENTIAL     Status: None   Collection Time    06/27/14  8:18 AM      Result Value Ref Range   WBC 9.2  4.0 - 10.5 K/uL   RBC 4.36  4.22 - 5.81 MIL/uL   Hemoglobin 13.1  13.0 - 17.0 g/dL   HCT 39.1  39.0 - 52.0 %   MCV 89.7  78.0 - 100.0 fL   MCH 30.0  26.0 - 34.0 pg   MCHC 33.5  30.0 - 36.0 g/dL   RDW 14.8  11.5 - 15.5 %   Platelets 252  150 - 400 K/uL   Neutrophils Relative % 73  43 - 77 %   Neutro Abs 6.7  1.7 - 7.7 K/uL   Lymphocytes Relative 18  12 - 46 %   Lymphs Abs 1.6  0.7 - 4.0 K/uL   Monocytes Relative 8  3 - 12 %   Monocytes Absolute 0.7  0.1 - 1.0 K/uL   Eosinophils Relative 1  0 - 5 %   Eosinophils Absolute 0.1  0.0 - 0.7 K/uL   Basophils Relative 0  0 - 1 %   Basophils Absolute 0.0  0.0 - 0.1 K/uL  BASIC  METABOLIC PANEL     Status: Abnormal   Collection Time    06/27/14  8:18 AM      Result Value Ref Range   Sodium 140  137 - 147 mEq/L   Potassium 4.5  3.7 - 5.3 mEq/L   Chloride 101  96 - 112 mEq/L   CO2 25  19 - 32 mEq/L   Glucose, Bld 178 (*) 70 - 99 mg/dL   BUN 34 (*) 6 - 23 mg/dL   Creatinine, Ser 0.68  0.50 - 1.35 mg/dL   Calcium 9.0  8.4 - 10.5 mg/dL   GFR calc non Af Amer 90 (*) >90 mL/min   GFR calc Af Amer >90  >90 mL/min   Comment: (NOTE)     The eGFR has been calculated using the CKD EPI equation.     This calculation has not been validated in all clinical situations.     eGFR's persistently <90 mL/min signify possible Chronic Kidney     Disease.   Anion gap 14  5 - 15  PROTIME-INR     Status: Abnormal  Collection Time    06/27/14  8:18 AM      Result Value Ref Range   Prothrombin Time 28.7 (*) 11.6 - 15.2 seconds   INR 2.70 (*) 0.00 - 1.49  PREPARE FRESH FROZEN PLASMA     Status: None   Collection Time    06/27/14 10:25 AM      Result Value Ref Range   Unit Number [id number]     Blood Component Type THW PLS APHR     Unit division 00     Status of Unit ISSUED     Transfusion Status OK TO TRANSFUSE     Unit Number [id number]     Blood Component Type THW PLS APHR     Unit division 00     Status of Unit ISSUED     Transfusion Status OK TO TRANSFUSE    ABO/RH     Status: None   Collection Time    06/27/14 10:25 AM      Result Value Ref Range   ABO/RH(D) O POS    MRSA PCR SCREENING     Status: Abnormal   Collection Time    06/27/14 12:07 PM      Result Value Ref Range   MRSA by PCR POSITIVE (*) NEGATIVE   Comment: RESULT CALLED TO, READ BACK BY AND VERIFIED WITH:     STONE A. AT 1443 ON [id number] BY THOMPSON S.                The GeneXpert MRSA Assay (FDA     approved for NASAL specimens     only), is one component of a     comprehensive MRSA colonization     surveillance program. It is not     intended to diagnose MRSA     infection nor to guide  or     monitor treatment for     MRSA infections.  PROTIME-INR     Status: Abnormal   Collection Time    06/27/14  2:03 PM      Result Value Ref Range   Prothrombin Time 27.3 (*) 11.6 - 15.2 seconds   INR 2.53 (*) 0.00 - 1.49  CBC WITH DIFFERENTIAL     Status: Abnormal   Collection Time    06/27/14  2:03 PM      Result Value Ref Range   WBC 9.3  4.0 - 10.5 K/uL   RBC 4.28  4.22 - 5.81 MIL/uL   Hemoglobin 12.6 (*) 13.0 - 17.0 g/dL   HCT 38.7 (*) 39.0 - 52.0 %   MCV 90.4  78.0 - 100.0 fL   MCH 29.4  26.0 - 34.0 pg   MCHC 32.6  30.0 - 36.0 g/dL   RDW 14.8  11.5 - 15.5 %   Platelets 225  150 - 400 K/uL   Neutrophils Relative % 93 (*) 43 - 77 %   Neutro Abs 8.6 (*) 1.7 - 7.7 K/uL   Lymphocytes Relative 6 (*) 12 - 46 %   Lymphs Abs 0.6 (*) 0.7 - 4.0 K/uL   Monocytes Relative 1 (*) 3 - 12 %   Monocytes Absolute 0.1  0.1 - 1.0 K/uL   Eosinophils Relative 0  0 - 5 %   Eosinophils Absolute 0.0  0.0 - 0.7 K/uL   Basophils Relative 0  0 - 1 %   Basophils Absolute 0.0  0.0 - 0.1 K/uL  PROTIME-INR     Status: Abnormal   Collection Time  06/28/14 12:13 AM      Result Value Ref Range   Prothrombin Time 26.1 (*) 11.6 - 15.2 seconds   INR 2.39 (*) 0.00 - 1.49  COMPREHENSIVE METABOLIC PANEL     Status: Abnormal   Collection Time    06/28/14  4:53 AM      Result Value Ref Range   Sodium 139  137 - 147 mEq/L   Potassium 4.8  3.7 - 5.3 mEq/L   Chloride 101  96 - 112 mEq/L   CO2 22  19 - 32 mEq/L   Glucose, Bld 260 (*) 70 - 99 mg/dL   BUN 24 (*) 6 - 23 mg/dL   Creatinine, Ser 0.60  0.50 - 1.35 mg/dL   Calcium 8.8  8.4 - 10.5 mg/dL   Total Protein 6.3  6.0 - 8.3 g/dL   Albumin 3.4 (*) 3.5 - 5.2 g/dL   AST 14  0 - 37 U/L   ALT 21  0 - 53 U/L   Alkaline Phosphatase 72  39 - 117 U/L   Total Bilirubin 0.3  0.3 - 1.2 mg/dL   GFR calc non Af Amer >90  >90 mL/min   GFR calc Af Amer >90  >90 mL/min   Comment: (NOTE)     The eGFR has been calculated using the CKD EPI equation.     This  calculation has not been validated in all clinical situations.     eGFR's persistently <90 mL/min signify possible Chronic Kidney     Disease.   Anion gap 16 (*) 5 - 15  CBC     Status: Abnormal   Collection Time    06/28/14  4:53 AM      Result Value Ref Range   WBC 8.4  4.0 - 10.5 K/uL   RBC 4.10 (*) 4.22 - 5.81 MIL/uL   Hemoglobin 12.3 (*) 13.0 - 17.0 g/dL   HCT 36.8 (*) 39.0 - 52.0 %   MCV 89.8  78.0 - 100.0 fL   MCH 30.0  26.0 - 34.0 pg   MCHC 33.4  30.0 - 36.0 g/dL   RDW 14.7  11.5 - 15.5 %   Platelets 208  150 - 400 K/uL    ABGS No results found for this basename: PHART, PCO2, PO2ART, TCO2, HCO3,  in the last 72 hours CULTURES Recent Results (from the past 240 hour(s))  MRSA PCR SCREENING     Status: Abnormal   Collection Time    06/27/14 12:07 PM      Result Value Ref Range Status   MRSA by PCR POSITIVE (*) NEGATIVE Final   Comment: RESULT CALLED TO, READ BACK BY AND VERIFIED WITH:     STONE A. AT 1443 ON [id number] BY THOMPSON S.                The GeneXpert MRSA Assay (FDA     approved for NASAL specimens     only), is one component of a     comprehensive MRSA colonization     surveillance program. It is not     intended to diagnose MRSA     infection nor to guide or     monitor treatment for     MRSA infections.   Studies/Results: Dg Chest 2 View  06/27/2014   CLINICAL DATA:  Hemoptysis  EXAM: CHEST  2 VIEW  COMPARISON:  06/26/2014  FINDINGS: Normal cardiac silhouette. There is fine airspace disease in the lingula unchanged from prior. There is  chronic bronchitic markings. No pneumothorax. Trace pleural effusions. Hyperinflated lungs.  IMPRESSION: Mild airspace disease in the lingula not changed. Differential includes asymmetric edema, pneumonia, and cannot exclude pulmonary hemorrhage in patient with hemoptysis.  Hyperinflated lungs with bronchitic change.   Electronically Signed   By: Suzy Bouchard M.D.   On: 06/27/2014 09:38   Dg Chest 2 View  06/27/2014    CLINICAL DATA:  Bloody sputum for 1 day.  History of TB.  EXAM: CHEST  2 VIEW  COMPARISON:  01/08/2012  FINDINGS: Borderline heart size with normal pulmonary vascularity. Emphysematous changes in the lungs with scattered fibrosis and chronic bronchitic changes. There appears to be superimposed acute nodular infiltration in the left lower lung. This could be due to parenchymal hemorrhage. Reactivated TB or acute pneumonia cannot be excluded. Bilateral central bronchiectasis. Stable appearance of right lateral pleural thickening.  IMPRESSION: Chronic emphysematous and fibrotic changes with old right pleural thickening. Suggestion of superimposed nodular airspace disease in the left lower lung. Bronchiectasis.   Electronically Signed   By: Lucienne Capers M.D.   On: 06/27/2014 00:26   Ct Chest W Contrast  06/27/2014   CLINICAL DATA:  Hemoptysis ; past history TB, diabetes, coronary artery disease, GERD  EXAM: CT CHEST WITH CONTRAST  TECHNIQUE: Multidetector CT imaging of the chest was performed during intravenous contrast administration. Sagittal and coronal MPR images reconstructed from axial data set.  CONTRAST:  48mL OMNIPAQUE IOHEXOL 300 MG/ML  SOLN IV  COMPARISON:  07/06/2009  FINDINGS: Extensive atherosclerotic calcifications aorta and coronary arteries.  Gas distention of esophagus to the distal esophagus were questionable wall thickening is identified above a small hiatal hernia.  No thoracic adenopathy.  Pneumobilia unchanged, question prior ERCP with sphincterotomy or biliary bypass procedure.  Remaining visualized upper abdomen unremarkable.  Atelectasis at base of RIGHT lower lobe and in lingula.  Scattered peribronchial thickening and scattered subpleural blebs.  Question minimal bronchiectasis in lingula.  Small foci of infiltrate in RIGHT upper and RIGHT lower lobes.  Nonspecific 5 mm RIGHT upper lobe nodule image 23 not definitely seen previously.  Additional area of slight nodularity in the RIGHT  upper lobe 7 mm diameter image 21 unchanged.  No additional infiltrate, pleural effusion, pneumothorax or mass/ nodule.  Old fractures lateral RIGHT fifth and sixth ribs.  Diffuse osseous demineralization.  IMPRESSION: Changes of COPD/bronchitis with minimal bronchiectasis in lingula.  Stable 7 mm RIGHT lung nodule with question new 5 mm RIGHT upper lobe nodule, recommendation be low.  Small focus of infiltrate in RIGHT upper and RIGHT lower lobes.  Extensive atherosclerotic calcification aorta, great vessels and coronary arteries.  Small hiatal hernia with question distal esophageal wall thickening; correlation for symptoms of gastroesophageal reflux recommended and consider follow-up esophagram or upper endoscopy.  If the patient is at high risk for bronchogenic carcinoma, follow-up chest CT at 6-12 months is recommended. If the patient is at low risk for bronchogenic carcinoma, follow-up chest CT at 12 months is recommended. This recommendation follows the consensus statement: Guidelines for Management of Small Pulmonary Nodules Detected on CT Scans: A Statement from the [place] as published in Radiology 2005;237:395-400.   Electronically Signed   By: Lavonia Dana M.D.   On: 06/27/2014 11:39    Medications:  Prior to Admission:  Prescriptions prior to admission  Medication Sig Dispense Refill  . allopurinol (ZYLOPRIM) 300 MG tablet Take 300 mg by mouth daily.      Marland Kitchen amLODipine (NORVASC) 10 MG tablet Take 10 mg  by mouth daily.      . Ascorbic Acid (VITAMIN C) 500 MG tablet Take 500 mg by mouth 2 (two) times daily.       Marland Kitchen aspirin EC 81 MG tablet Take 81 mg by mouth daily.      . cholecalciferol (VITAMIN D) 1000 UNITS tablet Take 4,000 Units by mouth daily.      . clobetasol cream (TEMOVATE) 7.12 % Apply 1 application topically 2 (two) times daily as needed (Dry Skin).       Marland Kitchen diltiazem (DILACOR XR) 240 MG 24 hr capsule Take 240 mg by mouth daily.        Marland Kitchen docusate sodium (COLACE) 100 MG  capsule Take 300 mg by mouth 2 (two) times daily.      . finasteride (PROSCAR) 5 MG tablet Take 5 mg by mouth daily.        . folic acid (FOLVITE) 1 MG tablet Take 1 mg by mouth daily.      Marland Kitchen galantamine (RAZADYNE) 8 MG tablet Take 8 mg by mouth 2 (two) times daily.        Marland Kitchen glyBURIDE (DIABETA) 5 MG tablet Take 10 mg by mouth 2 (two) times daily with a meal. 2 in the morning and 1 tab at night      . guaiFENesin 200 MG tablet Take 400 mg by mouth 2 (two) times daily.      Marland Kitchen lisinopril (PRINIVIL,ZESTRIL) 20 MG tablet Take 10 mg by mouth daily.      Marland Kitchen loratadine (CLARITIN) 10 MG tablet Take 10 mg by mouth daily.        . magnesium oxide (MAG-OX) 400 MG tablet Take 400 mg by mouth 2 (two) times daily.       . Multiple Vitamin (MULTIVITAMIN WITH MINERALS) TABS tablet Take 1 tablet by mouth daily.      Marland Kitchen omeprazole (PRILOSEC) 20 MG capsule Take 20 mg by mouth 2 (two) times daily.        Marland Kitchen oxybutynin (DITROPAN) 5 MG tablet Take 5 mg by mouth 2 (two) times daily.       . rosuvastatin (CRESTOR) 10 MG tablet Take 5 mg by mouth daily.      Marland Kitchen tiotropium (SPIRIVA) 18 MCG inhalation capsule Place 18 mcg into inhaler and inhale as needed.      . warfarin (COUMADIN) 5 MG tablet Take 5 mg by mouth daily.       Scheduled: . Chlorhexidine Gluconate Cloth  6 each Topical Q0600  . clobetasol ointment  1 application Topical BID  . dextromethorphan  60 mg Oral BID  . diltiazem  240 mg Oral Daily  . doxycycline (VIBRAMYCIN) IV  100 mg Intravenous Q12H  . finasteride  5 mg Oral Daily  . methylPREDNISolone (SOLU-MEDROL) injection  60 mg Intravenous Q12H  . mupirocin ointment  1 application Nasal BID  . oxybutynin  5 mg Oral Daily  . pantoprazole  40 mg Oral Daily  . sodium chloride  3 mL Intravenous Q12H  . traMADol  50 mg Oral BID   Continuous: . sodium chloride 75 mL/hr at 06/28/14 0300   WPY:KDXIPJASN, HYDROcodone-homatropine, meclizine  Assesment: He has hemoptysis which seems to be related to COPD  exacerbation and possibly community-acquired pneumonia. He has been chronically anticoagulated for atrial fibrillation and recurrent DVT. He has hypertension which is pretty well controlled. Principal Problem:   Hemoptysis, non-massive currently Active Problems:   Essential hypertension, benign   CAD   Atrial fibrillation  Shortness of breath   Black tarry stools-potentially related to chief problem of hemoptysis-has had polyps removed by colonoscopy in the past by Dr. Laural Golden   Cough with hemoptysis    Plan: I think he's okay to transfer from the ICU now. He has small-volume hemoptysis. This seems to be getting better since his anticoagulation has been reversed    LOS: 1 day   Andora Krull L 06/28/2014, 7:47 AM

## 2014-06-28 NOTE — Care Management Note (Addendum)
    Page 1 of 1   06/30/2014     9:29:34 AM CARE MANAGEMENT NOTE 06/30/2014  Patient:  Caleb [name], Caleb [name]   Account Number:  [id number]  Date Initiated:  06/28/2014  Documentation initiated by:  Theophilus Kinds  Subjective/Objective Assessment:   Pt admitted from home with hemoptysis. Pt lives with his wife and will return home at discharge. Pt is independent with ADL's. Pt is connected with [place] [facility] [place] [facility] with Dr. Royce Macadamia.     Action/Plan:   Left vm for Vaughan Basta, Scientist, research (life sciences) at [facility]. Will continue to follow for any discharge planning needs.   Anticipated DC Date:  07/01/2014   Anticipated DC Plan:  [place]  CM consult      Choice offered to / List presented to:             Status of service:  Completed, signed off Medicare Important Message given?  YES (If response is "NO", the following Medicare IM given date fields will be blank) Date Medicare IM given:  06/30/2014 Medicare IM given by:  Theophilus Kinds Date Additional Medicare IM given:   Additional Medicare IM given by:    Discharge Disposition:  HOME/SELF CARE  Per UR Regulation:    If discussed at Long Length of Stay Meetings, dates discussed:    Comments:  06/30/14 [place], RN BSN CM Pt dishcarged home today. CM called Dr. Royce Macadamia in [place] [facility] for PT/INR check on Friday. Had to leave vm. Will call pt once MD office calls with appt. No other CM needs noted.  06/28/14 [place], RN BSN CM

## 2014-06-29 DIAGNOSIS — J189 Pneumonia, unspecified organism: Secondary | ICD-10-CM | POA: Diagnosis present

## 2014-06-29 DIAGNOSIS — J449 Chronic obstructive pulmonary disease, unspecified: Secondary | ICD-10-CM | POA: Diagnosis present

## 2014-06-29 DIAGNOSIS — E119 Type 2 diabetes mellitus without complications: Secondary | ICD-10-CM | POA: Diagnosis present

## 2014-06-29 LAB — GLUCOSE, CAPILLARY
GLUCOSE-CAPILLARY: 225 mg/dL — AB (ref 70–99)
GLUCOSE-CAPILLARY: 205 mg/dL — AB (ref 70–99)
Glucose-Capillary: 298 mg/dL — ABNORMAL HIGH (ref 70–99)
Glucose-Capillary: 272 mg/dL — ABNORMAL HIGH (ref 70–99)

## 2014-06-29 LAB — PROTIME-INR
INR: 1.68 — AB (ref 0.00–1.49)
INR: 2.06 — ABNORMAL HIGH (ref 0.00–1.49)
PROTHROMBIN TIME: 19.8 s — AB (ref 11.6–15.2)
Prothrombin Time: 23.2 seconds — ABNORMAL HIGH (ref 11.6–15.2)

## 2014-06-29 MED ORDER — WARFARIN SODIUM 5 MG PO TABS
5.0000 mg | ORAL_TABLET | Freq: Once | ORAL | Status: AC
  Administered 2014-06-29: 5 mg via ORAL
  Filled 2014-06-29: qty 1

## 2014-06-29 MED ORDER — INSULIN ASPART 100 UNIT/ML ~~LOC~~ SOLN
0.0000 [IU] | Freq: Every day | SUBCUTANEOUS | Status: DC
  Administered 2014-06-29: 2 [IU] via SUBCUTANEOUS

## 2014-06-29 MED ORDER — AZITHROMYCIN 250 MG PO TABS
500.0000 mg | ORAL_TABLET | Freq: Every day | ORAL | Status: DC
  Administered 2014-06-30: 500 mg via ORAL
  Filled 2014-06-29: qty 2

## 2014-06-29 MED ORDER — INSULIN ASPART 100 UNIT/ML ~~LOC~~ SOLN
0.0000 [IU] | Freq: Three times a day (TID) | SUBCUTANEOUS | Status: DC
  Administered 2014-06-29 (×2): 11 [IU] via SUBCUTANEOUS
  Administered 2014-06-29: 7 [IU] via SUBCUTANEOUS
  Administered 2014-06-30: 3 [IU] via SUBCUTANEOUS

## 2014-06-29 MED ORDER — WARFARIN - PHARMACIST DOSING INPATIENT
Status: DC
  Administered 2014-06-29: 16:00:00

## 2014-06-29 MED ORDER — PREDNISONE 20 MG PO TABS
40.0000 mg | ORAL_TABLET | Freq: Every day | ORAL | Status: DC
  Administered 2014-06-29 – 2014-06-30 (×2): 40 mg via ORAL
  Filled 2014-06-29 (×2): qty 2

## 2014-06-29 NOTE — Progress Notes (Signed)
ANTICOAGULATION CONSULT NOTE - Initial Consult  Pharmacy Consult for Coumadin Indication: atrial fibrillation  Allergies  Allergen Reactions  . Levaquin [Levofloxacin In D5w]     SWELLING   Patient Measurements: Height: 5\' 9"  (175.3 cm) Weight: 209 lb 14.1 oz (95.2 kg) IBW/kg (Calculated) : 70.7  Vital Signs: BP: 135/68 mmHg (07/14 0900) Pulse Rate: 66 (07/14 0800)  Labs:  Recent Labs  06/27/14 0818 06/27/14 1403 06/28/14 0013 06/28/14 0453 06/28/14 1151 06/29/14 0007  HGB 13.1 12.6*  --  12.3*  --   --   HCT 39.1 38.7*  --  36.8*  --   --   PLT 252 225  --  208  --   --   LABPROT 28.7* 27.3* 26.1*  --  23.3* 23.2*  INR 2.70* 2.53* 2.39*  --  2.07* 2.06*  CREATININE 0.68  --   --  0.60  --   --    Estimated Creatinine Clearance: 88 ml/min (by C-G formula based on Cr of 0.6).  Medical History: Past Medical History  Diagnosis Date  . Acute exacerbation of chronic bronchitis   . History of tuberculosis   . CAD (coronary artery disease)   . Hyperlipidemia   . BPH (benign prostatic hypertrophy)   . Thyroid disease     hyperthyroidism  . Diabetes mellitus without complication   . GERD (gastroesophageal reflux disease)   . Diverticulitis    Medications:  Prescriptions prior to admission  Medication Sig Dispense Refill  . allopurinol (ZYLOPRIM) 300 MG tablet Take 300 mg by mouth daily.      Marland Kitchen amLODipine (NORVASC) 10 MG tablet Take 10 mg by mouth daily.      . Ascorbic Acid (VITAMIN C) 500 MG tablet Take 500 mg by mouth 2 (two) times daily.       Marland Kitchen aspirin EC 81 MG tablet Take 81 mg by mouth daily.      . cholecalciferol (VITAMIN D) 1000 UNITS tablet Take 4,000 Units by mouth daily.      . clobetasol cream (TEMOVATE) 2.11 % Apply 1 application topically 2 (two) times daily as needed (Dry Skin).       Marland Kitchen diltiazem (DILACOR XR) 240 MG 24 hr capsule Take 240 mg by mouth daily.        Marland Kitchen docusate sodium (COLACE) 100 MG capsule Take 300 mg by mouth 2 (two) times daily.       . finasteride (PROSCAR) 5 MG tablet Take 5 mg by mouth daily.        . folic acid (FOLVITE) 1 MG tablet Take 1 mg by mouth daily.      Marland Kitchen galantamine (RAZADYNE) 8 MG tablet Take 8 mg by mouth 2 (two) times daily.        Marland Kitchen glyBURIDE (DIABETA) 5 MG tablet Take 10 mg by mouth 2 (two) times daily with a meal. 2 in the morning and 1 tab at night      . guaiFENesin 200 MG tablet Take 400 mg by mouth 2 (two) times daily.      Marland Kitchen lisinopril (PRINIVIL,ZESTRIL) 20 MG tablet Take 10 mg by mouth daily.      Marland Kitchen loratadine (CLARITIN) 10 MG tablet Take 10 mg by mouth daily.        . magnesium oxide (MAG-OX) 400 MG tablet Take 400 mg by mouth 2 (two) times daily.       . Multiple Vitamin (MULTIVITAMIN WITH MINERALS) TABS tablet Take 1 tablet by mouth daily.      Marland Kitchen  omeprazole (PRILOSEC) 20 MG capsule Take 20 mg by mouth 2 (two) times daily.        Marland Kitchen oxybutynin (DITROPAN) 5 MG tablet Take 5 mg by mouth 2 (two) times daily.       . rosuvastatin (CRESTOR) 10 MG tablet Take 5 mg by mouth daily.      Marland Kitchen tiotropium (SPIRIVA) 18 MCG inhalation capsule Place 18 mcg into inhaler and inhale as needed.      . warfarin (COUMADIN) 5 MG tablet Take 5 mg by mouth daily.       Assessment: 79yo male on chronic Coumadin PTA for h/o afib.  Home dose listed above.  Coumadin has been held since admission due to coughing up some blood.  INR remains therapeutic (on low end of range) despite Coumadin being held.  Pt is on zithromax which can interact with Coumadin to increase INR.  Asked to resume Coumadin today.  Pt only coughing up minimal amounts of blood per report.  Goal of Therapy:  INR 2-3 Monitor platelets by anticoagulation protocol: Yes   Plan:  Coumadin 5mg  po today x 1 (home dose) INR daily Monitor closely while on [facility], Florenda Watt A 06/29/2014,11:39 AM

## 2014-06-29 NOTE — Progress Notes (Signed)
Report given to J.Mays,RN. Patient transferred to [id number] by NT via wheelchair.

## 2014-06-29 NOTE — Progress Notes (Signed)
PHARMACIST - PHYSICIAN COMMUNICATION DR:   Luan Pulling CONCERNING: Antibiotic IV to Oral Route Change Policy  RECOMMENDATION: This patient is receiving Zithromax by the intravenous route.  Based on criteria approved by the Pharmacy and Therapeutics Committee, the antibiotic(s) is/are being converted to the equivalent oral dose form(s).   DESCRIPTION: These criteria include:  Patient being treated for a respiratory tract infection, urinary tract infection, cellulitis or clostridium difficile associated diarrhea if on metronidazole  The patient is not neutropenic and does not exhibit a GI malabsorption state  The patient is eating (either orally or via tube) and/or has been taking other orally administered medications for a least 24 hours  The patient is improving clinically and has a Tmax < 100.5  If you have questions about this conversion, please contact the Pharmacy Department  [x]   [phone] )  [facility] []   [phone] )  [facility]  []   [phone] )  [facility] []   [phone] )  [facility]   S. [place] Crane, PharmD

## 2014-06-29 NOTE — Progress Notes (Signed)
Ambulated patient 3 times around unit. Patient ready to get back to chair but had no complaints of pain or SOB during or after ambulation.

## 2014-06-29 NOTE — Progress Notes (Signed)
Subjective: He says he feels better. He's worried about his blood sugar being up. He has no other new complaints. He is still coughing but only coughing up minimal amount of blood  Objective: Vital signs in last 24 hours: Temp:  [98 F (36.7 C)] 98 F (36.7 C) (07/13 2000) Pulse Rate:  [62-80] 62 (07/14 0600) Resp:  [15-30] 21 (07/14 0600) BP: (106-169)/(48-102) 111/52 mmHg (07/14 0600) SpO2:  [90 %-100 %] 90 % (07/14 0600) Weight change:  Last BM Date: 06/27/14  Intake/Output from previous day: 07/13 0701 - 07/14 0700 In: 1650 [I.V.:1350; IV Piggyback:300] Out: 5200 [Urine:5200]  PHYSICAL EXAM General appearance: alert, cooperative and no distress Resp: rhonchi bilaterally Cardio: regular rate and rhythm, S1, S2 normal, no murmur, click, rub or gallop GI: soft, non-tender; bowel sounds normal; no masses,  no organomegaly Extremities: extremities normal, atraumatic, no cyanosis or edema  Lab Results:  Results for orders placed during the hospital encounter of 06/27/14 (from the past 48 hour(s))  CBC WITH DIFFERENTIAL     Status: None   Collection Time    06/27/14  8:18 AM      Result Value Ref Range   WBC 9.2  4.0 - 10.5 K/uL   RBC 4.36  4.22 - 5.81 MIL/uL   Hemoglobin 13.1  13.0 - 17.0 g/dL   HCT 39.1  39.0 - 52.0 %   MCV 89.7  78.0 - 100.0 fL   MCH 30.0  26.0 - 34.0 pg   MCHC 33.5  30.0 - 36.0 g/dL   RDW 14.8  11.5 - 15.5 %   Platelets 252  150 - 400 K/uL   Neutrophils Relative % 73  43 - 77 %   Neutro Abs 6.7  1.7 - 7.7 K/uL   Lymphocytes Relative 18  12 - 46 %   Lymphs Abs 1.6  0.7 - 4.0 K/uL   Monocytes Relative 8  3 - 12 %   Monocytes Absolute 0.7  0.1 - 1.0 K/uL   Eosinophils Relative 1  0 - 5 %   Eosinophils Absolute 0.1  0.0 - 0.7 K/uL   Basophils Relative 0  0 - 1 %   Basophils Absolute 0.0  0.0 - 0.1 K/uL  BASIC METABOLIC PANEL     Status: Abnormal   Collection Time    06/27/14  8:18 AM      Result Value Ref Range   Sodium 140  137 - 147 mEq/L    Potassium 4.5  3.7 - 5.3 mEq/L   Chloride 101  96 - 112 mEq/L   CO2 25  19 - 32 mEq/L   Glucose, Bld 178 (*) 70 - 99 mg/dL   BUN 34 (*) 6 - 23 mg/dL   Creatinine, Ser 0.68  0.50 - 1.35 mg/dL   Calcium 9.0  8.4 - 10.5 mg/dL   GFR calc non Af Amer 90 (*) >90 mL/min   GFR calc Af Amer >90  >90 mL/min   Comment: (NOTE)     The eGFR has been calculated using the CKD EPI equation.     This calculation has not been validated in all clinical situations.     eGFR's persistently <90 mL/min signify possible Chronic Kidney     Disease.   Anion gap 14  5 - 15  PROTIME-INR     Status: Abnormal   Collection Time    06/27/14  8:18 AM      Result Value Ref Range   Prothrombin Time 28.7 (*)  11.6 - 15.2 seconds   INR 2.70 (*) 0.00 - 1.49  PREPARE FRESH FROZEN PLASMA     Status: None   Collection Time    06/27/14 10:25 AM      Result Value Ref Range   Unit Number [id number]     Blood Component Type THW PLS APHR     Unit division 00     Status of Unit ISSUED,FINAL     Transfusion Status OK TO TRANSFUSE     Unit Number [id number]     Blood Component Type THW PLS APHR     Unit division 00     Status of Unit ISSUED,FINAL     Transfusion Status OK TO TRANSFUSE    ABO/RH     Status: None   Collection Time    06/27/14 10:25 AM      Result Value Ref Range   ABO/RH(D) O POS    MRSA PCR SCREENING     Status: Abnormal   Collection Time    06/27/14 12:07 PM      Result Value Ref Range   MRSA by PCR POSITIVE (*) NEGATIVE   Comment: RESULT CALLED TO, READ BACK BY AND VERIFIED WITH:     STONE A. AT 1443 ON [id number] BY THOMPSON S.                The GeneXpert MRSA Assay (FDA     approved for NASAL specimens     only), is one component of a     comprehensive MRSA colonization     surveillance program. It is not     intended to diagnose MRSA     infection nor to guide or     monitor treatment for     MRSA infections.  PROTIME-INR     Status: Abnormal   Collection Time    06/27/14  2:03 PM       Result Value Ref Range   Prothrombin Time 27.3 (*) 11.6 - 15.2 seconds   INR 2.53 (*) 0.00 - 1.49  CBC WITH DIFFERENTIAL     Status: Abnormal   Collection Time    06/27/14  2:03 PM      Result Value Ref Range   WBC 9.3  4.0 - 10.5 K/uL   RBC 4.28  4.22 - 5.81 MIL/uL   Hemoglobin 12.6 (*) 13.0 - 17.0 g/dL   HCT 38.7 (*) 39.0 - 52.0 %   MCV 90.4  78.0 - 100.0 fL   MCH 29.4  26.0 - 34.0 pg   MCHC 32.6  30.0 - 36.0 g/dL   RDW 14.8  11.5 - 15.5 %   Platelets 225  150 - 400 K/uL   Neutrophils Relative % 93 (*) 43 - 77 %   Neutro Abs 8.6 (*) 1.7 - 7.7 K/uL   Lymphocytes Relative 6 (*) 12 - 46 %   Lymphs Abs 0.6 (*) 0.7 - 4.0 K/uL   Monocytes Relative 1 (*) 3 - 12 %   Monocytes Absolute 0.1  0.1 - 1.0 K/uL   Eosinophils Relative 0  0 - 5 %   Eosinophils Absolute 0.0  0.0 - 0.7 K/uL   Basophils Relative 0  0 - 1 %   Basophils Absolute 0.0  0.0 - 0.1 K/uL  PROTIME-INR     Status: Abnormal   Collection Time    06/28/14 12:13 AM      Result Value Ref Range   Prothrombin Time 26.1 (*) 11.6 - 15.2 seconds  INR 2.39 (*) 0.00 - 1.49  COMPREHENSIVE METABOLIC PANEL     Status: Abnormal   Collection Time    06/28/14  4:53 AM      Result Value Ref Range   Sodium 139  137 - 147 mEq/L   Potassium 4.8  3.7 - 5.3 mEq/L   Chloride 101  96 - 112 mEq/L   CO2 22  19 - 32 mEq/L   Glucose, Bld 260 (*) 70 - 99 mg/dL   BUN 24 (*) 6 - 23 mg/dL   Creatinine, Ser 0.60  0.50 - 1.35 mg/dL   Calcium 8.8  8.4 - 10.5 mg/dL   Total Protein 6.3  6.0 - 8.3 g/dL   Albumin 3.4 (*) 3.5 - 5.2 g/dL   AST 14  0 - 37 U/L   ALT 21  0 - 53 U/L   Alkaline Phosphatase 72  39 - 117 U/L   Total Bilirubin 0.3  0.3 - 1.2 mg/dL   GFR calc non Af Amer >90  >90 mL/min   GFR calc Af Amer >90  >90 mL/min   Comment: (NOTE)     The eGFR has been calculated using the CKD EPI equation.     This calculation has not been validated in all clinical situations.     eGFR's persistently <90 mL/min signify possible Chronic Kidney      Disease.   Anion gap 16 (*) 5 - 15  CBC     Status: Abnormal   Collection Time    06/28/14  4:53 AM      Result Value Ref Range   WBC 8.4  4.0 - 10.5 K/uL   RBC 4.10 (*) 4.22 - 5.81 MIL/uL   Hemoglobin 12.3 (*) 13.0 - 17.0 g/dL   HCT 36.8 (*) 39.0 - 52.0 %   MCV 89.8  78.0 - 100.0 fL   MCH 30.0  26.0 - 34.0 pg   MCHC 33.4  30.0 - 36.0 g/dL   RDW 14.7  11.5 - 15.5 %   Platelets 208  150 - 400 K/uL  PROTIME-INR     Status: Abnormal   Collection Time    06/28/14 11:51 AM      Result Value Ref Range   Prothrombin Time 23.3 (*) 11.6 - 15.2 seconds   INR 2.07 (*) 0.00 - 1.49  GLUCOSE, CAPILLARY     Status: Abnormal   Collection Time    06/28/14  4:57 PM      Result Value Ref Range   Glucose-Capillary 222 (*) 70 - 99 mg/dL   Comment 1 Notify RN    GLUCOSE, CAPILLARY     Status: Abnormal   Collection Time    06/28/14  9:27 PM      Result Value Ref Range   Glucose-Capillary 246 (*) 70 - 99 mg/dL  PROTIME-INR     Status: Abnormal   Collection Time    06/29/14 12:07 AM      Result Value Ref Range   Prothrombin Time 23.2 (*) 11.6 - 15.2 seconds   INR 2.06 (*) 0.00 - 1.49    ABGS No results found for this basename: PHART, PCO2, PO2ART, TCO2, HCO3,  in the last 72 hours CULTURES Recent Results (from the past 240 hour(s))  MRSA PCR SCREENING     Status: Abnormal   Collection Time    06/27/14 12:07 PM      Result Value Ref Range Status   MRSA by PCR POSITIVE (*) NEGATIVE Final  Comment: RESULT CALLED TO, READ BACK BY AND VERIFIED WITH:     STONE A. AT 1443 ON [id number] BY THOMPSON S.                The GeneXpert MRSA Assay (FDA     approved for NASAL specimens     only), is one component of a     comprehensive MRSA colonization     surveillance program. It is not     intended to diagnose MRSA     infection nor to guide or     monitor treatment for     MRSA infections.   Studies/Results: Dg Chest 2 View  06/27/2014   CLINICAL DATA:  Hemoptysis  EXAM: CHEST  2 VIEW   COMPARISON:  06/26/2014  FINDINGS: Normal cardiac silhouette. There is fine airspace disease in the lingula unchanged from prior. There is chronic bronchitic markings. No pneumothorax. Trace pleural effusions. Hyperinflated lungs.  IMPRESSION: Mild airspace disease in the lingula not changed. Differential includes asymmetric edema, pneumonia, and cannot exclude pulmonary hemorrhage in patient with hemoptysis.  Hyperinflated lungs with bronchitic change.   Electronically Signed   By: Suzy Bouchard M.D.   On: 06/27/2014 09:38   Ct Chest W Contrast  06/27/2014   CLINICAL DATA:  Hemoptysis ; past history TB, diabetes, coronary artery disease, GERD  EXAM: CT CHEST WITH CONTRAST  TECHNIQUE: Multidetector CT imaging of the chest was performed during intravenous contrast administration. Sagittal and coronal MPR images reconstructed from axial data set.  CONTRAST:  55m OMNIPAQUE IOHEXOL 300 MG/ML  SOLN IV  COMPARISON:  07/06/2009  FINDINGS: Extensive atherosclerotic calcifications aorta and coronary arteries.  Gas distention of esophagus to the distal esophagus were questionable wall thickening is identified above a small hiatal hernia.  No thoracic adenopathy.  Pneumobilia unchanged, question prior ERCP with sphincterotomy or biliary bypass procedure.  Remaining visualized upper abdomen unremarkable.  Atelectasis at base of RIGHT lower lobe and in lingula.  Scattered peribronchial thickening and scattered subpleural blebs.  Question minimal bronchiectasis in lingula.  Small foci of infiltrate in RIGHT upper and RIGHT lower lobes.  Nonspecific 5 mm RIGHT upper lobe nodule image 23 not definitely seen previously.  Additional area of slight nodularity in the RIGHT upper lobe 7 mm diameter image 21 unchanged.  No additional infiltrate, pleural effusion, pneumothorax or mass/ nodule.  Old fractures lateral RIGHT fifth and sixth ribs.  Diffuse osseous demineralization.  IMPRESSION: Changes of COPD/bronchitis with minimal  bronchiectasis in lingula.  Stable 7 mm RIGHT lung nodule with question new 5 mm RIGHT upper lobe nodule, recommendation be low.  Small focus of infiltrate in RIGHT upper and RIGHT lower lobes.  Extensive atherosclerotic calcification aorta, great vessels and coronary arteries.  Small hiatal hernia with question distal esophageal wall thickening; correlation for symptoms of gastroesophageal reflux recommended and consider follow-up esophagram or upper endoscopy.  If the patient is at high risk for bronchogenic carcinoma, follow-up chest CT at 6-12 months is recommended. If the patient is at low risk for bronchogenic carcinoma, follow-up chest CT at 12 months is recommended. This recommendation follows the consensus statement: Guidelines for Management of Small Pulmonary Nodules Detected on CT Scans: A Statement from the [place] published in Radiology 2005;237:395-400.   Electronically Signed   By: MLavonia DanaM.D.   On: 06/27/2014 11:39    Medications:  Prior to Admission:  Prescriptions prior to admission  Medication Sig Dispense Refill  . allopurinol (ZYLOPRIM) 300 MG tablet Take  300 mg by mouth daily.      Marland Kitchen amLODipine (NORVASC) 10 MG tablet Take 10 mg by mouth daily.      . Ascorbic Acid (VITAMIN C) 500 MG tablet Take 500 mg by mouth 2 (two) times daily.       Marland Kitchen aspirin EC 81 MG tablet Take 81 mg by mouth daily.      . cholecalciferol (VITAMIN D) 1000 UNITS tablet Take 4,000 Units by mouth daily.      . clobetasol cream (TEMOVATE) 8.18 % Apply 1 application topically 2 (two) times daily as needed (Dry Skin).       Marland Kitchen diltiazem (DILACOR XR) 240 MG 24 hr capsule Take 240 mg by mouth daily.        Marland Kitchen docusate sodium (COLACE) 100 MG capsule Take 300 mg by mouth 2 (two) times daily.      . finasteride (PROSCAR) 5 MG tablet Take 5 mg by mouth daily.        . folic acid (FOLVITE) 1 MG tablet Take 1 mg by mouth daily.      Marland Kitchen galantamine (RAZADYNE) 8 MG tablet Take 8 mg by mouth 2 (two) times  daily.        Marland Kitchen glyBURIDE (DIABETA) 5 MG tablet Take 10 mg by mouth 2 (two) times daily with a meal. 2 in the morning and 1 tab at night      . guaiFENesin 200 MG tablet Take 400 mg by mouth 2 (two) times daily.      Marland Kitchen lisinopril (PRINIVIL,ZESTRIL) 20 MG tablet Take 10 mg by mouth daily.      Marland Kitchen loratadine (CLARITIN) 10 MG tablet Take 10 mg by mouth daily.        . magnesium oxide (MAG-OX) 400 MG tablet Take 400 mg by mouth 2 (two) times daily.       . Multiple Vitamin (MULTIVITAMIN WITH MINERALS) TABS tablet Take 1 tablet by mouth daily.      Marland Kitchen omeprazole (PRILOSEC) 20 MG capsule Take 20 mg by mouth 2 (two) times daily.        Marland Kitchen oxybutynin (DITROPAN) 5 MG tablet Take 5 mg by mouth 2 (two) times daily.       . rosuvastatin (CRESTOR) 10 MG tablet Take 5 mg by mouth daily.      Marland Kitchen tiotropium (SPIRIVA) 18 MCG inhalation capsule Place 18 mcg into inhaler and inhale as needed.      . warfarin (COUMADIN) 5 MG tablet Take 5 mg by mouth daily.       Scheduled: . allopurinol  300 mg Oral Daily  . amLODipine  10 mg Oral Daily  . atorvastatin  20 mg Oral q1800  . azithromycin  500 mg Intravenous Q24H  . cefTRIAXone (ROCEPHIN)  IV  1 g Intravenous Q24H  . Chlorhexidine Gluconate Cloth  6 each Topical Q0600  . cholecalciferol  4,000 Units Oral Daily  . clobetasol ointment  1 application Topical BID  . dextromethorphan  60 mg Oral BID  . diltiazem  240 mg Oral Daily  . docusate sodium  300 mg Oral BID  . finasteride  5 mg Oral Daily  . folic acid  1 mg Oral Daily  . galantamine  8 mg Oral BID  . insulin aspart  0-20 Units Subcutaneous TID WC  . insulin aspart  0-5 Units Subcutaneous QHS  . lisinopril  10 mg Oral Daily  . magnesium oxide  400 mg Oral BID  . mupirocin ointment  1 application Nasal BID  . oxybutynin  5 mg Oral Daily  . pantoprazole  40 mg Oral Daily  . predniSONE  40 mg Oral Q breakfast  . sodium chloride  3 mL Intravenous Q12H  . traMADol  50 mg Oral BID   Continuous: . sodium  chloride 75 mL/hr at 06/29/14 0309   NIO:[id number], HYDROcodone-homatropine, meclizine  Assesment: He was admitted with community-acquired pneumonia and hemoptysis from that. He has significant amount of bronchiectasis and COPD. He has been chronically anticoagulated because of paroxysmal atrial fibrillation and because of DVT. He is improved. He has diabetes and his blood sugar has been up Principal Problem:   Hemoptysis, non-massive currently Active Problems:   Essential hypertension, benign   CAD   Atrial fibrillation   Shortness of breath   Black tarry stools-potentially related to chief problem of hemoptysis-has had polyps removed by colonoscopy in the past by Dr. Laural Golden   Cough with hemoptysis    Plan: Discontinue IV steroids. Change into resistant sliding scale. He may be ready for discharge tomorrow    LOS: 2 days   Addiel Mccardle L 06/29/2014, 7:49 AM

## 2014-06-30 LAB — PROTIME-INR
INR: 1.75 — ABNORMAL HIGH (ref 0.00–1.49)
Prothrombin Time: 20.4 seconds — ABNORMAL HIGH (ref 11.6–15.2)

## 2014-06-30 LAB — GLUCOSE, CAPILLARY: Glucose-Capillary: 131 mg/dL — ABNORMAL HIGH (ref 70–99)

## 2014-06-30 MED ORDER — CEPHALEXIN 500 MG PO CAPS: 500.0000 mg | ORAL_CAPSULE | Freq: Three times a day (TID) | ORAL | Status: DC

## 2014-06-30 NOTE — Progress Notes (Signed)
IV removed. Discharge instructions reviewed with patient and family member. Understanding verbalized. Ready for discharge home.

## 2014-06-30 NOTE — Discharge Summary (Signed)
Physician Discharge Summary  Patient ID: Caleb SALLADE Sr. MRN: [id number] DOB/AGE: 1935-10-25 79 y.o. Primary Care Physician:Rekia Kujala L, MD Admit date: 06/27/2014 Discharge date: 06/30/2014    Discharge Diagnoses:   Principal Problem:   Hemoptysis, non-massive currently Active Problems:   Essential hypertension, benign   CAD   Atrial fibrillation   Shortness of breath   Black tarry stools-potentially related to chief problem of hemoptysis-has had polyps removed by colonoscopy in the past by Dr. Laural Golden   Cough with hemoptysis   CAP (community acquired pneumonia)   COPD (chronic obstructive pulmonary disease)   Type II or unspecified type diabetes mellitus without mention of complication, not stated as uncontrolled     Medication List         allopurinol 300 MG tablet  Commonly known as:  ZYLOPRIM  Take 300 mg by mouth daily.     amLODipine 10 MG tablet  Commonly known as:  NORVASC  Take 10 mg by mouth daily.     aspirin EC 81 MG tablet  Take 81 mg by mouth daily.     cephALEXin 500 MG capsule  Commonly known as:  KEFLEX  Take 1 capsule (500 mg total) by mouth 3 (three) times daily.     cholecalciferol 1000 UNITS tablet  Commonly known as:  VITAMIN D  Take 4,000 Units by mouth daily.     clobetasol cream 0.05 %  Commonly known as:  TEMOVATE  Apply 1 application topically 2 (two) times daily as needed (Dry Skin).     diltiazem 240 MG 24 hr capsule  Commonly known as:  DILACOR XR  Take 240 mg by mouth daily.     docusate sodium 100 MG capsule  Commonly known as:  COLACE  Take 300 mg by mouth 2 (two) times daily.     finasteride 5 MG tablet  Commonly known as:  PROSCAR  Take 5 mg by mouth daily.     folic acid 1 MG tablet  Commonly known as:  FOLVITE  Take 1 mg by mouth daily.     galantamine 8 MG tablet  Commonly known as:  RAZADYNE  Take 8 mg by mouth 2 (two) times daily.     glyBURIDE 5 MG tablet  Commonly known as:  DIABETA  Take 10 mg  by mouth 2 (two) times daily with a meal. 2 in the morning and 1 tab at night     guaiFENesin 200 MG tablet  Take 400 mg by mouth 2 (two) times daily.     lisinopril 20 MG tablet  Commonly known as:  PRINIVIL,ZESTRIL  Take 10 mg by mouth daily.     loratadine 10 MG tablet  Commonly known as:  CLARITIN  Take 10 mg by mouth daily.     magnesium oxide 400 MG tablet  Commonly known as:  MAG-OX  Take 400 mg by mouth 2 (two) times daily.     multivitamin with minerals Tabs tablet  Take 1 tablet by mouth daily.     omeprazole 20 MG capsule  Commonly known as:  PRILOSEC  Take 20 mg by mouth 2 (two) times daily.     oxybutynin 5 MG tablet  Commonly known as:  DITROPAN  Take 5 mg by mouth 2 (two) times daily.     rosuvastatin 10 MG tablet  Commonly known as:  CRESTOR  Take 5 mg by mouth daily.     tiotropium 18 MCG inhalation capsule  Commonly known as:  SPIRIVA  Place 18 mcg into inhaler and inhale as needed.     vitamin C 500 MG tablet  Commonly known as:  ASCORBIC ACID  Take 500 mg by mouth 2 (two) times daily.     warfarin 5 MG tablet  Commonly known as:  COUMADIN  Take 5 mg by mouth daily.        Discharged Condition: Improved    Consults: Pulmonary  Significant Diagnostic Studies: Dg Chest 2 View  06/27/2014   CLINICAL DATA:  Hemoptysis  EXAM: CHEST  2 VIEW  COMPARISON:  06/26/2014  FINDINGS: Normal cardiac silhouette. There is fine airspace disease in the lingula unchanged from prior. There is chronic bronchitic markings. No pneumothorax. Trace pleural effusions. Hyperinflated lungs.  IMPRESSION: Mild airspace disease in the lingula not changed. Differential includes asymmetric edema, pneumonia, and cannot exclude pulmonary hemorrhage in patient with hemoptysis.  Hyperinflated lungs with bronchitic change.   Electronically Signed   By: Caleb Bouchard M.D.   On: 06/27/2014 09:38   Dg Chest 2 View  06/27/2014   CLINICAL DATA:  Bloody sputum for 1 day.  History  of TB.  EXAM: CHEST  2 VIEW  COMPARISON:  01/08/2012  FINDINGS: Borderline heart size with normal pulmonary vascularity. Emphysematous changes in the lungs with scattered fibrosis and chronic bronchitic changes. There appears to be superimposed acute nodular infiltration in the left lower lung. This could be due to parenchymal hemorrhage. Reactivated TB or acute pneumonia cannot be excluded. Bilateral central bronchiectasis. Stable appearance of right lateral pleural thickening.  IMPRESSION: Chronic emphysematous and fibrotic changes with old right pleural thickening. Suggestion of superimposed nodular airspace disease in the left lower lung. Bronchiectasis.   Electronically Signed   By: Caleb Capers M.D.   On: 06/27/2014 00:26   Ct Chest W Contrast  06/27/2014   CLINICAL DATA:  Hemoptysis ; past history TB, diabetes, coronary artery disease, GERD  EXAM: CT CHEST WITH CONTRAST  TECHNIQUE: Multidetector CT imaging of the chest was performed during intravenous contrast administration. Sagittal and coronal MPR images reconstructed from axial data set.  CONTRAST:  44mL OMNIPAQUE IOHEXOL 300 MG/ML  SOLN IV  COMPARISON:  07/06/2009  FINDINGS: Extensive atherosclerotic calcifications aorta and coronary arteries.  Gas distention of esophagus to the distal esophagus were questionable wall thickening is identified above a small hiatal hernia.  No thoracic adenopathy.  Pneumobilia unchanged, question prior ERCP with sphincterotomy or biliary bypass procedure.  Remaining visualized upper abdomen unremarkable.  Atelectasis at base of RIGHT lower lobe and in lingula.  Scattered peribronchial thickening and scattered subpleural blebs.  Question minimal bronchiectasis in lingula.  Small foci of infiltrate in RIGHT upper and RIGHT lower lobes.  Nonspecific 5 mm RIGHT upper lobe nodule image 23 not definitely seen previously.  Additional area of slight nodularity in the RIGHT upper lobe 7 mm diameter image 21 unchanged.  No  additional infiltrate, pleural effusion, pneumothorax or mass/ nodule.  Old fractures lateral RIGHT fifth and sixth ribs.  Diffuse osseous demineralization.  IMPRESSION: Changes of COPD/bronchitis with minimal bronchiectasis in lingula.  Stable 7 mm RIGHT lung nodule with question new 5 mm RIGHT upper lobe nodule, recommendation be low.  Small focus of infiltrate in RIGHT upper and RIGHT lower lobes.  Extensive atherosclerotic calcification aorta, great vessels and coronary arteries.  Small hiatal hernia with question distal esophageal wall thickening; correlation for symptoms of gastroesophageal reflux recommended and consider follow-up esophagram or upper endoscopy.  If the patient is at high risk for bronchogenic carcinoma,  follow-up chest CT at 6-12 months is recommended. If the patient is at low risk for bronchogenic carcinoma, follow-up chest CT at 12 months is recommended. This recommendation follows the consensus statement: Guidelines for Management of Small Pulmonary Nodules Detected on CT Scans: A Statement from the [place] as published in Radiology 2005;237:395-400.   Electronically Signed   By: Caleb Dana M.D.   On: 06/27/2014 11:39    Lab Results: Basic Metabolic Panel:  Recent Labs  06/28/14 0453  NA 139  K 4.8  CL 101  CO2 22  GLUCOSE 260*  BUN 24*  CREATININE 0.60  CALCIUM 8.8   Liver Function Tests:  Recent Labs  06/28/14 0453  AST 14  ALT 21  ALKPHOS 72  BILITOT 0.3  PROT 6.3  ALBUMIN 3.4*     CBC:  Recent Labs  06/27/14 1403 06/28/14 0453  WBC 9.3 8.4  NEUTROABS 8.6*  --   HGB 12.6* 12.3*  HCT 38.7* 36.8*  MCV 90.4 89.8  PLT 225 208    Recent Results (from the past 240 hour(s))  MRSA PCR SCREENING     Status: Abnormal   Collection Time    06/27/14 12:07 PM      Result Value Ref Range Status   MRSA by PCR POSITIVE (*) NEGATIVE Final   Comment: RESULT CALLED TO, READ BACK BY AND VERIFIED WITH:     STONE A. AT 1443 ON [id number] BY THOMPSON  S.                The GeneXpert MRSA Assay (FDA     approved for NASAL specimens     only), is one component of a     comprehensive MRSA colonization     surveillance program. It is not     intended to diagnose MRSA     infection nor to guide or     monitor treatment for     MRSA infections.     Hospital Course: This is a 79 year old who came to the emergency department twice in a 24-hour period with cough congestion and hemoptysis. He is on chronic Coumadin therapy that is followed at the [place] [facility] in [place] and has been having adjustments made in his Coumadin dose. He was somewhat over anticoagulated. He was initially seen and allowed to go home but continued to have trouble with more hemoptysis and was admitted at that point. His hemoptysis was not masses. His Coumadin was initially reversed and his hemoptysis essentially stopped except for some flecks of blood in his sputum. He was treated with antibiotics steroids inhaled bronchodilators etc. He improved over the next several days was able to ambulate in the halls. Without difficulty  Discharge Exam: Blood pressure 119/64, pulse 58, temperature 98.2 F (36.8 C), temperature source Oral, resp. rate 17, height 5\' 9"  (1.753 m), weight 95.2 kg (209 lb 14.1 oz), SpO2 91.00%. He is awake and alert. He appears to be in sinus rhythm. He has some rhonchi on his chest exam  Disposition: Home he says he does not need any home health services et [facility].      Discharge Instructions   Discharge patient    Complete by:  As directed              Signed: Aylinn Rydberg L   06/30/2014, 8:48 AM

## 2014-06-30 NOTE — Progress Notes (Signed)
He feels much better. He has no new complaints. He has walked several times in the hall without any difficulty. He is still coughing but not coughing up any blood. I think she's ready for discharge. His chest is much clearer and he generally looks much better

## 2014-09-16 ENCOUNTER — Encounter: Payer: Self-pay | Admitting: Cardiology

## 2014-09-16 ENCOUNTER — Ambulatory Visit (INDEPENDENT_AMBULATORY_CARE_PROVIDER_SITE_OTHER): Payer: Medicare Other | Admitting: Cardiology

## 2014-09-16 VITALS — BP 104/58 | HR 85 | Ht 69.0 in | Wt 214.0 lb

## 2014-09-16 DIAGNOSIS — I4891 Unspecified atrial fibrillation: Secondary | ICD-10-CM

## 2014-09-16 DIAGNOSIS — I2510 Atherosclerotic heart disease of native coronary artery without angina pectoris: Secondary | ICD-10-CM

## 2014-09-16 DIAGNOSIS — E782 Mixed hyperlipidemia: Secondary | ICD-10-CM

## 2014-09-16 DIAGNOSIS — I10 Essential (primary) hypertension: Secondary | ICD-10-CM

## 2014-09-16 DIAGNOSIS — R011 Cardiac murmur, unspecified: Secondary | ICD-10-CM

## 2014-09-16 MED ORDER — AMLODIPINE BESYLATE 5 MG PO TABS: 5.0000 mg | ORAL_TABLET | Freq: Every day | ORAL | Status: DC

## 2014-09-16 MED ORDER — ROSUVASTATIN CALCIUM 20 MG PO TABS: 20.0000 mg | ORAL_TABLET | Freq: Every day | ORAL | Status: DC

## 2014-09-16 NOTE — Patient Instructions (Addendum)
Your physician wants you to follow-up in: 6 months You will receive a reminder letter in the mail two months in advance. If you don't receive a letter, please call our office to schedule the follow-up appointment.    Your physician has recommended you make the following change in your medication:    DECREASE Norvasc to 5 mg daily   INCREASE Crestor to 20 mg daily  I have given you a prescription for the V.A.  Your physician has requested that you have an echocardiogram. Echocardiography is a painless test that uses sound waves to create images of your heart. It provides your doctor with information about the size and shape of your heart and how well your heart's chambers and valves are working. This procedure takes approximately one hour. There are no restrictions for this procedure.

## 2014-09-16 NOTE — Progress Notes (Signed)
Clinical Summary [name] is a 79 y.o.male last seen by Dr Caleb [name], this is our first visit together. He is seen for the following medical problems.  1. Paroxysmal afib - denies any significant palpitations - on coumadin, denies any recent bleeding. Did have some hemoptysis in 06/2014 when had pneumonia but this has resolved and not reoccured  2. HTN - does not check at home - compliant with meds. Can have some lightheadedness/dizziness with standing daily.   3. Hyperlipidemia - compliant with crestor  4. DM type II - managed by pcp  5. COPD - followed by pcp  6. CAD - cath 2007 patent vessels other than small non-dominant RCA with99% prox and  70% mid disease. LVEF at that time 60%. RCA disease thought not a candidate for intervention, managed medically.  - denies any recent chest pain, no SOB.   Past Medical History  Diagnosis Date  . Acute exacerbation of chronic bronchitis   . History of tuberculosis   . CAD (coronary artery disease)   . Hyperlipidemia   . BPH (benign prostatic hypertrophy)   . Thyroid disease     hyperthyroidism  . Diabetes mellitus without complication   . GERD (gastroesophageal reflux disease)   . Diverticulitis      Allergies  Allergen Reactions  . Levaquin [Levofloxacin In D5w]     SWELLING     Current Outpatient Prescriptions  Medication Sig Dispense Refill  . allopurinol (ZYLOPRIM) 300 MG tablet Take 300 mg by mouth daily.      Marland Kitchen amLODipine (NORVASC) 10 MG tablet Take 10 mg by mouth daily.      . Ascorbic Acid (VITAMIN C) 500 MG tablet Take 500 mg by mouth 2 (two) times daily.       Marland Kitchen aspirin EC 81 MG tablet Take 81 mg by mouth daily.      . cephALEXin (KEFLEX) 500 MG capsule Take 1 capsule (500 mg total) by mouth 3 (three) times daily.  21 capsule  0  . cholecalciferol (VITAMIN D) 1000 UNITS tablet Take 4,000 Units by mouth daily.      . clobetasol cream (TEMOVATE) 0.17 % Apply 1 application topically 2 (two) times daily as  needed (Dry Skin).       Marland Kitchen diltiazem (DILACOR XR) 240 MG 24 hr capsule Take 240 mg by mouth daily.        Marland Kitchen docusate sodium (COLACE) 100 MG capsule Take 300 mg by mouth 2 (two) times daily.      . finasteride (PROSCAR) 5 MG tablet Take 5 mg by mouth daily.        . folic acid (FOLVITE) 1 MG tablet Take 1 mg by mouth daily.      Marland Kitchen galantamine (RAZADYNE) 8 MG tablet Take 8 mg by mouth 2 (two) times daily.        Marland Kitchen glyBURIDE (DIABETA) 5 MG tablet Take 10 mg by mouth 2 (two) times daily with a meal. 2 in the morning and 1 tab at night      . guaiFENesin 200 MG tablet Take 400 mg by mouth 2 (two) times daily.      Marland Kitchen lisinopril (PRINIVIL,ZESTRIL) 20 MG tablet Take 10 mg by mouth daily.      Marland Kitchen loratadine (CLARITIN) 10 MG tablet Take 10 mg by mouth daily.        . magnesium oxide (MAG-OX) 400 MG tablet Take 400 mg by mouth 2 (two) times daily.       Marland Kitchen  Multiple Vitamin (MULTIVITAMIN WITH MINERALS) TABS tablet Take 1 tablet by mouth daily.      Marland Kitchen omeprazole (PRILOSEC) 20 MG capsule Take 20 mg by mouth 2 (two) times daily.        Marland Kitchen oxybutynin (DITROPAN) 5 MG tablet Take 5 mg by mouth 2 (two) times daily.       . rosuvastatin (CRESTOR) 10 MG tablet Take 5 mg by mouth daily.      Marland Kitchen tiotropium (SPIRIVA) 18 MCG inhalation capsule Place 18 mcg into inhaler and inhale as needed.      . warfarin (COUMADIN) 5 MG tablet Take 5 mg by mouth daily.       No current facility-administered medications for this visit.     Past Surgical History  Procedure Laterality Date  . Cholecystectomy    . Thyroidectomy       Allergies  Allergen Reactions  . Levaquin [Levofloxacin In D5w]     SWELLING      Family History  Problem Relation Age of Onset  . Heart attack Father   . Heart failure Mother      Social History [name] reports that he quit smoking about 15 years ago. His smoking use included Cigarettes. He has a 37.5 pack-year smoking history. He has never used smokeless tobacco. [name]  reports that he does not drink alcohol.   Review of Systems CONSTITUTIONAL: No weight loss, fever, chills, weakness or fatigue.  HEENT: Eyes: No visual loss, blurred vision, double vision or yellow sclerae.No hearing loss, sneezing, congestion, runny nose or sore throat.  SKIN: No rash or itching.  CARDIOVASCULAR: per HPI RESPIRATORY: No shortness of breath, cough or sputum.  GASTROINTESTINAL: No anorexia, nausea, vomiting or diarrhea. No abdominal pain or blood.  GENITOURINARY: No burning on urination, no polyuria NEUROLOGICAL: No headache, dizziness, syncope, paralysis, ataxia, numbness or tingling in the extremities. No change in bowel or bladder control.  MUSCULOSKELETAL: No muscle, back pain, joint pain or stiffness.  LYMPHATICS: No enlarged nodes. No history of splenectomy.  PSYCHIATRIC: No history of depression or anxiety.  ENDOCRINOLOGIC: No reports of sweating, cold or heat intolerance. No polyuria or polydipsia.  Marland Kitchen   Physical Examination p 85 bp 104/58 Wt 214 lbs BMI 32 Gen: resting comfortably, no acute distress HEENT: no scleral icterus, pupils equal round and reactive, no palptable cervical adenopathy,  CV: RRR, 3/6 systolic murmur at apex, no JVD, no carotid bruits Resp: Clear to auscultation bilaterally GI: abdomen is soft, non-tender, non-distended, normal bowel sounds, no hepatosplenomegaly MSK: extremities are warm, no edema.  Skin: warm, no rash Neuro:  no focal deficits Psych: appropriate affect   Diagnostic Studies 05/2006 Cath RESULTS: Right heart pressures: Right atrial pressure A-wave 10, V-wave 8,  mean of 6 mmHg.  Right ventricular pressure 31/5 with an end-diastolic pressure of 8 mmHg.  Pulmonary artery pressure 25/10 with a mean of 17 mmHg.  Pulmonary capillary wedge pressure A-wave of 14, V-wave of 12, mean of 11  mmHg.  Aortic pressure 139/77, mean of 96 mmHg.  Left ventricular pressure 132/7 with an end-diastolic pressure of 14 mmHg.  Pulmonary  artery saturation was 62%. Aortic saturation was 89%. Cardiac  output by the thermodilution method 4.6 with an index of 2.3 L per minute  per sq m. Cardiac index of the Fick method 5 with an index of 2.5 L per  minute per sq m.  CORONARY ANGIOGRAPHY: The left main coronary artery is a large vessel which  is angiographically normal.  The left anterior descending coronary artery is a large artery which has  calcium from its proximal to mid portion. There is a diagonal branch that  comes out from the calcium that has some luminal irregularities. There are  two other diagonals which are moderate caliber and have luminal  irregularities with some ostial non-obstructive disease.  The circumflex coronary artery is a large dominant vessel with one moderate  caliber obtuse marginal, two posterior lateral branches which are moderate,  and a posterior descending coronary artery which is small and is diffusely  diseased, but does not appear to have significant obstructive disease.  The right coronary artery is a small nondominant vessel with a 99% proximal  stenosis and a 70% mid stenosis.  Left ventriculogram reveals ejection fraction of 60% with no obvious wall  motion abnormalities. There is 1+ mitral regurgitation.  ASSESSMENT:  1. Left dominant circulation.  2. Obstructive disease in a nondominant right coronary artery.  3. Preserved left ventricular systolic function.  4. Preserved right heart pressures.  PLAN: Plan then would be medical therapy for this nondominant right.  Certainly the ischemia which is in the inferior wall is likely due to the  diffuse disease in the posterior descending distribution. I do not believe  looking at the films that this is an appropriate target for intervention nor  do I think he has good surgical targets there. His LV systolic function is  normal so I am going to lean towards using aggressive risk factor  modification and management. The other issue is his  atrial fibrillation.  He will require Coumadin. As such, he will be anticoagulated.     Assessment and Plan  1. Parox afib - no current symptoms - continue rate control with dilt and anticoag with coumadin  2. HTN - at goal, on low end of normal. Describes dizziness with standing fairly regularly, will decrease norvasc to 5mg  daily. This is our first visit together, of note he is on both norvasc and dilt, would lean towards weaning norvasc if not needed to avoid dual CCBs. If needed can titrate up further his lisinopril. F/u bp next visit, likely stop norvasc pending his bp numbers  3. Hyperlipidemia - given his known CAD and most recent lipid guidelines he should be on high dose statin therapy, will increase crestor to 20mg  daily.  4. DM type II  - per pcp  5. COPD - per pcp  6. CAD - no current symptoms, continue risk factor modification and secondary prevention  7. Heart murmur - check echo   F/u 6 months   Arnoldo Lenis, M.D.

## 2014-09-24 ENCOUNTER — Ambulatory Visit (HOSPITAL_COMMUNITY)
Admission: RE | Admit: 2014-09-24 | Discharge: 2014-09-24 | Disposition: A | Discharge status: Home / Self Care | Payer: Medicare Other | Source: Ambulatory Visit | Attending: Cardiology | Admitting: Cardiology

## 2014-09-24 DIAGNOSIS — R011 Cardiac murmur, unspecified: Secondary | ICD-10-CM | POA: Insufficient documentation

## 2014-09-24 DIAGNOSIS — I4891 Unspecified atrial fibrillation: Secondary | ICD-10-CM | POA: Insufficient documentation

## 2014-09-24 DIAGNOSIS — I2510 Atherosclerotic heart disease of native coronary artery without angina pectoris: Secondary | ICD-10-CM | POA: Insufficient documentation

## 2014-09-24 DIAGNOSIS — I517 Cardiomegaly: Secondary | ICD-10-CM | POA: Diagnosis not present

## 2014-09-24 NOTE — Progress Notes (Signed)
  Echocardiogram 2D Echocardiogram has been performed.  [place], [place] 09/24/2014, 12:17 PM

## 2014-11-22 ENCOUNTER — Other Ambulatory Visit: Payer: Self-pay

## 2014-11-22 ENCOUNTER — Telehealth: Payer: Self-pay | Admitting: *Deleted

## 2014-11-22 NOTE — Telephone Encounter (Signed)
I spoke with Anderson Malta at the [facility]. I clarified that patient was increased to Crestor 20 mg on Sep 16 2014. She states patient is confused regarding all his medication doses and didn't know who his cardiologist was.She will fax me any lab work they do

## 2014-11-22 NOTE — Telephone Encounter (Signed)
Needs clarification on crestor direction. Pt states he is suppose to be taking 1 1/2 tabs a day 60 mg. They have him down for taking 20 mg daily.

## 2014-12-20 DIAGNOSIS — J449 Chronic obstructive pulmonary disease, unspecified: Secondary | ICD-10-CM | POA: Diagnosis not present

## 2014-12-20 DIAGNOSIS — J219 Acute bronchiolitis, unspecified: Secondary | ICD-10-CM | POA: Diagnosis not present

## 2014-12-20 DIAGNOSIS — I10 Essential (primary) hypertension: Secondary | ICD-10-CM | POA: Diagnosis not present

## 2015-01-14 DIAGNOSIS — K5792 Diverticulitis of intestine, part unspecified, without perforation or abscess without bleeding: Secondary | ICD-10-CM | POA: Diagnosis not present

## 2015-01-14 DIAGNOSIS — I10 Essential (primary) hypertension: Secondary | ICD-10-CM | POA: Diagnosis not present

## 2015-01-14 DIAGNOSIS — J449 Chronic obstructive pulmonary disease, unspecified: Secondary | ICD-10-CM | POA: Diagnosis not present

## 2015-01-14 DIAGNOSIS — I2510 Atherosclerotic heart disease of native coronary artery without angina pectoris: Secondary | ICD-10-CM | POA: Diagnosis not present

## 2015-01-17 DIAGNOSIS — J449 Chronic obstructive pulmonary disease, unspecified: Secondary | ICD-10-CM | POA: Diagnosis not present

## 2015-01-17 DIAGNOSIS — K5792 Diverticulitis of intestine, part unspecified, without perforation or abscess without bleeding: Secondary | ICD-10-CM | POA: Diagnosis not present

## 2015-02-01 DIAGNOSIS — J449 Chronic obstructive pulmonary disease, unspecified: Secondary | ICD-10-CM | POA: Diagnosis not present

## 2015-02-01 DIAGNOSIS — I10 Essential (primary) hypertension: Secondary | ICD-10-CM | POA: Diagnosis not present

## 2015-02-01 DIAGNOSIS — I2510 Atherosclerotic heart disease of native coronary artery without angina pectoris: Secondary | ICD-10-CM | POA: Diagnosis not present

## 2015-02-01 DIAGNOSIS — K5792 Diverticulitis of intestine, part unspecified, without perforation or abscess without bleeding: Secondary | ICD-10-CM | POA: Diagnosis not present

## 2015-02-16 DIAGNOSIS — J449 Chronic obstructive pulmonary disease, unspecified: Secondary | ICD-10-CM | POA: Diagnosis not present

## 2015-02-16 DIAGNOSIS — J209 Acute bronchitis, unspecified: Secondary | ICD-10-CM | POA: Diagnosis not present

## 2015-03-09 ENCOUNTER — Ambulatory Visit: Payer: Non-veteran care | Admitting: Cardiology

## 2015-05-02 DIAGNOSIS — Z0000 Encounter for general adult medical examination without abnormal findings: Secondary | ICD-10-CM | POA: Diagnosis not present

## 2015-08-02 DIAGNOSIS — J209 Acute bronchitis, unspecified: Secondary | ICD-10-CM | POA: Diagnosis not present

## 2015-08-02 DIAGNOSIS — I10 Essential (primary) hypertension: Secondary | ICD-10-CM | POA: Diagnosis not present

## 2015-08-02 DIAGNOSIS — J449 Chronic obstructive pulmonary disease, unspecified: Secondary | ICD-10-CM | POA: Diagnosis not present

## 2015-08-02 DIAGNOSIS — E119 Type 2 diabetes mellitus without complications: Secondary | ICD-10-CM | POA: Diagnosis not present

## 2015-08-03 DIAGNOSIS — J209 Acute bronchitis, unspecified: Secondary | ICD-10-CM | POA: Diagnosis not present

## 2015-08-03 DIAGNOSIS — E119 Type 2 diabetes mellitus without complications: Secondary | ICD-10-CM | POA: Diagnosis not present

## 2015-08-03 DIAGNOSIS — I4891 Unspecified atrial fibrillation: Secondary | ICD-10-CM | POA: Diagnosis not present

## 2015-08-03 DIAGNOSIS — I10 Essential (primary) hypertension: Secondary | ICD-10-CM | POA: Diagnosis not present

## 2015-08-03 DIAGNOSIS — I2510 Atherosclerotic heart disease of native coronary artery without angina pectoris: Secondary | ICD-10-CM | POA: Diagnosis not present

## 2015-09-01 DIAGNOSIS — Z23 Encounter for immunization: Secondary | ICD-10-CM | POA: Diagnosis not present

## 2015-09-05 DIAGNOSIS — K529 Noninfective gastroenteritis and colitis, unspecified: Secondary | ICD-10-CM | POA: Diagnosis not present

## 2015-09-05 DIAGNOSIS — E1165 Type 2 diabetes mellitus with hyperglycemia: Secondary | ICD-10-CM | POA: Diagnosis not present

## 2015-09-05 DIAGNOSIS — J441 Chronic obstructive pulmonary disease with (acute) exacerbation: Secondary | ICD-10-CM | POA: Diagnosis not present

## 2015-10-13 ENCOUNTER — Ambulatory Visit: Payer: Self-pay | Admitting: *Deleted

## 2015-10-13 DIAGNOSIS — Z7901 Long term (current) use of anticoagulants: Secondary | ICD-10-CM

## 2015-10-13 DIAGNOSIS — I4891 Unspecified atrial fibrillation: Secondary | ICD-10-CM

## 2015-11-01 DIAGNOSIS — I4891 Unspecified atrial fibrillation: Secondary | ICD-10-CM | POA: Diagnosis not present

## 2015-11-01 DIAGNOSIS — I10 Essential (primary) hypertension: Secondary | ICD-10-CM | POA: Diagnosis not present

## 2015-11-01 DIAGNOSIS — J449 Chronic obstructive pulmonary disease, unspecified: Secondary | ICD-10-CM | POA: Diagnosis not present

## 2015-11-01 DIAGNOSIS — I2510 Atherosclerotic heart disease of native coronary artery without angina pectoris: Secondary | ICD-10-CM | POA: Diagnosis not present

## 2015-11-07 DIAGNOSIS — K521 Toxic gastroenteritis and colitis: Secondary | ICD-10-CM | POA: Diagnosis not present

## 2015-11-07 DIAGNOSIS — I4891 Unspecified atrial fibrillation: Secondary | ICD-10-CM | POA: Diagnosis not present

## 2015-11-07 DIAGNOSIS — J449 Chronic obstructive pulmonary disease, unspecified: Secondary | ICD-10-CM | POA: Diagnosis not present

## 2015-11-20 ENCOUNTER — Encounter (HOSPITAL_COMMUNITY): Payer: Self-pay | Admitting: Emergency Medicine

## 2015-11-20 ENCOUNTER — Emergency Department (HOSPITAL_COMMUNITY): Payer: Medicare Other

## 2015-11-20 ENCOUNTER — Inpatient Hospital Stay (HOSPITAL_COMMUNITY)
Admission: EM | Admit: 2015-11-20 | Discharge: 2015-11-26 | DRG: 871 | Disposition: A | Discharge status: Home / Self Care | Payer: Medicare Other | Attending: Pulmonary Disease | Admitting: Pulmonary Disease

## 2015-11-20 DIAGNOSIS — I482 Chronic atrial fibrillation: Secondary | ICD-10-CM | POA: Diagnosis not present

## 2015-11-20 DIAGNOSIS — K449 Diaphragmatic hernia without obstruction or gangrene: Secondary | ICD-10-CM | POA: Insufficient documentation

## 2015-11-20 DIAGNOSIS — D122 Benign neoplasm of ascending colon: Secondary | ICD-10-CM | POA: Diagnosis present

## 2015-11-20 DIAGNOSIS — N400 Enlarged prostate without lower urinary tract symptoms: Secondary | ICD-10-CM | POA: Diagnosis not present

## 2015-11-20 DIAGNOSIS — J189 Pneumonia, unspecified organism: Secondary | ICD-10-CM

## 2015-11-20 DIAGNOSIS — Z8249 Family history of ischemic heart disease and other diseases of the circulatory system: Secondary | ICD-10-CM

## 2015-11-20 DIAGNOSIS — Z87891 Personal history of nicotine dependence: Secondary | ICD-10-CM

## 2015-11-20 DIAGNOSIS — J440 Chronic obstructive pulmonary disease with acute lower respiratory infection: Secondary | ICD-10-CM | POA: Diagnosis present

## 2015-11-20 DIAGNOSIS — D62 Acute posthemorrhagic anemia: Secondary | ICD-10-CM | POA: Diagnosis not present

## 2015-11-20 DIAGNOSIS — Z7982 Long term (current) use of aspirin: Secondary | ICD-10-CM | POA: Diagnosis not present

## 2015-11-20 DIAGNOSIS — K5750 Diverticulosis of both small and large intestine without perforation or abscess without bleeding: Secondary | ICD-10-CM | POA: Diagnosis not present

## 2015-11-20 DIAGNOSIS — K5710 Diverticulosis of small intestine without perforation or abscess without bleeding: Secondary | ICD-10-CM | POA: Diagnosis not present

## 2015-11-20 DIAGNOSIS — J439 Emphysema, unspecified: Secondary | ICD-10-CM

## 2015-11-20 DIAGNOSIS — Z79899 Other long term (current) drug therapy: Secondary | ICD-10-CM

## 2015-11-20 DIAGNOSIS — E785 Hyperlipidemia, unspecified: Secondary | ICD-10-CM | POA: Diagnosis present

## 2015-11-20 DIAGNOSIS — J441 Chronic obstructive pulmonary disease with (acute) exacerbation: Secondary | ICD-10-CM | POA: Diagnosis present

## 2015-11-20 DIAGNOSIS — R071 Chest pain on breathing: Secondary | ICD-10-CM

## 2015-11-20 DIAGNOSIS — K5730 Diverticulosis of large intestine without perforation or abscess without bleeding: Secondary | ICD-10-CM | POA: Diagnosis not present

## 2015-11-20 DIAGNOSIS — I10 Essential (primary) hypertension: Secondary | ICD-10-CM

## 2015-11-20 DIAGNOSIS — R1319 Other dysphagia: Secondary | ICD-10-CM | POA: Diagnosis not present

## 2015-11-20 DIAGNOSIS — J9601 Acute respiratory failure with hypoxia: Secondary | ICD-10-CM | POA: Diagnosis not present

## 2015-11-20 DIAGNOSIS — A419 Sepsis, unspecified organism: Secondary | ICD-10-CM | POA: Diagnosis not present

## 2015-11-20 DIAGNOSIS — K219 Gastro-esophageal reflux disease without esophagitis: Secondary | ICD-10-CM | POA: Diagnosis present

## 2015-11-20 DIAGNOSIS — R079 Chest pain, unspecified: Secondary | ICD-10-CM | POA: Diagnosis not present

## 2015-11-20 DIAGNOSIS — J449 Chronic obstructive pulmonary disease, unspecified: Secondary | ICD-10-CM | POA: Diagnosis present

## 2015-11-20 DIAGNOSIS — E119 Type 2 diabetes mellitus without complications: Secondary | ICD-10-CM | POA: Diagnosis not present

## 2015-11-20 DIAGNOSIS — Z794 Long term (current) use of insulin: Secondary | ICD-10-CM | POA: Diagnosis not present

## 2015-11-20 DIAGNOSIS — Z800 Family history of malignant neoplasm of digestive organs: Secondary | ICD-10-CM

## 2015-11-20 DIAGNOSIS — K921 Melena: Secondary | ICD-10-CM | POA: Diagnosis not present

## 2015-11-20 DIAGNOSIS — Z86010 Personal history of colonic polyps: Secondary | ICD-10-CM | POA: Insufficient documentation

## 2015-11-20 DIAGNOSIS — Z23 Encounter for immunization: Secondary | ICD-10-CM | POA: Diagnosis not present

## 2015-11-20 DIAGNOSIS — I4891 Unspecified atrial fibrillation: Secondary | ICD-10-CM | POA: Diagnosis present

## 2015-11-20 DIAGNOSIS — Z7901 Long term (current) use of anticoagulants: Secondary | ICD-10-CM | POA: Diagnosis not present

## 2015-11-20 DIAGNOSIS — K922 Gastrointestinal hemorrhage, unspecified: Secondary | ICD-10-CM | POA: Diagnosis not present

## 2015-11-20 DIAGNOSIS — R1310 Dysphagia, unspecified: Secondary | ICD-10-CM | POA: Insufficient documentation

## 2015-11-20 DIAGNOSIS — I480 Paroxysmal atrial fibrillation: Secondary | ICD-10-CM | POA: Diagnosis not present

## 2015-11-20 LAB — GLUCOSE, CAPILLARY
GLUCOSE-CAPILLARY: 151 mg/dL — AB (ref 65–99)
Glucose-Capillary: 136 mg/dL — ABNORMAL HIGH (ref 65–99)

## 2015-11-20 LAB — CBC WITH DIFFERENTIAL/PLATELET
Basophils Absolute: 0 10*3/uL (ref 0.0–0.1)
Basophils Relative: 0 %
EOS PCT: 0 %
Eosinophils Absolute: 0 10*3/uL (ref 0.0–0.7)
HCT: 29.9 % — ABNORMAL LOW (ref 39.0–52.0)
HEMOGLOBIN: 9.5 g/dL — AB (ref 13.0–17.0)
LYMPHS PCT: 6 %
Lymphs Abs: 1.5 10*3/uL (ref 0.7–4.0)
MCH: 29.5 pg (ref 26.0–34.0)
MCHC: 31.8 g/dL (ref 30.0–36.0)
MCV: 92.9 fL (ref 78.0–100.0)
MONO ABS: 1 10*3/uL (ref 0.1–1.0)
MONOS PCT: 4 %
NEUTROS PCT: 90 %
Neutro Abs: 22.6 10*3/uL — ABNORMAL HIGH (ref 1.7–7.7)
Platelets: 302 10*3/uL (ref 150–400)
RBC: 3.22 MIL/uL — AB (ref 4.22–5.81)
RDW: 15.5 % (ref 11.5–15.5)
WBC: 25.1 10*3/uL — AB (ref 4.0–10.5)

## 2015-11-20 LAB — BASIC METABOLIC PANEL WITH GFR
Anion gap: 11 (ref 5–15)
BUN: 45 mg/dL — ABNORMAL HIGH (ref 6–20)
CO2: 23 mmol/L (ref 22–32)
Calcium: 8.7 mg/dL — ABNORMAL LOW (ref 8.9–10.3)
Chloride: 104 mmol/L (ref 101–111)
Creatinine, Ser: 0.71 mg/dL (ref 0.61–1.24)
GFR calc Af Amer: >60 mL/min (ref >60)
GFR calc non Af Amer: >60 mL/min (ref >60)
Glucose, Bld: 149 mg/dL — ABNORMAL HIGH (ref 65–99)
Potassium: 4.4 mmol/L (ref 3.5–5.1)
Sodium: 138 mmol/L (ref 135–145)

## 2015-11-20 LAB — TROPONIN I
Troponin I: <0.03 ng/mL (ref <0.031)
Troponin I: <0.03 ng/mL (ref <0.031)

## 2015-11-20 LAB — PROTIME-INR
INR: 4.29 — AB (ref 0.00–1.49)
Prothrombin Time: 40.1 seconds — ABNORMAL HIGH (ref 11.6–15.2)

## 2015-11-20 LAB — PROCALCITONIN: Procalcitonin: <0.1 ng/mL

## 2015-11-20 LAB — LACTIC ACID, PLASMA
LACTIC ACID, VENOUS: 2.7 mmol/L — AB (ref 0.5–2.0)
Lactic Acid, Venous: 2.8 mmol/L (ref 0.5–2.0)
Lactic Acid, Venous: 1.8 mmol/L (ref 0.5–2.0)

## 2015-11-20 MED ORDER — PANTOPRAZOLE SODIUM 40 MG PO TBEC
40.0000 mg | DELAYED_RELEASE_TABLET | Freq: Two times a day (BID) | ORAL | Status: DC
  Administered 2015-11-20 – 2015-11-26 (×11): 40 mg via ORAL
  Filled 2015-11-20 (×12): qty 1

## 2015-11-20 MED ORDER — LORATADINE 10 MG PO TABS
10.0000 mg | ORAL_TABLET | Freq: Every day | ORAL | Status: DC
  Administered 2015-11-21 – 2015-11-26 (×5): 10 mg via ORAL
  Filled 2015-11-20 (×5): qty 1

## 2015-11-20 MED ORDER — INFLUENZA VAC SPLIT QUAD 0.5 ML IM SUSY
0.5000 mL | PREFILLED_SYRINGE | INTRAMUSCULAR | Status: DC
  Filled 2015-11-20: qty 0.5

## 2015-11-20 MED ORDER — CEFTRIAXONE SODIUM 1 G IJ SOLR
1.0000 g | Freq: Once | INTRAMUSCULAR | Status: AC
  Administered 2015-11-20: 1 g via INTRAVENOUS
  Filled 2015-11-20: qty 10

## 2015-11-20 MED ORDER — AZTREONAM 1 G IJ SOLR
INTRAMUSCULAR | Status: AC
  Filled 2015-11-20: qty 1

## 2015-11-20 MED ORDER — DEXTROSE 5 % IV SOLN: 500.0000 mg | Freq: Once | INTRAVENOUS | Status: DC

## 2015-11-20 MED ORDER — SODIUM CHLORIDE 0.9 % IV BOLUS (SEPSIS): 1000.0000 mL | INTRAVENOUS | Status: AC

## 2015-11-20 MED ORDER — SODIUM CHLORIDE 0.9 % IV BOLUS (SEPSIS)
1000.0000 mL | Freq: Once | INTRAVENOUS | Status: AC
  Administered 2015-11-20: 1000 mL via INTRAVENOUS

## 2015-11-20 MED ORDER — IPRATROPIUM-ALBUTEROL 0.5-2.5 (3) MG/3ML IN SOLN
3.0000 mL | RESPIRATORY_TRACT | Status: DC
  Administered 2015-11-20 – 2015-11-23 (×17): 3 mL via RESPIRATORY_TRACT
  Filled 2015-11-20 (×17): qty 3

## 2015-11-20 MED ORDER — INSULIN GLARGINE 100 UNIT/ML ~~LOC~~ SOLN
30.0000 [IU] | Freq: Every day | SUBCUTANEOUS | Status: DC
  Administered 2015-11-20 – 2015-11-25 (×5): 30 [IU] via SUBCUTANEOUS
  Filled 2015-11-20 (×8): qty 0.3

## 2015-11-20 MED ORDER — ALLOPURINOL 300 MG PO TABS
300.0000 mg | ORAL_TABLET | Freq: Every day | ORAL | Status: DC
  Administered 2015-11-21 – 2015-11-26 (×5): 300 mg via ORAL
  Filled 2015-11-20 (×5): qty 1

## 2015-11-20 MED ORDER — DOCUSATE SODIUM 100 MG PO CAPS
300.0000 mg | ORAL_CAPSULE | Freq: Two times a day (BID) | ORAL | Status: DC
  Administered 2015-11-20 – 2015-11-26 (×11): 300 mg via ORAL
  Filled 2015-11-20 (×12): qty 3

## 2015-11-20 MED ORDER — ASPIRIN EC 81 MG PO TBEC
81.0000 mg | DELAYED_RELEASE_TABLET | Freq: Every day | ORAL | Status: DC
  Administered 2015-11-21 – 2015-11-26 (×5): 81 mg via ORAL
  Filled 2015-11-20 (×5): qty 1

## 2015-11-20 MED ORDER — INSULIN ASPART 100 UNIT/ML ~~LOC~~ SOLN
0.0000 [IU] | Freq: Three times a day (TID) | SUBCUTANEOUS | Status: DC
  Administered 2015-11-21: 5 [IU] via SUBCUTANEOUS
  Administered 2015-11-21: 3 [IU] via SUBCUTANEOUS
  Administered 2015-11-22: 8 [IU] via SUBCUTANEOUS
  Administered 2015-11-22 – 2015-11-23 (×2): 3 [IU] via SUBCUTANEOUS
  Administered 2015-11-23: 8 [IU] via SUBCUTANEOUS
  Administered 2015-11-24 – 2015-11-25 (×3): 2 [IU] via SUBCUTANEOUS
  Administered 2015-11-25: 5 [IU] via SUBCUTANEOUS

## 2015-11-20 MED ORDER — FINASTERIDE 5 MG PO TABS
5.0000 mg | ORAL_TABLET | Freq: Every day | ORAL | Status: DC
  Administered 2015-11-21 – 2015-11-26 (×5): 5 mg via ORAL
  Filled 2015-11-20 (×8): qty 1

## 2015-11-20 MED ORDER — LEVOTHYROXINE SODIUM 125 MCG PO TABS
124.0000 ug | ORAL_TABLET | Freq: Every day | ORAL | Status: DC
  Filled 2015-11-20: qty 1

## 2015-11-20 MED ORDER — DEXTROSE 5 % IV SOLN
500.0000 mg | INTRAVENOUS | Status: DC
  Administered 2015-11-20 – 2015-11-24 (×6): 500 mg via INTRAVENOUS
  Filled 2015-11-20 (×5): qty 500

## 2015-11-20 MED ORDER — GUAIFENESIN 200 MG PO TABS
1200.0000 mg | ORAL_TABLET | Freq: Two times a day (BID) | ORAL | Status: DC
  Filled 2015-11-20 (×3): qty 6

## 2015-11-20 MED ORDER — GALANTAMINE HYDROBROMIDE 4 MG PO TABS
8.0000 mg | ORAL_TABLET | Freq: Two times a day (BID) | ORAL | Status: DC
  Administered 2015-11-21 – 2015-11-26 (×10): 8 mg via ORAL
  Filled 2015-11-20 (×16): qty 2

## 2015-11-20 MED ORDER — OXYBUTYNIN CHLORIDE 5 MG PO TABS
5.0000 mg | ORAL_TABLET | Freq: Two times a day (BID) | ORAL | Status: DC
Start: 2015-11-20 — End: 2015-11-26
  Administered 2015-11-20 – 2015-11-26 (×11): 5 mg via ORAL
  Filled 2015-11-20 (×11): qty 1

## 2015-11-20 MED ORDER — INSULIN ASPART 100 UNIT/ML ~~LOC~~ SOLN
0.0000 [IU] | Freq: Every day | SUBCUTANEOUS | Status: DC
  Administered 2015-11-22 – 2015-11-25 (×2): 2 [IU] via SUBCUTANEOUS

## 2015-11-20 MED ORDER — DEXTROSE 5 % IV SOLN
1.0000 g | INTRAVENOUS | Status: DC
  Administered 2015-11-21 – 2015-11-24 (×4): 1 g via INTRAVENOUS
  Filled 2015-11-20 (×5): qty 10

## 2015-11-20 MED ORDER — MAGNESIUM OXIDE 400 MG PO TABS
400.0000 mg | ORAL_TABLET | Freq: Two times a day (BID) | ORAL | Status: DC
  Administered 2015-11-21: 400 mg via ORAL
  Filled 2015-11-20 (×8): qty 1

## 2015-11-20 MED ORDER — DEXTROSE 5 % IV SOLN
INTRAVENOUS | Status: AC
  Filled 2015-11-20: qty 500

## 2015-11-20 MED ORDER — VITAMIN C 500 MG PO TABS
500.0000 mg | ORAL_TABLET | Freq: Two times a day (BID) | ORAL | Status: DC
  Administered 2015-11-21 – 2015-11-26 (×10): 500 mg via ORAL
  Filled 2015-11-20 (×11): qty 1

## 2015-11-20 MED ORDER — FOLIC ACID 1 MG PO TABS
1.0000 mg | ORAL_TABLET | Freq: Every day | ORAL | Status: DC
  Administered 2015-11-21 – 2015-11-26 (×5): 1 mg via ORAL
  Filled 2015-11-20 (×5): qty 1

## 2015-11-20 MED ORDER — ROSUVASTATIN CALCIUM 20 MG PO TABS
20.0000 mg | ORAL_TABLET | Freq: Every day | ORAL | Status: DC
  Administered 2015-11-20 – 2015-11-26 (×6): 20 mg via ORAL
  Filled 2015-11-20 (×7): qty 1

## 2015-11-20 MED ORDER — IPRATROPIUM-ALBUTEROL 0.5-2.5 (3) MG/3ML IN SOLN
3.0000 mL | Freq: Once | RESPIRATORY_TRACT | Status: AC
  Administered 2015-11-20: 3 mL via RESPIRATORY_TRACT
  Filled 2015-11-20: qty 3

## 2015-11-20 NOTE — Progress Notes (Signed)
Pharmacy Consult:  Warfarin  INR > 4 on admission Plan:  Hold Coumadin tonight  Hart Robinsons, PharmD

## 2015-11-20 NOTE — ED Notes (Signed)
Patient c/o mid-sternal chest pain that radiates down left arm. Per patient pain in left shoulder and arm started yesterday but chest pain started this morning in which he took 1 SL nitro with no relief. Patient has constant congested cough and shortness of breath which patient states "He has had for a long time." Patient reports thick yellow sputum, unsure of fevers. Patient deoes report "sweating, dizziness, and light headness with chest pain. "

## 2015-11-20 NOTE — ED Notes (Signed)
CRITICAL VALUE ALERT  Critical value received:  Lactic acid 2.8  Date of notification:  11/20/15  Time of notification:  9211  Critical value read back:Yes.    Nurse who received alert:  Laurell Josephs RN  MD notified (1st page):  Reather Converse  Time of first page:  1322  MD notified (2nd page):  Time of second page:  Responding MD:  Reather Converse  Time MD responded:  1322

## 2015-11-20 NOTE — Progress Notes (Signed)
CRITICAL VALUE ALERT  Critical value received:  Lactic Acid 2.7  Date of notification:  11/20/2015  Time of notification:  2100  Critical value read back:Yes.    Nurse who received alert:  Stann Mainland  MD notified (1st page):  L. Harduk  Time of first page: 2350  MD notified (2nd page):  Time of second page:  Responding MD:  Roger Shelter  Time MD responded:  [phone]

## 2015-11-20 NOTE — ED Provider Notes (Signed)
CSN: [id number]     Arrival date & time 11/20/15  1225 History   First MD Initiated Contact with Patient 11/20/15 1227     Chief Complaint  Patient presents with  . Chest Pain     (Consider location/radiation/quality/duration/timing/severity/associated sxs/prior Treatment) HPI Comments: 80 year old male with history of high blood pressure, CAD, atrial fibrillation, melena, pneumonia, COPD presents with chest pain and cough. Patient's had a chronic cough however worsening with productive sputum recently as well as new chest discomfort unable to describe since this morning fairly constant. Radiation to left arm and shoulder. Patient unsure of his detailed medical history poor historian. No obvious exertional component. No recent surgeries, no pulmonary embolism history, no unilateral leg swelling. Currently mild symptoms.  Patient is a 81 y.o. male presenting with chest pain. The history is provided by the patient and medical records.  Chest Pain Associated symptoms: cough   Associated symptoms: no abdominal pain, no back pain, no fever, no headache, no shortness of breath and not vomiting     Past Medical History  Diagnosis Date  . Acute exacerbation of chronic bronchitis ([place])   . History of tuberculosis   . CAD (coronary artery disease)   . Hyperlipidemia   . BPH (benign prostatic hypertrophy)   . Thyroid disease     hyperthyroidism  . Diabetes mellitus without complication ([place])   . GERD (gastroesophageal reflux disease)   . Diverticulitis    Past Surgical History  Procedure Laterality Date  . Cholecystectomy    . Thyroidectomy     Family History  Problem Relation Age of Onset  . Heart attack Father   . Heart failure Mother    Social History  Substance Use Topics  . Smoking status: Former Smoker -- 0.75 packs/day for 50 years    Types: Cigarettes    Start date: 09/16/1944    Quit date: 10/17/1998  . Smokeless tobacco: Never Used  . Alcohol Use: No    Review of  Systems  Constitutional: Negative for fever and chills.  HENT: Negative for congestion.   Eyes: Negative for visual disturbance.  Respiratory: Positive for cough. Negative for shortness of breath.   Cardiovascular: Positive for chest pain. Negative for leg swelling.  Gastrointestinal: Negative for vomiting and abdominal pain.  Genitourinary: Negative for dysuria and flank pain.  Musculoskeletal: Negative for back pain, neck pain and neck stiffness.  Skin: Negative for rash.  Neurological: Negative for light-headedness and headaches.      Allergies  Levaquin  Home Medications   Prior to Admission medications   Medication Sig Start Date End Date Taking? Authorizing Provider  allopurinol (ZYLOPRIM) 300 MG tablet Take 300 mg by mouth daily.   Yes Historical Provider, MD  amLODipine (NORVASC) 5 MG tablet Take 1 tablet (5 mg total) by mouth daily. 09/16/14  Yes Arnoldo Lenis, MD  aspirin EC 81 MG tablet Take 81 mg by mouth daily.   Yes Historical Provider, MD  cholecalciferol (VITAMIN D) 1000 UNITS tablet Take 4,000 Units by mouth daily.   Yes Historical Provider, MD  clobetasol cream (TEMOVATE) 4.09 % Apply 1 application topically 2 (two) times daily as needed (Dry Skin).  07/21/12  Yes Historical Provider, MD  docusate sodium (COLACE) 100 MG capsule Take 300 mg by mouth 2 (two) times daily.   Yes Historical Provider, MD  ferrous fumarate (HEMOCYTE - 106 MG FE) 325 (106 FE) MG TABS tablet Take 1 tablet by mouth 2 (two) times daily.   Yes Historical Provider,  MD  finasteride (PROSCAR) 5 MG tablet Take 5 mg by mouth daily.     Yes Historical Provider, MD  folic acid (FOLVITE) 1 MG tablet Take 1 mg by mouth daily.   Yes Historical Provider, MD  galantamine (RAZADYNE) 8 MG tablet Take 8 mg by mouth 2 (two) times daily.     Yes Historical Provider, MD  glyBURIDE (DIABETA) 5 MG tablet Take 10 mg by mouth 2 (two) times daily with a meal. 2 in the morning and 1 tab at night   Yes Historical  Provider, MD  guaiFENesin 200 MG tablet Take 400 mg by mouth 2 (two) times daily.   Yes Historical Provider, MD  lisinopril (PRINIVIL,ZESTRIL) 20 MG tablet Take 10 mg by mouth daily.   Yes Historical Provider, MD  loratadine (CLARITIN) 10 MG tablet Take 10 mg by mouth daily.     Yes Historical Provider, MD  magnesium oxide (MAG-OX) 400 MG tablet Take 400 mg by mouth 2 (two) times daily.    Yes Historical Provider, MD  Multiple Vitamin (MULTIVITAMIN WITH MINERALS) TABS tablet Take 1 tablet by mouth daily.   Yes Historical Provider, MD  omeprazole (PRILOSEC) 20 MG capsule Take 20 mg by mouth 2 (two) times daily.     Yes Historical Provider, MD  oxybutynin (DITROPAN) 5 MG tablet Take 5 mg by mouth 2 (two) times daily.    Yes Historical Provider, MD  rosuvastatin (CRESTOR) 20 MG tablet Take 1 tablet (20 mg total) by mouth daily. 09/16/14  Yes Arnoldo Lenis, MD  warfarin (COUMADIN) 5 MG tablet Take 5 mg by mouth daily.   Yes Historical Provider, MD  Ascorbic Acid (VITAMIN C) 500 MG tablet Take 500 mg by mouth 2 (two) times daily.     Historical Provider, MD  diltiazem (DILACOR XR) 240 MG 24 hr capsule Take 240 mg by mouth daily.      Historical Provider, MD  metroNIDAZOLE (FLAGYL) 500 MG tablet Take 1 tablet by mouth 3 (three) times daily. Starting 11/07/2015 x 7 days. 11/07/15   Historical Provider, MD  ondansetron (ZOFRAN) 4 MG tablet Take 1 tablet by mouth every 4 (four) hours as needed. 11/07/15   Historical Provider, MD   BP 115/64 mmHg  Pulse 92  Temp(Src) 97.7 F (36.5 C) (Oral)  Resp 28  Ht 5' 9.5" (1.765 m)  Wt 188 lb (85.276 kg)  BMI 27.37 kg/m2  SpO2 93% Physical Exam  Constitutional: He is oriented to person, place, and time. He appears well-developed and well-nourished.  HENT:  Head: Normocephalic and atraumatic.  Eyes: Right eye exhibits no discharge. Left eye exhibits no discharge.  Neck: Normal range of motion. Neck supple. No tracheal deviation present.  Cardiovascular:  Normal rate and regular rhythm.   Pulmonary/Chest: Effort normal. No respiratory distress. He has rales (lower bilateral worse left mid and lower lobes).  Abdominal: Soft. He exhibits no distension. There is no tenderness. There is no guarding.  Musculoskeletal: He exhibits edema (minimal).  Neurological: He is alert and oriented to person, place, and time.  Skin: Skin is warm. No rash noted.  Psychiatric: He has a normal mood and affect.  Nursing note and vitals reviewed.   ED Course  Procedures (including critical care time) Labs Review Labs Reviewed  BASIC METABOLIC PANEL - Abnormal; Notable for the following:    Glucose, Bld 149 (*)    BUN 45 (*)    Calcium 8.7 (*)    All other components within normal limits  CBC WITH DIFFERENTIAL/PLATELET - Abnormal; Notable for the following:    WBC 25.1 (*)    RBC 3.22 (*)    Hemoglobin 9.5 (*)    HCT 29.9 (*)    Neutro Abs 22.6 (*)    All other components within normal limits  LACTIC ACID, PLASMA - Abnormal; Notable for the following:    Lactic Acid, Venous 2.8 (*)    All other components within normal limits  CULTURE, BLOOD (ROUTINE X 2)  CULTURE, BLOOD (ROUTINE X 2)  TROPONIN I  PROTIME-INR    Imaging Review No results found. I have personally reviewed and evaluated these images and lab results as part of my medical decision-making.   EKG Interpretation   Date/Time:  Sunday November 20 2015 12:33:12 EST Ventricular Rate:  94 PR Interval:  182 QRS Duration: 94 QT Interval:  382 QTC Calculation: 478 R Axis:   24 Text Interpretation:  Sinus rhythm Ventricular premature complex Abnormal  R-wave progression, early transition Probable inferior infarct, old  Confirmed by Chestina Komatsu  MD, Kimberli Winne ([id number]) on 11/20/2015 12:56:04 PM      MDM   Final diagnoses:  CAP (community acquired pneumonia)  Chronic obstructive pulmonary disease, unspecified COPD type ([place])   Patient presents with persistent chest discomfort, productive cough  since this morning, oxygen saturation 89% on arrival nasal cannula place. With coronary artery disease history, borderline oxygen plan to look for signs of pneumonia and cardiac screening. Plan for observation on telemetry versus admission.  Chest x-ray reviewed concerning for left lower lobe pneumonia. With white blood cell count elevation, productive cough, antibiotics IV given. With age and borderline oxygen plan for admission to the hospital. The patients results and plan were reviewed and discussed.   Any x-rays performed were independently reviewed by myself.   Differential diagnosis were considered with the presenting HPI.  Medications  cefTRIAXone (ROCEPHIN) 1 g in dextrose 5 % 50 mL IVPB (not administered)  azithromycin (ZITHROMAX) 500 mg in dextrose 5 % 250 mL IVPB (not administered)  sodium chloride 0.9 % bolus 1,000 mL (not administered)  ipratropium-albuterol (DUONEB) 0.5-2.5 (3) MG/3ML nebulizer solution 3 mL (3 mLs Nebulization Given 11/20/15 1315)    Filed Vitals:   11/20/15 1240 11/20/15 1316  BP: 115/64   Pulse: 92   Temp: 97.7 F (36.5 C)   TempSrc: Oral   Resp: 28   Height: 5' 9.5" (1.765 m)   Weight: 188 lb (85.276 kg)   SpO2: 94% 93%    Final diagnoses:  CAP (community acquired pneumonia)  Chronic obstructive pulmonary disease, unspecified COPD type ([place])    Admission/ observation were discussed with the admitting physician, patient and/or family and they are comfortable with the plan.    Elnora Morrison, MD 11/20/15 [phone]

## 2015-11-20 NOTE — H&P (Signed)
[facility] History and Physical  Caleb WYNE Sr. HYQ:[id number] DOB: 06-Nov-1935 DOA: 11/20/2015  Referring physician: Elnora Morrison, MD PCP: Caleb Bogus, MD   Chief Complaint: Chest Pain   HPI: Caleb TAL Sr. is a 80 y.o. male with a hx of HTN, CAD, atrial fibrillation, DM type 2, and COPD presents with chest pain that began this morning. His pain is located in his midsternal chest and radiates to his left arm and shoulder. It has been improving since onset. Taking a deep breath and coughing exacerbates his pain. He also reports a worsening chronic cough that is now productive of sputum as well as SOB, a subjective fever, sore throat, and congestion over the last few days. He denies any nausea, vomiting, urinary changes, or chills.  While in the ED, patient had an O2 sat of 89% on RA. He was placed on 2L [place] with improvement. Labs revealed a negative troponin, lactic acid of 2.8, WBC of 25.1 and a hgb of 9.5. CXR was significant for a LLL PNA. He was started on IV abx and referred for admission.    Review of Systems:  Constitutional:  Subjective fever  No weight loss, night sweats,  chills, fatigue.  HEENT:  Sore throat, congestion No headaches, Difficulty swallowing,Tooth/dental problems, No  itching, ear ache, post nasal drip,  Cardio-vascular:  chest pain No Orthopnea, PND, swelling in lower extremities, anasarca, dizziness, palpitations  GI:  No heartburn, indigestion, abdominal pain, nausea, vomiting, diarrhea, change in bowel habits, loss of appetite  Resp:  Productive cough, SOB No coughing up of blood.No change in color of mucus.No wheezing.No chest wall deformity  Skin:  no rash or lesions.  GU:  no dysuria, change in color of urine, no urgency or frequency. No flank pain.  Musculoskeletal:  No joint pain or swelling. No decreased range of motion. No back pain.  Psych:  No change in mood or affect. No depression or anxiety. No memory loss.   Past  Medical History  Diagnosis Date  . Acute exacerbation of chronic bronchitis ([name])   . History of tuberculosis   . CAD (coronary artery disease)   . Hyperlipidemia   . BPH (benign prostatic hypertrophy)   . Thyroid disease     hyperthyroidism  . Diabetes mellitus without complication ([place])   . GERD (gastroesophageal reflux disease)   . Diverticulitis    Past Surgical History  Procedure Laterality Date  . Cholecystectomy    . Thyroidectomy     Social History:  reports that he quit smoking about 17 years ago. His smoking use included Cigarettes. He started smoking about 71 years ago. He has a 37.5 pack-year smoking history. He has never used smokeless tobacco. He reports that he does not drink alcohol or use illicit drugs.  Allergies  Allergen Reactions  . Levaquin [Levofloxacin In D5w]     SWELLING    Family History  Problem Relation Age of Onset  . Heart attack Father   . Heart failure Mother     Prior to Admission medications   Medication Sig Start Date End Date Taking? Authorizing Provider  allopurinol (ZYLOPRIM) 300 MG tablet Take 300 mg by mouth daily.   Yes Historical Provider, MD  amLODipine (NORVASC) 5 MG tablet Take 1 tablet (5 mg total) by mouth daily. 09/16/14  Yes Arnoldo Lenis, MD  Ascorbic Acid (VITAMIN C) 500 MG tablet Take 500 mg by mouth 2 (two) times daily.    Yes Historical Provider, MD  aspirin  EC 81 MG tablet Take 81 mg by mouth daily.   Yes Historical Provider, MD  cholecalciferol (VITAMIN D) 1000 UNITS tablet Take 4,000 Units by mouth daily.   Yes Historical Provider, MD  clobetasol cream (TEMOVATE) 6.96 % Apply 1 application topically 2 (two) times daily as needed (Dry Skin).  07/21/12  Yes Historical Provider, MD  diltiazem (DILACOR XR) 240 MG 24 hr capsule Take 240 mg by mouth daily.     Yes Historical Provider, MD  docusate sodium (COLACE) 100 MG capsule Take 300 mg by mouth 2 (two) times daily.   Yes Historical Provider, MD  ferrous fumarate  (HEMOCYTE - 106 MG FE) 325 (106 FE) MG TABS tablet Take 1 tablet by mouth 2 (two) times daily.   Yes Historical Provider, MD  finasteride (PROSCAR) 5 MG tablet Take 5 mg by mouth daily.     Yes Historical Provider, MD  folic acid (FOLVITE) 1 MG tablet Take 1 mg by mouth daily.   Yes Historical Provider, MD  galantamine (RAZADYNE) 8 MG tablet Take 8 mg by mouth 2 (two) times daily.     Yes Historical Provider, MD  glyBURIDE (DIABETA) 5 MG tablet Take 5-10 mg by mouth 2 (two) times daily with a meal. 2 in the morning and 1 tab at night   Yes Historical Provider, MD  guaiFENesin 200 MG tablet Take 400 mg by mouth 2 (two) times daily.   Yes Historical Provider, MD  Insulin Glargine (LANTUS SOLOSTAR) 100 UNIT/ML Solostar Pen Inject 30 Units into the skin daily at 10 pm.   Yes Historical Provider, MD  levothyroxine (SYNTHROID, LEVOTHROID) 112 MCG tablet Take 124 mcg by mouth at bedtime.   Yes Historical Provider, MD  lisinopril (PRINIVIL,ZESTRIL) 20 MG tablet Take 10 mg by mouth daily.   Yes Historical Provider, MD  loratadine (CLARITIN) 10 MG tablet Take 10 mg by mouth daily.     Yes Historical Provider, MD  magnesium oxide (MAG-OX) 400 MG tablet Take 400 mg by mouth 2 (two) times daily.    Yes Historical Provider, MD  Multiple Vitamin (MULTIVITAMIN WITH MINERALS) TABS tablet Take 1 tablet by mouth daily.   Yes Historical Provider, MD  omeprazole (PRILOSEC) 20 MG capsule Take 20 mg by mouth 2 (two) times daily.     Yes Historical Provider, MD  ondansetron (ZOFRAN) 4 MG tablet Take 1 tablet by mouth every 4 (four) hours as needed for nausea or vomiting.  11/07/15  Yes Historical Provider, MD  oxybutynin (DITROPAN) 5 MG tablet Take 5 mg by mouth 2 (two) times daily.    Yes Historical Provider, MD  rosuvastatin (CRESTOR) 20 MG tablet Take 1 tablet (20 mg total) by mouth daily. 09/16/14  Yes Arnoldo Lenis, MD  warfarin (COUMADIN) 5 MG tablet Take 5 mg by mouth at bedtime.    Yes Historical Provider, MD    metroNIDAZOLE (FLAGYL) 500 MG tablet Take 1 tablet by mouth 3 (three) times daily. Starting 11/07/2015 x 7 days. 11/07/15   Historical Provider, MD   Physical Exam: Filed Vitals:   11/20/15 1240 11/20/15 1316  BP: 115/64   Pulse: 92   Temp: 97.7 F (36.5 C)   TempSrc: Oral   Resp: 28   Height: 5' 9.5" (1.765 m)   Weight: 85.276 kg (188 lb)   SpO2: 94% 93%    Wt Readings from Last 3 Encounters:  11/20/15 85.276 kg (188 lb)  09/16/14 97.07 kg (214 lb)  06/28/14 95.2 kg (209 lb 14.1  oz)    General:  Appears calm and comfortable Eyes: PERRL, normal lids, irises & conjunctiva ENT: grossly normal hearing, lips & tongue Neck: no LAD, masses or thyromegaly Cardiovascular:   RRR, no m/r/g. No LE edema. Telemetry: SR, no arrhythmias  Respiratory: Coarse breath sounds at bases bilaterally. Normal respiratory effort. Abdomen: soft, mild tenderness diffusely. Skin: no rash or induration seen on limited exam Musculoskeletal: grossly normal tone BUE/BLE. TTP over left chest. Psychiatric: grossly normal mood and affect, speech fluent and appropriate Neurologic: grossly non-focal.          Labs on Admission:  Basic Metabolic Panel:  Recent Labs Lab 11/20/15 1249  NA 138  K 4.4  CL 104  CO2 23  GLUCOSE 149*  BUN 45*  CREATININE 0.71  CALCIUM 8.7*   CBC:  Recent Labs Lab 11/20/15 1249  WBC 25.1*  NEUTROABS 22.6*  HGB 9.5*  HCT 29.9*  MCV 92.9  PLT 302   Cardiac Enzymes:  Recent Labs Lab 11/20/15 1249  TROPONINI <0.03   EKG: Independently reviewed. No acute changes.   Assessment/Plan Active Problems:   CAP (community acquired pneumonia)   1. CAP in the LLL. He is afebrile. WBC 25.1, lactic acid 2.8. Will continue IVF,  IV abx, and nebulizers. BC ordered. 2. Sepsis, related to PNA. Continue IV hydration per sepsis protocol. Follow up lactic acid. Abx as above.  3. Acute respiratory failure with hypoxia. Related to PNA in the setting of COPD. Wean off O2 as  tolerated. 4. Chest pain, likely pleuritic. EKG is reassuring, troponin is negative. Will cycle troponin and monitor. He is already anticoagulated.  5. DM type 2, stable. Start SSI and continue lantus.  6. COPD, without evidence of exacerbation. Continue bronchodilators 7. Paroxysmal atrial fibrillation, rate controlled. Currently in SR. Will hold Diltiazem for now until BP stabilized. Continue anticoagulation per pharmacy. 8. HLD , continue statin. 9. Essential HTN, BP soft. Hold antihypertensives for now.    Code Status: Full DVT Prophylaxis: Warfarin Family Communication: Family at bedside. Discussed with patient who understands and has no concerns at this time. Disposition Plan: Admit to telemetry.  Time spent: 60 minutes  [employer]. MD [facility] Pager [phone]   By signing my name below, I, Rosalie Doctor, attest that this documentation has been prepared under the direction and in the presence of [facility]. MD Electronically Signed: Rosalie Doctor, Scribe.  11/20/2015  I, Dr. Kathie Dike, personally performed the services described in this documentaiton. All medical record entries made by the scribe were at my direction and in my presence. I have reviewed the chart and agree that the record reflects my personal performance and is accurate and complete  Kathie Dike, MD, 11/20/2015 2:54 PM

## 2015-11-20 NOTE — Progress Notes (Signed)
[facility] for Renal Adjustment of ABX if needed  Allergies  Allergen Reactions  . Levaquin [Levofloxacin In D5w]     SWELLING   Patient Measurements: Height: 5' 9.5" (176.5 cm) Weight: 188 lb (85.276 kg) IBW/kg (Calculated) : 71.85  Vital Signs: Temp: 98.1 F (36.7 C) (12/04 1518) Temp Source: Oral (12/04 1240) BP: 100/55 mmHg (12/04 1518) Pulse Rate: 102 (12/04 1518)  Labs:  Recent Labs  11/20/15 1249  WBC 25.1*  HGB 9.5*  PLT 302  CREATININE 0.71    No results for input(s): VANCOTROUGH, VANCOPEAK, VANCORANDOM, GENTTROUGH, GENTPEAK, GENTRANDOM, TOBRATROUGH, TOBRAPEAK, TOBRARND, AMIKACINPEAK, AMIKACINTROU, AMIKACIN in the last 72 hours.   Medical History: Past Medical History  Diagnosis Date  . Acute exacerbation of chronic bronchitis ([place])   . History of tuberculosis   . CAD (coronary artery disease)   . Hyperlipidemia   . BPH (benign prostatic hypertrophy)   . Thyroid disease     hyperthyroidism  . Diabetes mellitus without complication ([place])   . GERD (gastroesophageal reflux disease)   . Diverticulitis    Assessment: Pt has been started on Zithromax and Rocephin Renal adjustment is not needed.  Estimated Creatinine Clearance: 77.4 mL/min (by C-G formula based on Cr of 0.71).  Plan: Continue current Rx  Ena Dawley, [facility] 11/20/2015,6:26 PM

## 2015-11-21 LAB — MRSA PCR SCREENING: MRSA BY PCR: POSITIVE — AB

## 2015-11-21 LAB — EXPECTORATED SPUTUM ASSESSMENT W GRAM STAIN, RFLX TO RESP C

## 2015-11-21 LAB — GLUCOSE, CAPILLARY
GLUCOSE-CAPILLARY: 116 mg/dL — AB (ref 65–99)
GLUCOSE-CAPILLARY: 240 mg/dL — AB (ref 65–99)
GLUCOSE-CAPILLARY: 200 mg/dL — AB (ref 65–99)
Glucose-Capillary: 159 mg/dL — ABNORMAL HIGH (ref 65–99)

## 2015-11-21 LAB — EXPECTORATED SPUTUM ASSESSMENT W REFEX TO RESP CULTURE

## 2015-11-21 LAB — PROTIME-INR
INR: 2.91 — ABNORMAL HIGH (ref 0.00–1.49)
PROTHROMBIN TIME: 29.9 s — AB (ref 11.6–15.2)

## 2015-11-21 LAB — LACTIC ACID, PLASMA: LACTIC ACID, VENOUS: 2.1 mmol/L — AB (ref 0.5–2.0)

## 2015-11-21 LAB — STREP PNEUMONIAE URINARY ANTIGEN: Strep Pneumo Urinary Antigen: NEGATIVE

## 2015-11-21 MED ORDER — MUPIROCIN 2 % EX OINT
1.0000 "application " | TOPICAL_OINTMENT | Freq: Two times a day (BID) | CUTANEOUS | Status: AC
  Administered 2015-11-21 – 2015-11-26 (×10): 1 via NASAL
  Filled 2015-11-21 (×2): qty 22

## 2015-11-21 MED ORDER — GUAIFENESIN ER 600 MG PO TB12
600.0000 mg | ORAL_TABLET | Freq: Two times a day (BID) | ORAL | Status: DC
Start: 2015-11-21 — End: 2015-11-26
  Administered 2015-11-21 – 2015-11-26 (×10): 600 mg via ORAL
  Filled 2015-11-21 (×10): qty 1

## 2015-11-21 MED ORDER — DEXTROSE 5 % IV SOLN
INTRAVENOUS | Status: AC
  Filled 2015-11-21: qty 500

## 2015-11-21 MED ORDER — METHYLPREDNISOLONE SODIUM SUCC 40 MG IJ SOLR
40.0000 mg | Freq: Two times a day (BID) | INTRAMUSCULAR | Status: DC
  Administered 2015-11-21 – 2015-11-25 (×9): 40 mg via INTRAVENOUS
  Filled 2015-11-21 (×9): qty 1

## 2015-11-21 MED ORDER — WARFARIN - PHARMACIST DOSING INPATIENT: Status: DC

## 2015-11-21 MED ORDER — LEVOTHYROXINE SODIUM 25 MCG PO TABS
125.0000 ug | ORAL_TABLET | Freq: Every day | ORAL | Status: DC
  Administered 2015-11-21 – 2015-11-26 (×6): 125 ug via ORAL
  Filled 2015-11-21 (×6): qty 1

## 2015-11-21 MED ORDER — CHLORHEXIDINE GLUCONATE CLOTH 2 % EX PADS
6.0000 | MEDICATED_PAD | Freq: Every day | CUTANEOUS | Status: DC
  Administered 2015-11-22 – 2015-11-25 (×4): 6 via TOPICAL

## 2015-11-21 MED ORDER — MAGNESIUM OXIDE 400 (241.3 MG) MG PO TABS
400.0000 mg | ORAL_TABLET | Freq: Two times a day (BID) | ORAL | Status: DC
  Administered 2015-11-21 – 2015-11-26 (×10): 400 mg via ORAL
  Filled 2015-11-21 (×10): qty 1

## 2015-11-21 MED ORDER — SODIUM CHLORIDE 0.9 % IV SOLN
INTRAVENOUS | Status: DC
  Administered 2015-11-21 – 2015-11-26 (×6): via INTRAVENOUS

## 2015-11-21 MED ORDER — CLOBETASOL PROPIONATE 0.05 % EX OINT
TOPICAL_OINTMENT | Freq: Two times a day (BID) | CUTANEOUS | Status: DC
  Administered 2015-11-22 – 2015-11-26 (×10): via TOPICAL
  Filled 2015-11-21: qty 15

## 2015-11-21 MED ORDER — NITROGLYCERIN 0.4 MG SL SUBL: 0.4000 mg | SUBLINGUAL_TABLET | SUBLINGUAL | Status: DC | PRN

## 2015-11-21 MED ORDER — TRAMADOL HCL 50 MG PO TABS
50.0000 mg | ORAL_TABLET | ORAL | Status: DC | PRN
  Administered 2015-11-21 – 2015-11-26 (×2): 50 mg via ORAL
  Filled 2015-11-21 (×2): qty 1

## 2015-11-21 NOTE — Progress Notes (Signed)
Subjective: He was admitted yesterday with pneumonia. He says he feels better. He could not walk he says. His second lactic acid level was elevated. This will need to be repeated. He says he feels better but is still coughing up a lot of sputum  Objective: Vital signs in last 24 hours: Temp:  [97.7 F (36.5 C)-98.1 F (36.7 C)] 98 F (36.7 C) (12/05 0653) Pulse Rate:  [92-104] 96 (12/05 0653) Resp:  [20-28] 20 (12/05 0653) BP: (94-120)/(53-64) 106/59 mmHg (12/05 0653) SpO2:  [92 %-96 %] 95 % (12/05 0751) Weight:  [85.276 kg (188 lb)] 85.276 kg (188 lb) (12/04 1518) Weight change:  Last BM Date: 11/19/15  Intake/Output from previous day: 12/04 0701 - 12/05 0700 In: -  Out: 500 [Urine:500]  PHYSICAL EXAM General appearance: alert, cooperative and mild distress Resp: Rhonchi and wheezes bilaterally Cardio: irregularly irregular rhythm GI: soft, non-tender; bowel sounds normal; no masses,  no organomegaly Extremities: extremities normal, atraumatic, no cyanosis or edema  Lab Results:  Results for orders placed or performed during the hospital encounter of 11/20/15 (from the past 48 hour(s))  Basic metabolic panel     Status: Abnormal   Collection Time: 11/20/15 12:49 PM  Result Value Ref Range   Sodium 138 135 - 145 mmol/L   Potassium 4.4 3.5 - 5.1 mmol/L   Chloride 104 101 - 111 mmol/L   CO2 23 22 - 32 mmol/L   Glucose, Bld 149 (H) 65 - 99 mg/dL   BUN 45 (H) 6 - 20 mg/dL   Creatinine, Ser 0.71 0.61 - 1.24 mg/dL   Calcium 8.7 (L) 8.9 - 10.3 mg/dL   GFR calc non Af Amer >60 >60 mL/min   GFR calc Af Amer >60 >60 mL/min    Comment: (NOTE) The eGFR has been calculated using the CKD EPI equation. This calculation has not been validated in all clinical situations. eGFR's persistently <60 mL/min signify possible Chronic Kidney Disease.    Anion gap 11 5 - 15  CBC with Differential/Platelet     Status: Abnormal   Collection Time: 11/20/15 12:49 PM  Result Value Ref Range    WBC 25.1 (H) 4.0 - 10.5 K/uL   RBC 3.22 (L) 4.22 - 5.81 MIL/uL   Hemoglobin 9.5 (L) 13.0 - 17.0 g/dL   HCT 29.9 (L) 39.0 - 52.0 %   MCV 92.9 78.0 - 100.0 fL   MCH 29.5 26.0 - 34.0 pg   MCHC 31.8 30.0 - 36.0 g/dL   RDW 15.5 11.5 - 15.5 %   Platelets 302 150 - 400 K/uL   Neutrophils Relative % 90 %   Lymphocytes Relative 6 %   Monocytes Relative 4 %   Eosinophils Relative 0 %   Basophils Relative 0 %   Neutro Abs 22.6 (H) 1.7 - 7.7 K/uL   Lymphs Abs 1.5 0.7 - 4.0 K/uL   Monocytes Absolute 1.0 0.1 - 1.0 K/uL   Eosinophils Absolute 0.0 0.0 - 0.7 K/uL   Basophils Absolute 0.0 0.0 - 0.1 K/uL  Lactic acid, plasma     Status: Abnormal   Collection Time: 11/20/15 12:49 PM  Result Value Ref Range   Lactic Acid, Venous 2.8 (HH) 0.5 - 2.0 mmol/L    Comment: CRITICAL RESULT CALLED TO, READ BACK BY AND VERIFIED WITH: CRUISE, A AT 1321 ON 11/20/2015 BY WOODS, M   Troponin I     Status: None   Collection Time: 11/20/15 12:49 PM  Result Value Ref Range   Troponin  I <0.03 <0.031 ng/mL    Comment:        NO INDICATION OF MYOCARDIAL INJURY.   Blood culture (routine x 2)     Status: None (Preliminary result)   Collection Time: 11/20/15  1:26 PM  Result Value Ref Range   Specimen Description BLOOD LEFT ANTECUBITAL    Special Requests BOTTLES DRAWN AEROBIC AND ANAEROBIC 4CC    Culture PENDING    Report Status PENDING   Protime-INR     Status: Abnormal   Collection Time: 11/20/15  1:30 PM  Result Value Ref Range   Prothrombin Time 40.1 (H) 11.6 - 15.2 seconds   INR 4.29 (H) 0.00 - 1.49  Blood culture (routine x 2)     Status: None (Preliminary result)   Collection Time: 11/20/15  1:40 PM  Result Value Ref Range   Specimen Description BLOOD BLOOD LEFT HAND    Special Requests BOTTLES DRAWN AEROBIC AND ANAEROBIC 4CC    Culture PENDING    Report Status PENDING   Lactic acid, plasma     Status: None   Collection Time: 11/20/15  3:39 PM  Result Value Ref Range   Lactic Acid, Venous 1.8 0.5  - 2.0 mmol/L  Procalcitonin     Status: None   Collection Time: 11/20/15  3:39 PM  Result Value Ref Range   Procalcitonin <0.10 ng/mL    Comment:        Interpretation: PCT (Procalcitonin) <= 0.5 ng/mL: Systemic infection (sepsis) is not likely. Local bacterial infection is possible. (NOTE)         ICU PCT Algorithm               Non ICU PCT Algorithm    ----------------------------     ------------------------------         PCT < 0.25 ng/mL                 PCT < 0.1 ng/mL     Stopping of antibiotics            Stopping of antibiotics       strongly encouraged.               strongly encouraged.    ----------------------------     ------------------------------       PCT level decrease by               PCT < 0.25 ng/mL       >= 80% from peak PCT       OR PCT 0.25 - 0.5 ng/mL          Stopping of antibiotics                                             encouraged.     Stopping of antibiotics           encouraged.    ----------------------------     ------------------------------       PCT level decrease by              PCT >= 0.25 ng/mL       < 80% from peak PCT        AND PCT >= 0.5 ng/mL            Continuin g antibiotics  encouraged.       Continuing antibiotics            encouraged.    ----------------------------     ------------------------------     PCT level increase compared          PCT > 0.5 ng/mL         with peak PCT AND          PCT >= 0.5 ng/mL             Escalation of antibiotics                                          strongly encouraged.      Escalation of antibiotics        strongly encouraged.   Troponin I     Status: None   Collection Time: 11/20/15  3:39 PM  Result Value Ref Range   Troponin I <0.03 <0.031 ng/mL    Comment:        NO INDICATION OF MYOCARDIAL INJURY.   Glucose, capillary     Status: Abnormal   Collection Time: 11/20/15  4:48 PM  Result Value Ref Range   Glucose-Capillary 136 (H) 65 -  99 mg/dL   Comment 1 Notify RN   Lactic acid, plasma     Status: Abnormal   Collection Time: 11/20/15  8:09 PM  Result Value Ref Range   Lactic Acid, Venous 2.7 (HH) 0.5 - 2.0 mmol/L    Comment: CRITICAL RESULT CALLED TO, READ BACK BY AND VERIFIED WITH: MARTIN,M AT 2135 ON 11/20/2015 BY AGUNDIZ, E.   Glucose, capillary     Status: Abnormal   Collection Time: 11/20/15  9:41 PM  Result Value Ref Range   Glucose-Capillary 151 (H) 65 - 99 mg/dL   Comment 1 Notify RN    Comment 2 Document in Chart   Protime-INR     Status: Abnormal   Collection Time: 11/21/15  7:29 AM  Result Value Ref Range   Prothrombin Time 29.9 (H) 11.6 - 15.2 seconds   INR 2.91 (H) 0.00 - 1.49  Glucose, capillary     Status: Abnormal   Collection Time: 11/21/15  7:34 AM  Result Value Ref Range   Glucose-Capillary 159 (H) 65 - 99 mg/dL    ABGS No results for input(s): PHART, PO2ART, TCO2, HCO3 in the last 72 hours.  Invalid input(s): PCO2 CULTURES Recent Results (from the past 240 hour(s))  Blood culture (routine x 2)     Status: None (Preliminary result)   Collection Time: 11/20/15  1:26 PM  Result Value Ref Range Status   Specimen Description BLOOD LEFT ANTECUBITAL  Final   Special Requests BOTTLES DRAWN AEROBIC AND ANAEROBIC 4CC  Final   Culture PENDING  Incomplete   Report Status PENDING  Incomplete  Blood culture (routine x 2)     Status: None (Preliminary result)   Collection Time: 11/20/15  1:40 PM  Result Value Ref Range Status   Specimen Description BLOOD BLOOD LEFT HAND  Final   Special Requests BOTTLES DRAWN AEROBIC AND ANAEROBIC 4CC  Final   Culture PENDING  Incomplete   Report Status PENDING  Incomplete   Studies/Results: Dg Chest 2 View  11/20/2015  CLINICAL DATA:  Midsternal chest pain radiating into the left arm. History of diabetes and coronary artery disease. EXAM: CHEST  2 VIEW COMPARISON:  CT  06/27/2014.  Radiographs 06/27/2014. FINDINGS: The heart size and mediastinal contours are  stable with aortic atherosclerosis. There is chronic lung disease with central airway thickening and peribronchovascular nodularity. There are increased patchy opacities at both lung bases compared with the most recent studies, but no consolidation or significant pleural effusion. The bones appear unchanged. IMPRESSION: Mildly progressive patchy airspace opacities at both lung bases compared with recent prior studies. These findings are nonspecific and may reflect atelectasis or chronic inflammation. Other sequela of chronic granulomatous disease appear unchanged. Electronically Signed   By: Richardean Sale M.D.   On: 11/20/2015 14:37    Medications:  Prior to Admission:  Prescriptions prior to admission  Medication Sig Dispense Refill Last Dose  . allopurinol (ZYLOPRIM) 300 MG tablet Take 300 mg by mouth daily.   11/20/2015 at Unknown time  . amLODipine (NORVASC) 5 MG tablet Take 1 tablet (5 mg total) by mouth daily. 180 tablet 3 11/20/2015 at Unknown time  . Ascorbic Acid (VITAMIN C) 500 MG tablet Take 500 mg by mouth 2 (two) times daily.    11/20/2015 at Unknown time  . aspirin EC 81 MG tablet Take 81 mg by mouth daily.   11/20/2015 at Unknown time  . cholecalciferol (VITAMIN D) 1000 UNITS tablet Take 4,000 Units by mouth daily.   11/20/2015 at Unknown time  . clobetasol cream (TEMOVATE) 3.76 % Apply 1 application topically 2 (two) times daily as needed (Dry Skin).    Past Week at Unknown time  . diltiazem (DILACOR XR) 240 MG 24 hr capsule Take 240 mg by mouth daily.     11/20/2015 at Unknown time  . docusate sodium (COLACE) 100 MG capsule Take 300 mg by mouth 2 (two) times daily.   11/20/2015 at Unknown time  . ferrous fumarate (HEMOCYTE - 106 MG FE) 325 (106 FE) MG TABS tablet Take 1 tablet by mouth 2 (two) times daily.   11/20/2015 at Unknown time  . finasteride (PROSCAR) 5 MG tablet Take 5 mg by mouth daily.     11/20/2015 at Unknown time  . folic acid (FOLVITE) 1 MG tablet Take 1 mg by mouth daily.    11/20/2015 at Unknown time  . galantamine (RAZADYNE) 8 MG tablet Take 8 mg by mouth 2 (two) times daily.     11/20/2015 at Unknown time  . glyBURIDE (DIABETA) 5 MG tablet Take 5-10 mg by mouth 2 (two) times daily with a meal. 2 in the morning and 1 tab at night   11/20/2015 at Unknown time  . guaiFENesin 200 MG tablet Take 400 mg by mouth 2 (two) times daily.   11/20/2015 at Unknown time  . Insulin Glargine (LANTUS SOLOSTAR) 100 UNIT/ML Solostar Pen Inject 30 Units into the skin daily at 10 pm.   11/19/2015 at Unknown time  . levothyroxine (SYNTHROID, LEVOTHROID) 112 MCG tablet Take 124 mcg by mouth at bedtime.   11/19/2015 at Unknown time  . lisinopril (PRINIVIL,ZESTRIL) 20 MG tablet Take 10 mg by mouth daily.   11/20/2015 at Unknown time  . loratadine (CLARITIN) 10 MG tablet Take 10 mg by mouth daily.     11/20/2015 at Unknown time  . magnesium oxide (MAG-OX) 400 MG tablet Take 400 mg by mouth 2 (two) times daily.    11/20/2015 at Unknown time  . Multiple Vitamin (MULTIVITAMIN WITH MINERALS) TABS tablet Take 1 tablet by mouth daily.   11/20/2015 at Unknown time  . omeprazole (PRILOSEC) 20 MG capsule Take 20 mg by mouth 2 (two)  times daily.     11/20/2015 at Unknown time  . ondansetron (ZOFRAN) 4 MG tablet Take 1 tablet by mouth every 4 (four) hours as needed for nausea or vomiting.    11/20/2015 at Unknown time  . oxybutynin (DITROPAN) 5 MG tablet Take 5 mg by mouth 2 (two) times daily.    11/20/2015 at Unknown time  . rosuvastatin (CRESTOR) 20 MG tablet Take 1 tablet (20 mg total) by mouth daily. 90 tablet 3 11/20/2015 at Unknown time  . warfarin (COUMADIN) 5 MG tablet Take 5 mg by mouth at bedtime.    11/19/2015 at 1800  . [DISCONTINUED] metroNIDAZOLE (FLAGYL) 500 MG tablet Take 1 tablet by mouth 3 (three) times daily. Starting 11/07/2015 x 7 days.   Completed Course at Unknown time   Scheduled: . allopurinol  300 mg Oral Daily  . aspirin EC  81 mg Oral Daily  . azithromycin (ZITHROMAX) 500 MG IVPB  500 mg  Intravenous Once  . azithromycin  500 mg Intravenous Q24H  . cefTRIAXone (ROCEPHIN)  IV  1 g Intravenous Q24H  . docusate sodium  300 mg Oral BID  . finasteride  5 mg Oral Daily  . folic acid  1 mg Oral Daily  . galantamine  8 mg Oral BID  . guaiFENesin  600 mg Oral BID  . Influenza vac split quadrivalent PF  0.5 mL Intramuscular Tomorrow-1000  . insulin aspart  0-15 Units Subcutaneous TID WC  . insulin aspart  0-5 Units Subcutaneous QHS  . insulin glargine  30 Units Subcutaneous Q2200  . ipratropium-albuterol  3 mL Nebulization Q4H  . levothyroxine  125 mcg Oral QAC breakfast  . loratadine  10 mg Oral Daily  . magnesium oxide  400 mg Oral BID  . methylPREDNISolone (SOLU-MEDROL) injection  40 mg Intravenous Q12H  . oxybutynin  5 mg Oral BID  . pantoprazole  40 mg Oral BID AC  . rosuvastatin  20 mg Oral Daily  . vitamin C  500 mg Oral BID   Continuous: . sodium chloride 100 mL/hr at 11/21/15 0035   PRN:  Assesment: He was admitted with community-acquired pneumonia. He appeared to be septic on admission and his lactate level was elevated. He is better and I will have him get another lactate this morning. He has COPD at baseline. He has diabetes at baseline. I have added steroids which may give him more trouble with his blood sugar. He has chronic atrial fibrillation and is chronically anticoagulated. Active Problems:   CAP (community acquired pneumonia)   Sepsis ([place])    Plan: Continue with current treatments. Recheck lactate repeat laboratory work in the morning    LOS: 1 day   Nykiah Ma L 11/21/2015, 9:11 AM

## 2015-11-21 NOTE — Progress Notes (Signed)
ANTICOAGULATION CONSULT NOTE - Follow Up Consult  Pharmacy Consult for Coumadin (chronic Rx PTA) Indication: atrial fibrillation  Allergies  Allergen Reactions  . Levaquin [Levofloxacin In D5w]     SWELLING   Patient Measurements: Height: 5' 9.5" (176.5 cm) Weight: 188 lb (85.276 kg) IBW/kg (Calculated) : 71.85  Vital Signs: Temp: 98 F (36.7 C) (12/05 0653) Temp Source: Oral (12/05 0653) BP: 106/59 mmHg (12/05 0653) Pulse Rate: 96 (12/05 0653)  Labs:  Recent Labs  11/20/15 1249 11/20/15 1330 11/20/15 1539 11/21/15 0729  HGB 9.5*  --   --   --   HCT 29.9*  --   --   --   PLT 302  --   --   --   LABPROT  --  40.1*  --  29.9*  INR  --  4.29*  --  2.91*  CREATININE 0.71  --   --   --   TROPONINI <0.03  --  <0.03  --    Estimated Creatinine Clearance: 77.4 mL/min (by C-G formula based on Cr of 0.71).  Medications:  Prescriptions prior to admission  Medication Sig Dispense Refill Last Dose  . allopurinol (ZYLOPRIM) 300 MG tablet Take 300 mg by mouth daily.   11/20/2015 at Unknown time  . amLODipine (NORVASC) 5 MG tablet Take 1 tablet (5 mg total) by mouth daily. 180 tablet 3 11/20/2015 at Unknown time  . Ascorbic Acid (VITAMIN C) 500 MG tablet Take 500 mg by mouth 2 (two) times daily.    11/20/2015 at Unknown time  . aspirin EC 81 MG tablet Take 81 mg by mouth daily.   11/20/2015 at Unknown time  . cholecalciferol (VITAMIN D) 1000 UNITS tablet Take 4,000 Units by mouth daily.   11/20/2015 at Unknown time  . clobetasol cream (TEMOVATE) 1.75 % Apply 1 application topically 2 (two) times daily as needed (Dry Skin).    Past Week at Unknown time  . diltiazem (DILACOR XR) 240 MG 24 hr capsule Take 240 mg by mouth daily.     11/20/2015 at Unknown time  . docusate sodium (COLACE) 100 MG capsule Take 300 mg by mouth 2 (two) times daily.   11/20/2015 at Unknown time  . ferrous fumarate (HEMOCYTE - 106 MG FE) 325 (106 FE) MG TABS tablet Take 1 tablet by mouth 2 (two) times daily.    11/20/2015 at Unknown time  . finasteride (PROSCAR) 5 MG tablet Take 5 mg by mouth daily.     11/20/2015 at Unknown time  . folic acid (FOLVITE) 1 MG tablet Take 1 mg by mouth daily.   11/20/2015 at Unknown time  . galantamine (RAZADYNE) 8 MG tablet Take 8 mg by mouth 2 (two) times daily.     11/20/2015 at Unknown time  . glyBURIDE (DIABETA) 5 MG tablet Take 5-10 mg by mouth 2 (two) times daily with a meal. 2 in the morning and 1 tab at night   11/20/2015 at Unknown time  . guaiFENesin 200 MG tablet Take 400 mg by mouth 2 (two) times daily.   11/20/2015 at Unknown time  . Insulin Glargine (LANTUS SOLOSTAR) 100 UNIT/ML Solostar Pen Inject 30 Units into the skin daily at 10 pm.   11/19/2015 at Unknown time  . levothyroxine (SYNTHROID, LEVOTHROID) 112 MCG tablet Take 124 mcg by mouth at bedtime.   11/19/2015 at Unknown time  . lisinopril (PRINIVIL,ZESTRIL) 20 MG tablet Take 10 mg by mouth daily.   11/20/2015 at Unknown time  . loratadine (CLARITIN) 10  MG tablet Take 10 mg by mouth daily.     11/20/2015 at Unknown time  . magnesium oxide (MAG-OX) 400 MG tablet Take 400 mg by mouth 2 (two) times daily.    11/20/2015 at Unknown time  . Multiple Vitamin (MULTIVITAMIN WITH MINERALS) TABS tablet Take 1 tablet by mouth daily.   11/20/2015 at Unknown time  . omeprazole (PRILOSEC) 20 MG capsule Take 20 mg by mouth 2 (two) times daily.     11/20/2015 at Unknown time  . ondansetron (ZOFRAN) 4 MG tablet Take 1 tablet by mouth every 4 (four) hours as needed for nausea or vomiting.    11/20/2015 at Unknown time  . oxybutynin (DITROPAN) 5 MG tablet Take 5 mg by mouth 2 (two) times daily.    11/20/2015 at Unknown time  . rosuvastatin (CRESTOR) 20 MG tablet Take 1 tablet (20 mg total) by mouth daily. 90 tablet 3 11/20/2015 at Unknown time  . warfarin (COUMADIN) 5 MG tablet Take 5 mg by mouth at bedtime.    11/19/2015 at 1800  . [DISCONTINUED] metroNIDAZOLE (FLAGYL) 500 MG tablet Take 1 tablet by mouth 3 (three) times daily. Starting  11/07/2015 x 7 days.   Completed Course at Unknown time   Assessment: 80yo male on chronic Coumadin PTA for h/o afib.  INR was SUPRAtherapeutic on admission and Coumadin was held.  INR is now at target.   Goal of Therapy:  INR 2-3 Monitor platelets by anticoagulation protocol: Yes   Plan:  Coumadin '5mg'$  po today x 1 INR daily Monitor for s/sx of bleeding  Hart Robinsons A 11/21/2015,11:09 AM

## 2015-11-21 NOTE — Progress Notes (Signed)
Faxed Dr. Luan Pulling the results from the patients Lactic Acid level.  2.1 the level is decreasing.

## 2015-11-21 NOTE — Care Management Note (Signed)
Case Management Note  Patient Details  Name: Caleb TANDY Sr. MRN: [id number] Date of Birth: April 09, 1935  Subjective/Objective:                  Pt admitted from home with pneumonia. Pt lives with his wife and will return home at discharge. Pt is independent with ADL's. Pt does have a cane for prn use.  Action/Plan: No CM needs anticipated. [place] called and notified of pts admission. Will fax H&P and demographics to [facility]. Pts PCP is Dr. Royce Macadamia of the [facility].  Expected Discharge Date:                  Expected Discharge Plan:  Home/Self Care  In-House Referral:  NA  Discharge planning Services  CM Consult  Post Acute Care Choice:  NA Choice offered to:  NA  DME Arranged:    DME Agency:     HH Arranged:    HH Agency:     Status of Service:  Completed, signed off  Medicare Important Message Given:    Date Medicare IM Given:    Medicare IM give by:    Date Additional Medicare IM Given:    Additional Medicare Important Message give by:     If discussed at [place] of Stay Meetings, dates discussed:    Additional Comments:  Joylene Draft, RN 11/21/2015, 12:28 PM

## 2015-11-21 NOTE — Progress Notes (Signed)
Notified MD. Pt complaining of pain in the upper chest area. Pt and family states he takes Tramadol for pain at home and Nitroglycerin. MD ordered medications for pt. Awaiting pharmacy approval.

## 2015-11-22 ENCOUNTER — Encounter (HOSPITAL_COMMUNITY): Payer: Self-pay | Admitting: Gastroenterology

## 2015-11-22 DIAGNOSIS — K921 Melena: Secondary | ICD-10-CM | POA: Diagnosis not present

## 2015-11-22 DIAGNOSIS — D62 Acute posthemorrhagic anemia: Secondary | ICD-10-CM | POA: Diagnosis not present

## 2015-11-22 LAB — GLUCOSE, CAPILLARY
GLUCOSE-CAPILLARY: 155 mg/dL — AB (ref 65–99)
GLUCOSE-CAPILLARY: 241 mg/dL — AB (ref 65–99)
Glucose-Capillary: 115 mg/dL — ABNORMAL HIGH (ref 65–99)
Glucose-Capillary: 282 mg/dL — ABNORMAL HIGH (ref 65–99)

## 2015-11-22 LAB — CBC WITH DIFFERENTIAL/PLATELET
Basophils Absolute: 0 10*3/uL (ref 0.0–0.1)
Basophils Relative: 0 %
EOS ABS: 0 10*3/uL (ref 0.0–0.7)
EOS PCT: 0 %
HEMATOCRIT: 21 % — AB (ref 39.0–52.0)
Hemoglobin: 6.8 g/dL — CL (ref 13.0–17.0)
LYMPHS ABS: 0.9 10*3/uL (ref 0.7–4.0)
LYMPHS PCT: 8 %
MCH: 30.2 pg (ref 26.0–34.0)
MCHC: 32.4 g/dL (ref 30.0–36.0)
MCV: 93.3 fL (ref 78.0–100.0)
MONOS PCT: 3 %
Monocytes Absolute: 0.4 10*3/uL (ref 0.1–1.0)
NEUTROS PCT: 89 %
Neutro Abs: 10.6 10*3/uL — ABNORMAL HIGH (ref 1.7–7.7)
PLATELETS: 268 10*3/uL (ref 150–400)
RBC: 2.25 MIL/uL — AB (ref 4.22–5.81)
RDW: 16.2 % — ABNORMAL HIGH (ref 11.5–15.5)
WBC: 11.9 10*3/uL — AB (ref 4.0–10.5)

## 2015-11-22 LAB — LACTIC ACID, PLASMA
LACTIC ACID, VENOUS: 2.1 mmol/L — AB (ref 0.5–2.0)
Lactic Acid, Venous: 2.4 mmol/L (ref 0.5–2.0)

## 2015-11-22 LAB — OCCULT BLOOD X 1 CARD TO LAB, STOOL: FECAL OCCULT BLD: POSITIVE — AB

## 2015-11-22 LAB — PROTIME-INR
INR: 1.67 — ABNORMAL HIGH (ref 0.00–1.49)
Prothrombin Time: 19.7 seconds — ABNORMAL HIGH (ref 11.6–15.2)

## 2015-11-22 MED ORDER — ACETAMINOPHEN 325 MG PO TABS
650.0000 mg | ORAL_TABLET | Freq: Once | ORAL | Status: AC
  Administered 2015-11-22: 650 mg via ORAL
  Filled 2015-11-22: qty 2

## 2015-11-22 MED ORDER — WARFARIN SODIUM 5 MG PO TABS
7.5000 mg | ORAL_TABLET | Freq: Once | ORAL | Status: DC
Start: 2015-11-22 — End: 2015-11-22

## 2015-11-22 MED ORDER — SODIUM CHLORIDE 0.9 % IV SOLN
Freq: Once | INTRAVENOUS | Status: AC
  Administered 2015-11-22: 16:00:00 via INTRAVENOUS

## 2015-11-22 MED ORDER — FUROSEMIDE 10 MG/ML IJ SOLN
20.0000 mg | Freq: Once | INTRAMUSCULAR | Status: AC
  Administered 2015-11-22: 20 mg via INTRAVENOUS
  Filled 2015-11-22: qty 2

## 2015-11-22 MED ORDER — SODIUM CHLORIDE 0.9 % IV SOLN
INTRAVENOUS | Status: DC
Start: 2015-11-22 — End: 2015-11-23
  Administered 2015-11-22: 18:00:00 via INTRAVENOUS

## 2015-11-22 MED ORDER — DIPHENHYDRAMINE HCL 25 MG PO CAPS
25.0000 mg | ORAL_CAPSULE | Freq: Once | ORAL | Status: AC
  Administered 2015-11-22: 25 mg via ORAL
  Filled 2015-11-22: qty 1

## 2015-11-22 MED ORDER — SODIUM CHLORIDE 0.9 % IV SOLN: Freq: Once | INTRAVENOUS | Status: DC

## 2015-11-22 NOTE — Progress Notes (Signed)
Subjective: He says he is better with his breathing. He had a large black stool yesterday and his hemoglobin level has dropped. No other new complaints  Objective: Vital signs in last 24 hours: Temp:  [97.5 F (36.4 C)-98.1 F (36.7 C)] 97.5 F (36.4 C) (12/06 0650) Pulse Rate:  [93-110] 93 (12/06 0723) Resp:  [17-21] 17 (12/06 0723) BP: (112-120)/(62-65) 112/64 mmHg (12/06 0650) SpO2:  [94 %-98 %] 98 % (12/06 0723) Weight change:  Last BM Date: 11/21/15  Intake/Output from previous day: 12/05 0701 - 12/06 0700 In: 4361.7 [P.O.:720; I.V.:2841.7; IV Piggyback:800] Out: 1650 [Urine:1650]  PHYSICAL EXAM General appearance: alert, cooperative and mild distress Resp: rhonchi bilaterally Cardio: irregularly irregular rhythm GI: soft, non-tender; bowel sounds normal; no masses,  no organomegaly Extremities: extremities normal, atraumatic, no cyanosis or edema  Lab Results:  Results for orders placed or performed during the hospital encounter of 11/20/15 (from the past 48 hour(s))  Basic metabolic panel     Status: Abnormal   Collection Time: 11/20/15 12:49 PM  Result Value Ref Range   Sodium 138 135 - 145 mmol/L   Potassium 4.4 3.5 - 5.1 mmol/L   Chloride 104 101 - 111 mmol/L   CO2 23 22 - 32 mmol/L   Glucose, Bld 149 (H) 65 - 99 mg/dL   BUN 45 (H) 6 - 20 mg/dL   Creatinine, Ser 0.71 0.61 - 1.24 mg/dL   Calcium 8.7 (L) 8.9 - 10.3 mg/dL   GFR calc non Af Amer >60 >60 mL/min   GFR calc Af Amer >60 >60 mL/min    Comment: (NOTE) The eGFR has been calculated using the CKD EPI equation. This calculation has not been validated in all clinical situations. eGFR's persistently <60 mL/min signify possible Chronic Kidney Disease.    Anion gap 11 5 - 15  CBC with Differential/Platelet     Status: Abnormal   Collection Time: 11/20/15 12:49 PM  Result Value Ref Range   WBC 25.1 (H) 4.0 - 10.5 K/uL   RBC 3.22 (L) 4.22 - 5.81 MIL/uL   Hemoglobin 9.5 (L) 13.0 - 17.0 g/dL   HCT 29.9  (L) 39.0 - 52.0 %   MCV 92.9 78.0 - 100.0 fL   MCH 29.5 26.0 - 34.0 pg   MCHC 31.8 30.0 - 36.0 g/dL   RDW 15.5 11.5 - 15.5 %   Platelets 302 150 - 400 K/uL   Neutrophils Relative % 90 %   Lymphocytes Relative 6 %   Monocytes Relative 4 %   Eosinophils Relative 0 %   Basophils Relative 0 %   Neutro Abs 22.6 (H) 1.7 - 7.7 K/uL   Lymphs Abs 1.5 0.7 - 4.0 K/uL   Monocytes Absolute 1.0 0.1 - 1.0 K/uL   Eosinophils Absolute 0.0 0.0 - 0.7 K/uL   Basophils Absolute 0.0 0.0 - 0.1 K/uL  Lactic acid, plasma     Status: Abnormal   Collection Time: 11/20/15 12:49 PM  Result Value Ref Range   Lactic Acid, Venous 2.8 (HH) 0.5 - 2.0 mmol/L    Comment: CRITICAL RESULT CALLED TO, READ BACK BY AND VERIFIED WITH: CRUISE, A AT 1321 ON 11/20/2015 BY WOODS, M   Troponin I     Status: None   Collection Time: 11/20/15 12:49 PM  Result Value Ref Range   Troponin I <0.03 <0.031 ng/mL    Comment:        NO INDICATION OF MYOCARDIAL INJURY.   Blood culture (routine x 2)  Status: None (Preliminary result)   Collection Time: 11/20/15  1:26 PM  Result Value Ref Range   Specimen Description BLOOD LEFT ANTECUBITAL    Special Requests BOTTLES DRAWN AEROBIC AND ANAEROBIC 4CC    Culture NO GROWTH < 24 HOURS    Report Status PENDING   Protime-INR     Status: Abnormal   Collection Time: 11/20/15  1:30 PM  Result Value Ref Range   Prothrombin Time 40.1 (H) 11.6 - 15.2 seconds   INR 4.29 (H) 0.00 - 1.49  Blood culture (routine x 2)     Status: None (Preliminary result)   Collection Time: 11/20/15  1:40 PM  Result Value Ref Range   Specimen Description BLOOD BLOOD LEFT HAND    Special Requests BOTTLES DRAWN AEROBIC AND ANAEROBIC 4CC    Culture NO GROWTH < 24 HOURS    Report Status PENDING   Lactic acid, plasma     Status: None   Collection Time: 11/20/15  3:39 PM  Result Value Ref Range   Lactic Acid, Venous 1.8 0.5 - 2.0 mmol/L  Procalcitonin     Status: None   Collection Time: 11/20/15  3:39 PM   Result Value Ref Range   Procalcitonin <0.10 ng/mL    Comment:        Interpretation: PCT (Procalcitonin) <= 0.5 ng/mL: Systemic infection (sepsis) is not likely. Local bacterial infection is possible. (NOTE)         ICU PCT Algorithm               Non ICU PCT Algorithm    ----------------------------     ------------------------------         PCT < 0.25 ng/mL                 PCT < 0.1 ng/mL     Stopping of antibiotics            Stopping of antibiotics       strongly encouraged.               strongly encouraged.    ----------------------------     ------------------------------       PCT level decrease by               PCT < 0.25 ng/mL       >= 80% from peak PCT       OR PCT 0.25 - 0.5 ng/mL          Stopping of antibiotics                                             encouraged.     Stopping of antibiotics           encouraged.    ----------------------------     ------------------------------       PCT level decrease by              PCT >= 0.25 ng/mL       < 80% from peak PCT        AND PCT >= 0.5 ng/mL            Continuin g antibiotics  encouraged.       Continuing antibiotics            encouraged.    ----------------------------     ------------------------------     PCT level increase compared          PCT > 0.5 ng/mL         with peak PCT AND          PCT >= 0.5 ng/mL             Escalation of antibiotics                                          strongly encouraged.      Escalation of antibiotics        strongly encouraged.   Troponin I     Status: None   Collection Time: 11/20/15  3:39 PM  Result Value Ref Range   Troponin I <0.03 <0.031 ng/mL    Comment:        NO INDICATION OF MYOCARDIAL INJURY.   Glucose, capillary     Status: Abnormal   Collection Time: 11/20/15  4:48 PM  Result Value Ref Range   Glucose-Capillary 136 (H) 65 - 99 mg/dL   Comment 1 Notify RN   Lactic acid, plasma     Status: Abnormal    Collection Time: 11/20/15  8:09 PM  Result Value Ref Range   Lactic Acid, Venous 2.7 (HH) 0.5 - 2.0 mmol/L    Comment: CRITICAL RESULT CALLED TO, READ BACK BY AND VERIFIED WITH: MARTIN,M AT 2135 ON 11/20/2015 BY AGUNDIZ, E.   Glucose, capillary     Status: Abnormal   Collection Time: 11/20/15  9:41 PM  Result Value Ref Range   Glucose-Capillary 151 (H) 65 - 99 mg/dL   Comment 1 Notify RN    Comment 2 Document in Chart   Strep pneumoniae urinary antigen  (not at [facility])     Status: None   Collection Time: 11/21/15  1:05 AM  Result Value Ref Range   Strep Pneumo Urinary Antigen NEGATIVE NEGATIVE    Comment:        Infection due to S. pneumoniae cannot be absolutely ruled out since the antigen present may be below the detection limit of the test. Performed at [facility]   Protime-INR     Status: Abnormal   Collection Time: 11/21/15  7:29 AM  Result Value Ref Range   Prothrombin Time 29.9 (H) 11.6 - 15.2 seconds   INR 2.91 (H) 0.00 - 1.49  Glucose, capillary     Status: Abnormal   Collection Time: 11/21/15  7:34 AM  Result Value Ref Range   Glucose-Capillary 159 (H) 65 - 99 mg/dL  Lactic acid, plasma     Status: Abnormal   Collection Time: 11/21/15  9:19 AM  Result Value Ref Range   Lactic Acid, Venous 2.1 (HH) 0.5 - 2.0 mmol/L    Comment: CRITICAL RESULT CALLED TO, READ BACK BY AND VERIFIED WITH: WATKINS,T AT 10:10AM ON 11/21/15 BY FESTERMAN,C   Glucose, capillary     Status: Abnormal   Collection Time: 11/21/15 11:22 AM  Result Value Ref Range   Glucose-Capillary 116 (H) 65 - 99 mg/dL  Glucose, capillary     Status: Abnormal   Collection Time: 11/21/15  4:25 PM  Result Value Ref Range   Glucose-Capillary 240 (H) 65 -   99 mg/dL  MRSA PCR Screening     Status: Abnormal   Collection Time: 11/21/15  4:30 PM  Result Value Ref Range   MRSA by PCR POSITIVE (A) NEGATIVE    Comment:        The GeneXpert MRSA Assay (FDA approved for NASAL specimens only), is one  component of a comprehensive MRSA colonization surveillance program. It is not intended to diagnose MRSA infection nor to guide or monitor treatment for MRSA infections. RESULT CALLED TO, READ BACK BY AND VERIFIED WITH: Arvil Persons AT 2036 [id number] BY FORSYTH K   Culture, sputum-assessment     Status: None   Collection Time: 11/21/15  4:56 PM  Result Value Ref Range   Specimen Description SPUTUM EXPECTORATED    Special Requests NONE    Sputum evaluation      THIS SPECIMEN IS ACCEPTABLE. RESPIRATORY CULTURE REPORT TO FOLLOW. Performed at [facility]    Report Status 11/21/2015 FINAL   Culture, respiratory (NON-Expectorated)     Status: None (Preliminary result)   Collection Time: 11/21/15  4:56 PM  Result Value Ref Range   Specimen Description SPUTUM EXPECTORATED    Special Requests NONE    Gram Stain      ABUNDANT WBC PRESENT, PREDOMINANTLY PMN FEW SQUAMOUS EPITHELIAL CELLS PRESENT FEW GRAM POSITIVE COCCI IN PAIRS RARE GRAM POSITIVE RODS Performed at [facility]    Culture PENDING    Report Status PENDING   Glucose, capillary     Status: Abnormal   Collection Time: 11/21/15  9:27 PM  Result Value Ref Range   Glucose-Capillary 200 (H) 65 - 99 mg/dL   Comment 1 Notify RN    Comment 2 Document in Chart   Protime-INR     Status: Abnormal   Collection Time: 11/22/15  3:50 AM  Result Value Ref Range   Prothrombin Time 19.7 (H) 11.6 - 15.2 seconds   INR 1.67 (H) 0.00 - 1.49  CBC with Differential/Platelet     Status: Abnormal   Collection Time: 11/22/15  3:50 AM  Result Value Ref Range   WBC 11.9 (H) 4.0 - 10.5 K/uL   RBC 2.25 (L) 4.22 - 5.81 MIL/uL   Hemoglobin 6.8 (LL) 13.0 - 17.0 g/dL    Comment: REPEATED TO VERIFY DELTA CHECK NOTED CRITICAL RESULT CALLED TO, READ BACK BY AND VERIFIED WITH: HAMILTON S AT 0419 ON [id number] BY FORSYTH K    HCT 21.0 (L) 39.0 - 52.0 %   MCV 93.3 78.0 - 100.0 fL   MCH 30.2 26.0 - 34.0 pg   MCHC 32.4 30.0 - 36.0 g/dL    RDW 16.2 (H) 11.5 - 15.5 %   Platelets 268 150 - 400 K/uL   Neutrophils Relative % 89 %   Neutro Abs 10.6 (H) 1.7 - 7.7 K/uL   Lymphocytes Relative 8 %   Lymphs Abs 0.9 0.7 - 4.0 K/uL   Monocytes Relative 3 %   Monocytes Absolute 0.4 0.1 - 1.0 K/uL   Eosinophils Relative 0 %   Eosinophils Absolute 0.0 0.0 - 0.7 K/uL   Basophils Relative 0 %   Basophils Absolute 0.0 0.0 - 0.1 K/uL   RBC Morphology POLYCHROMASIA PRESENT     Comment: ELLIPTOCYTES  Lactic acid, plasma     Status: Abnormal   Collection Time: 11/22/15  3:50 AM  Result Value Ref Range   Lactic Acid, Venous 2.4 (HH) 0.5 - 2.0 mmol/L    Comment: CRITICAL RESULT CALLED TO, READ BACK BY  AND VERIFIED WITH: Arvil Persons AT 4034 ON [id number] BY FORSYTH K   Lactic acid, plasma     Status: Abnormal   Collection Time: 11/22/15  7:19 AM  Result Value Ref Range   Lactic Acid, Venous 2.1 (HH) 0.5 - 2.0 mmol/L    Comment: CRITICAL RESULT CALLED TO, READ BACK BY AND VERIFIED WITH: DILDY,V AT 8:00AM ON 11/22/15 BY FESTERMAN,C   Glucose, capillary     Status: Abnormal   Collection Time: 11/22/15  7:44 AM  Result Value Ref Range   Glucose-Capillary 155 (H) 65 - 99 mg/dL    ABGS No results for input(s): PHART, PO2ART, TCO2, HCO3 in the last 72 hours.  Invalid input(s): PCO2 CULTURES Recent Results (from the past 240 hour(s))  Blood culture (routine x 2)     Status: None (Preliminary result)   Collection Time: 11/20/15  1:26 PM  Result Value Ref Range Status   Specimen Description BLOOD LEFT ANTECUBITAL  Final   Special Requests BOTTLES DRAWN AEROBIC AND ANAEROBIC 4CC  Final   Culture NO GROWTH < 24 HOURS  Final   Report Status PENDING  Incomplete  Blood culture (routine x 2)     Status: None (Preliminary result)   Collection Time: 11/20/15  1:40 PM  Result Value Ref Range Status   Specimen Description BLOOD BLOOD LEFT HAND  Final   Special Requests BOTTLES DRAWN AEROBIC AND ANAEROBIC 4CC  Final   Culture NO GROWTH < 24 HOURS   Final   Report Status PENDING  Incomplete  MRSA PCR Screening     Status: Abnormal   Collection Time: 11/21/15  4:30 PM  Result Value Ref Range Status   MRSA by PCR POSITIVE (A) NEGATIVE Final    Comment:        The GeneXpert MRSA Assay (FDA approved for NASAL specimens only), is one component of a comprehensive MRSA colonization surveillance program. It is not intended to diagnose MRSA infection nor to guide or monitor treatment for MRSA infections. RESULT CALLED TO, READ BACK BY AND VERIFIED WITH: Arvil Persons AT 2036 [id number] BY FORSYTH K   Culture, sputum-assessment     Status: None   Collection Time: 11/21/15  4:56 PM  Result Value Ref Range Status   Specimen Description SPUTUM EXPECTORATED  Final   Special Requests NONE  Final   Sputum evaluation   Final    THIS SPECIMEN IS ACCEPTABLE. RESPIRATORY CULTURE REPORT TO FOLLOW. Performed at [facility]    Report Status 11/21/2015 FINAL  Final  Culture, respiratory (NON-Expectorated)     Status: None (Preliminary result)   Collection Time: 11/21/15  4:56 PM  Result Value Ref Range Status   Specimen Description SPUTUM EXPECTORATED  Final   Special Requests NONE  Final   Gram Stain   Final    ABUNDANT WBC PRESENT, PREDOMINANTLY PMN FEW SQUAMOUS EPITHELIAL CELLS PRESENT FEW GRAM POSITIVE COCCI IN PAIRS RARE GRAM POSITIVE RODS Performed at [facility]    Culture PENDING  Incomplete   Report Status PENDING  Incomplete   Studies/Results: Dg Chest 2 View  11/20/2015  CLINICAL DATA:  Midsternal chest pain radiating into the left arm. History of diabetes and coronary artery disease. EXAM: CHEST  2 VIEW COMPARISON:  CT 06/27/2014.  Radiographs 06/27/2014. FINDINGS: The heart size and mediastinal contours are stable with aortic atherosclerosis. There is chronic lung disease with central airway thickening and peribronchovascular nodularity. There are increased patchy opacities at both lung bases compared with the most  recent studies, but no consolidation or significant pleural effusion. The bones appear unchanged. IMPRESSION: Mildly progressive patchy airspace opacities at both lung bases compared with recent prior studies. These findings are nonspecific and may reflect atelectasis or chronic inflammation. Other sequela of chronic granulomatous disease appear unchanged. Electronically Signed   By: Richardean Sale M.D.   On: 11/20/2015 14:37    Medications:  Prior to Admission:  Prescriptions prior to admission  Medication Sig Dispense Refill Last Dose  . allopurinol (ZYLOPRIM) 300 MG tablet Take 300 mg by mouth daily.   11/20/2015 at Unknown time  . amLODipine (NORVASC) 5 MG tablet Take 1 tablet (5 mg total) by mouth daily. 180 tablet 3 11/20/2015 at Unknown time  . Ascorbic Acid (VITAMIN C) 500 MG tablet Take 500 mg by mouth 2 (two) times daily.    11/20/2015 at Unknown time  . aspirin EC 81 MG tablet Take 81 mg by mouth daily.   11/20/2015 at Unknown time  . cholecalciferol (VITAMIN D) 1000 UNITS tablet Take 4,000 Units by mouth daily.   11/20/2015 at Unknown time  . clobetasol cream (TEMOVATE) 2.11 % Apply 1 application topically 2 (two) times daily as needed (Dry Skin).    Past Week at Unknown time  . diltiazem (DILACOR XR) 240 MG 24 hr capsule Take 240 mg by mouth daily.     11/20/2015 at Unknown time  . docusate sodium (COLACE) 100 MG capsule Take 300 mg by mouth 2 (two) times daily.   11/20/2015 at Unknown time  . ferrous fumarate (HEMOCYTE - 106 MG FE) 325 (106 FE) MG TABS tablet Take 1 tablet by mouth 2 (two) times daily.   11/20/2015 at Unknown time  . finasteride (PROSCAR) 5 MG tablet Take 5 mg by mouth daily.     11/20/2015 at Unknown time  . folic acid (FOLVITE) 1 MG tablet Take 1 mg by mouth daily.   11/20/2015 at Unknown time  . galantamine (RAZADYNE) 8 MG tablet Take 8 mg by mouth 2 (two) times daily.     11/20/2015 at Unknown time  . glyBURIDE (DIABETA) 5 MG tablet Take 5-10 mg by mouth 2 (two) times  daily with a meal. 2 in the morning and 1 tab at night   11/20/2015 at Unknown time  . guaiFENesin 200 MG tablet Take 400 mg by mouth 2 (two) times daily.   11/20/2015 at Unknown time  . Insulin Glargine (LANTUS SOLOSTAR) 100 UNIT/ML Solostar Pen Inject 30 Units into the skin daily at 10 pm.   11/19/2015 at Unknown time  . levothyroxine (SYNTHROID, LEVOTHROID) 112 MCG tablet Take 124 mcg by mouth at bedtime.   11/19/2015 at Unknown time  . lisinopril (PRINIVIL,ZESTRIL) 20 MG tablet Take 10 mg by mouth daily.   11/20/2015 at Unknown time  . loratadine (CLARITIN) 10 MG tablet Take 10 mg by mouth daily.     11/20/2015 at Unknown time  . magnesium oxide (MAG-OX) 400 MG tablet Take 400 mg by mouth 2 (two) times daily.    11/20/2015 at Unknown time  . Multiple Vitamin (MULTIVITAMIN WITH MINERALS) TABS tablet Take 1 tablet by mouth daily.   11/20/2015 at Unknown time  . omeprazole (PRILOSEC) 20 MG capsule Take 20 mg by mouth 2 (two) times daily.     11/20/2015 at Unknown time  . ondansetron (ZOFRAN) 4 MG tablet Take 1 tablet by mouth every 4 (four) hours as needed for nausea or vomiting.    11/20/2015 at Unknown time  . oxybutynin (  DITROPAN) 5 MG tablet Take 5 mg by mouth 2 (two) times daily.    11/20/2015 at Unknown time  . rosuvastatin (CRESTOR) 20 MG tablet Take 1 tablet (20 mg total) by mouth daily. 90 tablet 3 11/20/2015 at Unknown time  . warfarin (COUMADIN) 5 MG tablet Take 5 mg by mouth at bedtime.    11/19/2015 at 1800  . [DISCONTINUED] metroNIDAZOLE (FLAGYL) 500 MG tablet Take 1 tablet by mouth 3 (three) times daily. Starting 11/07/2015 x 7 days.   Completed Course at Unknown time   Scheduled: . sodium chloride   Intravenous Once  . acetaminophen  650 mg Oral Once  . allopurinol  300 mg Oral Daily  . aspirin EC  81 mg Oral Daily  . azithromycin (ZITHROMAX) 500 MG IVPB  500 mg Intravenous Once  . azithromycin  500 mg Intravenous Q24H  . cefTRIAXone (ROCEPHIN)  IV  1 g Intravenous Q24H  . Chlorhexidine  Gluconate Cloth  6 each Topical Q0600  . clobetasol ointment   Topical BID  . diphenhydrAMINE  25 mg Oral Once  . docusate sodium  300 mg Oral BID  . finasteride  5 mg Oral Daily  . folic acid  1 mg Oral Daily  . furosemide  20 mg Intravenous Once  . galantamine  8 mg Oral BID  . guaiFENesin  600 mg Oral BID  . Influenza vac split quadrivalent PF  0.5 mL Intramuscular Tomorrow-1000  . insulin aspart  0-15 Units Subcutaneous TID WC  . insulin aspart  0-5 Units Subcutaneous QHS  . insulin glargine  30 Units Subcutaneous Q2200  . ipratropium-albuterol  3 mL Nebulization Q4H  . levothyroxine  125 mcg Oral QAC breakfast  . loratadine  10 mg Oral Daily  . magnesium oxide  400 mg Oral BID  . methylPREDNISolone (SOLU-MEDROL) injection  40 mg Intravenous Q12H  . mupirocin ointment  1 application Nasal BID  . oxybutynin  5 mg Oral BID  . pantoprazole  40 mg Oral BID AC  . rosuvastatin  20 mg Oral Daily  . vitamin C  500 mg Oral BID   Continuous: . sodium chloride 100 mL/hr at 11/21/15 2209   JGG:[id number], traMADol  Assesment: He was admitted with community-acquired pneumonia and sepsis. His lactate level is still elevated but he looks much better in general. However he has had GI bleeding and has dropped his hemoglobin to a level that he is going to require a blood transfusion. He is on Coumadin for chronic atrial fibrillation. Active Problems:   CAP (community acquired pneumonia)   Sepsis ([place])    Plan: Continue treatment for pneumonia. GI consult. Hold Coumadin blood transfusion today.    LOS: 2 days   Sho Salguero L 11/22/2015, 9:03 AM

## 2015-11-22 NOTE — Consult Note (Signed)
Referring Provider: Dr. Luan Pulling Primary Care Physician:  Alonza Bogus, MD Primary Gastroenterologist:  Dr. Oneida Alar   Date of Admission: 11/20/15 Date of Consultation: 11/22/15  Reason for Consultation:  Melena, acute blood loss anemia   HPI:  DOMANIK RAINVILLE Sr. is a 80 y.o. year old male with a history of HTN, CAD, afib, DM, COPD presenting with community-acquired pneumonia. His admitting Hgb was 9.5. Appears historically it was in the 12 range. He was on Coumadin for afib prior to admission, but he did not receive yesterday as his INR was supratherapeutic. Large black stool noted yesterday, with Hgb now 6.8 this morning. Coumadin has been discontinued.   Sometimes LUQ/upper abdomen/lower abdomen discomfort. Chronic. Unable to quantify. Sometimes exacerbated with eating. Last EGD in 2008 possibly by Dr. Laural Golden per patient, but I don't see this in the medical record. Last colonoscopy in remote past. No hematochezia. Notes vague nausea. Notes cervical solid food dysphagia. States throat gets sore. Apples get stuck. No aspirin powders. Takes Ibuprofen occasionally. No weight loss or lack of appetite. Prilosec BID as outpatient. Ate breakfast this morning.   Past Medical History  Diagnosis Date  . Acute exacerbation of chronic bronchitis ([place])   . History of tuberculosis   . CAD (coronary artery disease)   . Hyperlipidemia   . BPH (benign prostatic hypertrophy)   . Thyroid disease     hyperthyroidism  . Diabetes mellitus without complication ([place])   . GERD (gastroesophageal reflux disease)   . Diverticulitis     Past Surgical History  Procedure Laterality Date  . Cholecystectomy    . Thyroidectomy    . Ercp  09/2005    Dr. Laural Golden: biliary leak, ERCP with stent placement  . Ercp  11/2005    Dr. Laural Golden: ERCP with removal of biliary stent and sphincterotomy     Prior to Admission medications   Medication Sig Start Date End Date Taking? Authorizing Provider  allopurinol  (ZYLOPRIM) 300 MG tablet Take 300 mg by mouth daily.   Yes Historical Provider, MD  amLODipine (NORVASC) 5 MG tablet Take 1 tablet (5 mg total) by mouth daily. 09/16/14  Yes Arnoldo Lenis, MD  Ascorbic Acid (VITAMIN C) 500 MG tablet Take 500 mg by mouth 2 (two) times daily.    Yes Historical Provider, MD  aspirin EC 81 MG tablet Take 81 mg by mouth daily.   Yes Historical Provider, MD  cholecalciferol (VITAMIN D) 1000 UNITS tablet Take 4,000 Units by mouth daily.   Yes Historical Provider, MD  clobetasol cream (TEMOVATE) 4.96 % Apply 1 application topically 2 (two) times daily as needed (Dry Skin).  07/21/12  Yes Historical Provider, MD  diltiazem (DILACOR XR) 240 MG 24 hr capsule Take 240 mg by mouth daily.     Yes Historical Provider, MD  docusate sodium (COLACE) 100 MG capsule Take 300 mg by mouth 2 (two) times daily.   Yes Historical Provider, MD  ferrous fumarate (HEMOCYTE - 106 MG FE) 325 (106 FE) MG TABS tablet Take 1 tablet by mouth 2 (two) times daily.   Yes Historical Provider, MD  finasteride (PROSCAR) 5 MG tablet Take 5 mg by mouth daily.     Yes Historical Provider, MD  folic acid (FOLVITE) 1 MG tablet Take 1 mg by mouth daily.   Yes Historical Provider, MD  galantamine (RAZADYNE) 8 MG tablet Take 8 mg by mouth 2 (two) times daily.     Yes Historical Provider, MD  glyBURIDE (DIABETA) 5  MG tablet Take 5-10 mg by mouth 2 (two) times daily with a meal. 2 in the morning and 1 tab at night   Yes Historical Provider, MD  guaiFENesin 200 MG tablet Take 400 mg by mouth 2 (two) times daily.   Yes Historical Provider, MD  Insulin Glargine (LANTUS SOLOSTAR) 100 UNIT/ML Solostar Pen Inject 30 Units into the skin daily at 10 pm.   Yes Historical Provider, MD  levothyroxine (SYNTHROID, LEVOTHROID) 112 MCG tablet Take 124 mcg by mouth at bedtime.   Yes Historical Provider, MD  lisinopril (PRINIVIL,ZESTRIL) 20 MG tablet Take 10 mg by mouth daily.   Yes Historical Provider, MD  loratadine (CLARITIN) 10  MG tablet Take 10 mg by mouth daily.     Yes Historical Provider, MD  magnesium oxide (MAG-OX) 400 MG tablet Take 400 mg by mouth 2 (two) times daily.    Yes Historical Provider, MD  Multiple Vitamin (MULTIVITAMIN WITH MINERALS) TABS tablet Take 1 tablet by mouth daily.   Yes Historical Provider, MD  omeprazole (PRILOSEC) 20 MG capsule Take 20 mg by mouth 2 (two) times daily.     Yes Historical Provider, MD  ondansetron (ZOFRAN) 4 MG tablet Take 1 tablet by mouth every 4 (four) hours as needed for nausea or vomiting.  11/07/15  Yes Historical Provider, MD  oxybutynin (DITROPAN) 5 MG tablet Take 5 mg by mouth 2 (two) times daily.    Yes Historical Provider, MD  rosuvastatin (CRESTOR) 20 MG tablet Take 1 tablet (20 mg total) by mouth daily. 09/16/14  Yes Arnoldo Lenis, MD  warfarin (COUMADIN) 5 MG tablet Take 5 mg by mouth at bedtime.    Yes Historical Provider, MD    Current Facility-Administered Medications  Medication Dose Route Frequency Provider Last Rate Last Dose  . 0.9 %  sodium chloride infusion   Intravenous Continuous Hewitt Shorts [place], PA-C 100 mL/hr at 11/21/15 2209    . 0.9 %  sodium chloride infusion   Intravenous Once Sinda Du, MD      . acetaminophen (TYLENOL) tablet 650 mg  650 mg Oral Once Sinda Du, MD      . allopurinol (ZYLOPRIM) tablet 300 mg  300 mg Oral Daily Kathie Dike, MD   300 mg at 11/21/15 0943  . aspirin EC tablet 81 mg  81 mg Oral Daily Kathie Dike, MD   81 mg at 11/21/15 0942  . azithromycin (ZITHROMAX) 500 mg in dextrose 5 % 250 mL IVPB  500 mg Intravenous Once Elnora Morrison, MD      . azithromycin (ZITHROMAX) 500 mg in dextrose 5 % 250 mL IVPB  500 mg Intravenous Q24H Kathie Dike, MD   500 mg at 11/22/15 0107  . cefTRIAXone (ROCEPHIN) 1 g in dextrose 5 % 50 mL IVPB  1 g Intravenous Q24H Kathie Dike, MD   1 g at 11/21/15 1636  . Chlorhexidine Gluconate Cloth 2 % PADS 6 each  6 each Topical Q0600 Sinda Du, MD      . clobetasol ointment  (TEMOVATE) 0.05 %   Topical BID Sinda Du, MD      . diphenhydrAMINE (BENADRYL) capsule 25 mg  25 mg Oral Once Sinda Du, MD      . docusate sodium (COLACE) capsule 300 mg  300 mg Oral BID Kathie Dike, MD   300 mg at 11/21/15 2208  . finasteride (PROSCAR) tablet 5 mg  5 mg Oral Daily Kathie Dike, MD   5 mg at 11/21/15 0943  .  folic acid (FOLVITE) tablet 1 mg  1 mg Oral Daily Kathie Dike, MD   1 mg at 11/21/15 9675  . furosemide (LASIX) injection 20 mg  20 mg Intravenous Once Sinda Du, MD      . galantamine (RAZADYNE) tablet 8 mg  8 mg Oral BID Kathie Dike, MD   8 mg at 11/21/15 2220  . guaiFENesin (MUCINEX) 12 hr tablet 600 mg  600 mg Oral BID Sinda Du, MD   600 mg at 11/21/15 2208  . Influenza vac split quadrivalent PF (FLUARIX) injection 0.5 mL  0.5 mL Intramuscular Tomorrow-1000 Sinda Du, MD   0.5 mL at 11/21/15 1000  . insulin aspart (novoLOG) injection 0-15 Units  0-15 Units Subcutaneous TID WC Kathie Dike, MD   3 Units at 11/22/15 0919  . insulin aspart (novoLOG) injection 0-5 Units  0-5 Units Subcutaneous QHS Kathie Dike, MD   0 Units at 11/20/15 2249  . insulin glargine (LANTUS) injection 30 Units  30 Units Subcutaneous Q2200 Kathie Dike, MD   30 Units at 11/21/15 2208  . ipratropium-albuterol (DUONEB) 0.5-2.5 (3) MG/3ML nebulizer solution 3 mL  3 mL Nebulization Q4H Kathie Dike, MD   3 mL at 11/22/15 0721  . levothyroxine (SYNTHROID, LEVOTHROID) tablet 125 mcg  125 mcg Oral QAC breakfast Sinda Du, MD   125 mcg at 11/22/15 0919  . loratadine (CLARITIN) tablet 10 mg  10 mg Oral Daily Kathie Dike, MD   10 mg at 11/21/15 0945  . magnesium oxide (MAG-OX) tablet 400 mg  400 mg Oral BID Sinda Du, MD   400 mg at 11/21/15 2209  . methylPREDNISolone sodium succinate (SOLU-MEDROL) 40 mg/mL injection 40 mg  40 mg Intravenous Q12H Sinda Du, MD   40 mg at 11/21/15 2025  . mupirocin ointment (BACTROBAN) 2 % 1 application  1  application Nasal BID Sinda Du, MD   1 application at 91/63/84 2209  . nitroGLYCERIN (NITROSTAT) SL tablet 0.4 mg  0.4 mg Sublingual Q5 min PRN Asencion Noble, MD      . oxybutynin (DITROPAN) tablet 5 mg  5 mg Oral BID Kathie Dike, MD   5 mg at 11/21/15 2209  . pantoprazole (PROTONIX) EC tablet 40 mg  40 mg Oral BID AC Kathie Dike, MD   40 mg at 11/22/15 0919  . rosuvastatin (CRESTOR) tablet 20 mg  20 mg Oral Daily Kathie Dike, MD   20 mg at 11/21/15 1635  . traMADol (ULTRAM) tablet 50 mg  50 mg Oral Q4H PRN Asencion Noble, MD   50 mg at 11/21/15 2209  . vitamin C (ASCORBIC ACID) tablet 500 mg  500 mg Oral BID Kathie Dike, MD   500 mg at 11/21/15 2209    Allergies as of 11/20/2015 - Review Complete 11/20/2015  Allergen Reaction Noted  . Levaquin [levofloxacin in d5w]  09/02/2013    Family History  Problem Relation Age of Onset  . Heart attack Father   . Heart failure Mother   . Colon cancer Neg Hx   . Stomach cancer Sister     deceased in her 61s from stomach cancer    Social History   Social History  . Marital Status: Married    Spouse Name: N/A  . Number of Children: N/A  . Years of Education: N/A   Occupational History  . Retired    Social History Main Topics  . Smoking status: Former Smoker -- 0.75 packs/day for 50 years    Types: Cigarettes  Start date: 09/16/1944    Quit date: 10/17/1998  . Smokeless tobacco: Never Used  . Alcohol Use: No  . Drug Use: No  . Sexual Activity: Not on file   Other Topics Concern  . Not on file   Social History Narrative   Married   No regular exercise   Was in the [place] for some years   Used to work in [employer] fund[employer] and subsequently   Has a [place] MD in [place] Dr. Royce Macadamia    Review of Systems: Gen: see HPI  CV: Denies chest pain, heart palpitations, syncope, edema  Resp: +SOB, +DOE GI: see HPI  GU : Denies urinary burning, urinary frequency, urinary incontinence.  MS: +arthritis  Derm: Denies  rash, itching, dry skin Psych: Denies depression, anxiety,confusion, or memory loss Heme: Denies bruising, bleeding, and enlarged lymph nodes.  Physical Exam: Vital signs in last 24 hours: Temp:  [97.5 F (36.4 C)-98.1 F (36.7 C)] 97.5 F (36.4 C) (12/06 0650) Pulse Rate:  [93-110] 93 (12/06 0723) Resp:  [17-21] 17 (12/06 0723) BP: (112-120)/(62-65) 112/64 mmHg (12/06 0650) SpO2:  [94 %-98 %] 98 % (12/06 0723) Last BM Date: 11/21/15 General:   Alert, appears chronically ill  Head:  Normocephalic and atraumatic. Eyes:  Sclera clear, no icterus.   Conjunctiva pink. Ears:  Mild hard of hearing Nose:  No deformity, discharge,  or lesions. Mouth:  No deformity or lesions Lungs:  Clear throughout to auscultation.   No wheezes, crackles, or rhonchi. No acute distress. Heart:  Irregularly irregular Abdomen:  Soft, mild TTP LUQ/LLQ/RLQ and nondistended. No masses, hepatosplenomegaly or hernias noted. Normal bowel sounds, without guarding, and without rebound. Larger AP diameter, difficult to appreciate HSM.  Rectal:  Deferred  Msk:  Symmetrical without gross deformities. Normal posture. Extremities:  Without edema. Neurologic:  Alert and  oriented x4;  grossly normal neurologically. Psych:  Alert and cooperative. Normal mood and affect.  Intake/Output from previous day: 12/05 0701 - 12/06 0700 In: 4361.7 [P.O.:720; I.V.:2841.7; IV Piggyback:800] Out: 1650 [Urine:1650] Intake/Output this shift:    Lab Results:  Recent Labs  11/20/15 1249 11/22/15 0350  WBC 25.1* 11.9*  HGB 9.5* 6.8*  HCT 29.9* 21.0*  PLT 302 268   BMET  Recent Labs  11/20/15 1249  NA 138  K 4.4  CL 104  CO2 23  GLUCOSE 149*  BUN 45*  CREATININE 0.71  CALCIUM 8.7*   PT/INR  Recent Labs  11/21/15 0729 11/22/15 0350  LABPROT 29.9* 19.7*  INR 2.91* 1.67*    Studies/Results: Dg Chest 2 View  11/20/2015  CLINICAL DATA:  Midsternal chest pain radiating into the left arm. History of diabetes  and coronary artery disease. EXAM: CHEST  2 VIEW COMPARISON:  CT 06/27/2014.  Radiographs 06/27/2014. FINDINGS: The heart size and mediastinal contours are stable with aortic atherosclerosis. There is chronic lung disease with central airway thickening and peribronchovascular nodularity. There are increased patchy opacities at both lung bases compared with the most recent studies, but no consolidation or significant pleural effusion. The bones appear unchanged. IMPRESSION: Mildly progressive patchy airspace opacities at both lung bases compared with recent prior studies. These findings are nonspecific and may reflect atelectasis or chronic inflammation. Other sequela of chronic granulomatous disease appear unchanged. Electronically Signed   By: Richardean Sale M.D.   On: 11/20/2015 14:37    Impression: 80 year old male admitted with community-acquired pneumonia, now with evidence of melena and acute blood loss anemia. Last episode of melena yesterday.  Type and cross in process for blood transfusion. On Coumadin prior to admission for afib, with Coumadin last taken likely on 12/3 prior to admission: he has not received any Coumadin while inpatient as he was supratherapeutic on admission. INR today is 1.67. Notes vague esophageal solid food dysphagia as well. Endorses intermittent NSAID use. He has already eaten breakfast this morning, so we will plan on an EGD with dilation on 12/7 with Dr. Gala Romney. Continue to hold Coumadin for now. INR recheck in the morning.   Plan: NPO after midnight Continue to hold Coumadin Blood transfusion per attending EGD with possible dilation likely 12/7 with Dr. Gala Romney Recheck INR in am Continue Protonix BID   Orvil Feil, ANP-BC [facility]     LOS: 2 days    11/22/2015, 10:43 AM

## 2015-11-22 NOTE — Progress Notes (Signed)
CRITICAL VALUE ALERT  Critical value received:  Hemoglobin  Date of notification:  11/22/2015  Time of notification:  04:20  Critical value read back: yes  Nurse who received alert:  Stephannie Peters  MD notified (1st page):  Dr. Willey Blade  Time of first page:  04:21  MD notified (2nd page):  Time of second page:  Responding MD:  Dr. Willey Blade  Time MD responded:  04:23

## 2015-11-22 NOTE — Progress Notes (Signed)
CRITICAL VALUE ALERT  Critical value received:  Lactic Acid 2.4  Date of notification:  11/22/2015  Time of notification:  04:17  Critical value read back: yes  Nurse who received alert:  Stephannie Peters  MD notified (1st page):  Dr. Willey Blade  Time of first page:  04:21  MD notified (2nd page):  Time of second page:  Responding MD:  Dr. Willey Blade  Time MD responded:  04:23

## 2015-11-22 NOTE — Progress Notes (Signed)
CRITICAL VALUE ALERT  Critical value received:  MRSA positive  Date of notification:  11/21/15  Time of notification:  2140  Critical value read back: yes  Nurse who received alert:  Stephannie Peters  MD notified (1st page):  Dr. Willey Blade  Time of first page:  2145  MD notified (2nd page):  Time of second page:  Responding MD:  Dr. Willey Blade  Time MD responded:  Followed protocol.

## 2015-11-22 NOTE — Progress Notes (Signed)
Notified MD of critical values for pt lactic acid and hemoglobin. No new orders were given at this time.

## 2015-11-23 ENCOUNTER — Encounter (HOSPITAL_COMMUNITY)
Admission: EM | Disposition: A | Discharge status: Home / Self Care | Payer: Self-pay | Source: Home / Self Care | Attending: Pulmonary Disease

## 2015-11-23 DIAGNOSIS — K5710 Diverticulosis of small intestine without perforation or abscess without bleeding: Secondary | ICD-10-CM | POA: Diagnosis not present

## 2015-11-23 DIAGNOSIS — R1310 Dysphagia, unspecified: Secondary | ICD-10-CM | POA: Insufficient documentation

## 2015-11-23 DIAGNOSIS — K921 Melena: Secondary | ICD-10-CM | POA: Diagnosis not present

## 2015-11-23 DIAGNOSIS — K449 Diaphragmatic hernia without obstruction or gangrene: Secondary | ICD-10-CM | POA: Insufficient documentation

## 2015-11-23 HISTORY — PX: ESOPHAGOGASTRODUODENOSCOPY: SHX5428

## 2015-11-23 LAB — CBC WITH DIFFERENTIAL/PLATELET
Basophils Absolute: 0 10*3/uL (ref 0.0–0.1)
Basophils Relative: 0 %
EOS PCT: 0 %
Eosinophils Absolute: 0 10*3/uL (ref 0.0–0.7)
HEMATOCRIT: 23.8 % — AB (ref 39.0–52.0)
HEMOGLOBIN: 7.8 g/dL — AB (ref 13.0–17.0)
LYMPHS ABS: 0.8 10*3/uL (ref 0.7–4.0)
LYMPHS PCT: 10 %
MCH: 29.2 pg (ref 26.0–34.0)
MCHC: 32.8 g/dL (ref 30.0–36.0)
MCV: 89.1 fL (ref 78.0–100.0)
Monocytes Absolute: 0.3 10*3/uL (ref 0.1–1.0)
Monocytes Relative: 3 %
Neutro Abs: 6.8 10*3/uL (ref 1.7–7.7)
Neutrophils Relative %: 87 %
PLATELETS: 235 10*3/uL (ref 150–400)
RBC: 2.67 MIL/uL — AB (ref 4.22–5.81)
RDW: 17.2 % — AB (ref 11.5–15.5)
WBC: 7.9 10*3/uL (ref 4.0–10.5)

## 2015-11-23 LAB — LEGIONELLA PNEUMOPHILA SEROGP 1 UR AG: L. pneumophila Serogp 1 Ur Ag: NEGATIVE

## 2015-11-23 LAB — GLUCOSE, CAPILLARY
GLUCOSE-CAPILLARY: 155 mg/dL — AB (ref 65–99)
GLUCOSE-CAPILLARY: 148 mg/dL — AB (ref 65–99)
Glucose-Capillary: 262 mg/dL — ABNORMAL HIGH (ref 65–99)

## 2015-11-23 LAB — PROTIME-INR
INR: 1.31 (ref 0.00–1.49)
Prothrombin Time: 16.5 seconds — ABNORMAL HIGH (ref 11.6–15.2)

## 2015-11-23 LAB — PREPARE RBC (CROSSMATCH)

## 2015-11-23 LAB — HEMOGLOBIN AND HEMATOCRIT, BLOOD
HCT: 28.8 % — ABNORMAL LOW (ref 39.0–52.0)
HEMATOCRIT: 20.8 % — AB (ref 39.0–52.0)
HEMOGLOBIN: 6.8 g/dL — AB (ref 13.0–17.0)
Hemoglobin: 9.4 g/dL — ABNORMAL LOW (ref 13.0–17.0)

## 2015-11-23 SURGERY — EGD (ESOPHAGOGASTRODUODENOSCOPY)
Anesthesia: Moderate Sedation

## 2015-11-23 MED ORDER — DIPHENHYDRAMINE HCL 25 MG PO CAPS
25.0000 mg | ORAL_CAPSULE | Freq: Once | ORAL | Status: AC
  Administered 2015-11-23: 25 mg via ORAL
  Filled 2015-11-23: qty 1

## 2015-11-23 MED ORDER — SODIUM CHLORIDE 0.9 % IV SOLN
Freq: Once | INTRAVENOUS | Status: AC
  Administered 2015-11-24: 13:00:00 via INTRAVENOUS

## 2015-11-23 MED ORDER — PEG 3350-KCL-NA BICARB-NACL 420 G PO SOLR
2000.0000 mL | Freq: Once | ORAL | Status: AC
  Administered 2015-11-23: 2000 mL via ORAL
  Filled 2015-11-23 (×2): qty 4000

## 2015-11-23 MED ORDER — MIDAZOLAM HCL 5 MG/5ML IJ SOLN
INTRAMUSCULAR | Status: AC
  Filled 2015-11-23: qty 10

## 2015-11-23 MED ORDER — SODIUM CHLORIDE 0.9 % IV SOLN
Freq: Once | INTRAVENOUS | Status: AC
  Administered 2015-11-23: 1000 mL via INTRAVENOUS

## 2015-11-23 MED ORDER — LIDOCAINE VISCOUS 2 % MT SOLN
OROMUCOSAL | Status: DC | PRN
  Administered 2015-11-23: 1 via OROMUCOSAL

## 2015-11-23 MED ORDER — LIDOCAINE VISCOUS 2 % MT SOLN
OROMUCOSAL | Status: AC
  Filled 2015-11-23: qty 15

## 2015-11-23 MED ORDER — ONDANSETRON HCL 4 MG/2ML IJ SOLN
INTRAMUSCULAR | Status: AC
  Filled 2015-11-23: qty 2

## 2015-11-23 MED ORDER — IPRATROPIUM-ALBUTEROL 0.5-2.5 (3) MG/3ML IN SOLN
3.0000 mL | Freq: Three times a day (TID) | RESPIRATORY_TRACT | Status: DC
  Administered 2015-11-23 – 2015-11-24 (×2): 3 mL via RESPIRATORY_TRACT
  Filled 2015-11-23 (×2): qty 3

## 2015-11-23 MED ORDER — MEPERIDINE HCL 100 MG/ML IJ SOLN
INTRAMUSCULAR | Status: AC
  Filled 2015-11-23: qty 2

## 2015-11-23 MED ORDER — MIDAZOLAM HCL 5 MG/5ML IJ SOLN
INTRAMUSCULAR | Status: DC | PRN
  Administered 2015-11-23 (×2): 1 mg via INTRAVENOUS
  Administered 2015-11-23 (×2): 2 mg via INTRAVENOUS

## 2015-11-23 MED ORDER — PEG 3350-KCL-NABCB-NACL-NASULF 236 G PO SOLR
2000.0000 mL | Freq: Once | ORAL | Status: DC
  Filled 2015-11-23: qty 4000

## 2015-11-23 MED ORDER — SODIUM CHLORIDE 0.9 % IJ SOLN
INTRAMUSCULAR | Status: AC
  Filled 2015-11-23: qty 3

## 2015-11-23 MED ORDER — MEPERIDINE HCL 100 MG/ML IJ SOLN
INTRAMUSCULAR | Status: DC | PRN
  Administered 2015-11-23 (×2): 25 mg via INTRAVENOUS
  Administered 2015-11-23: 50 mg via INTRAVENOUS
  Administered 2015-11-23: 25 mg via INTRAVENOUS

## 2015-11-23 MED ORDER — ACETAMINOPHEN 325 MG PO TABS
650.0000 mg | ORAL_TABLET | Freq: Once | ORAL | Status: AC
  Administered 2015-11-23: 650 mg via ORAL
  Filled 2015-11-23: qty 2

## 2015-11-23 MED ORDER — FUROSEMIDE 10 MG/ML IJ SOLN
20.0000 mg | Freq: Once | INTRAMUSCULAR | Status: AC
  Administered 2015-11-23: 20 mg via INTRAVENOUS
  Filled 2015-11-23: qty 2

## 2015-11-23 MED ORDER — ONDANSETRON HCL 4 MG/2ML IJ SOLN
INTRAMUSCULAR | Status: DC | PRN
  Administered 2015-11-23: 4 mg via INTRAVENOUS

## 2015-11-23 NOTE — Progress Notes (Signed)
Patient seen and examined in short stay. Agree with need for EGD with the potential dilation of the esophagus as feasible/appropriate.  The risks, benefits, limitations, alternatives and imponderables have been reviewed with the patient. Potential for esophageal dilation, biopsy, etc. have also been reviewed.  Questions have been answered. All parties agreeable.

## 2015-11-23 NOTE — Progress Notes (Signed)
Inpatient Diabetes Program Recommendations  AACE/ADA: New Consensus Statement on Inpatient Glycemic Control (2015)  Target Ranges:  Prepandial:   less than 140 mg/dL      Peak postprandial:   less than 180 mg/dL (1-2 hours)      Critically ill patients:  140 - 180 mg/dL   Results for FORTINO, HAAG SR. (MRN [id number]) as of 11/23/2015 08:58  Ref. Range 11/22/2015 07:44 11/22/2015 11:40 11/22/2015 16:22 11/22/2015 20:36 11/23/2015 07:33  Glucose-Capillary Latest Ref Range: 65-99 mg/dL 155 (H) 115 (H) 282 (H) 241 (H) 155 (H)   Review of Glycemic Control  Diabetes history: DM2 Outpatient Diabetes medications: Glyburide 10 mg QAM, Glyburide 5 mg QPM, Lantus 30 units QHS Current orders for Inpatient glycemic control: Lantus 30 units QHS, Novolog 0-15 units TID with meals, Novolog 0-5 units HS  Inpatient Diabetes Program Recommendations: Insulin - Meal Coverage: Note patient is NPO this morning for EGD. Once diet is resumed and if steroids continued as ordered, please consider ordering Novolog 4 units TID with meals for meal coverage (in additon to Novolog correction scale).  Thanks, Barnie Alderman, RN, MSN, CDE Diabetes Coordinator Inpatient Diabetes Program [phone] (Team Pager from [place] to [place]) [phone] ([facility] office) [phone] [facility] office) [phone] [facility] office)

## 2015-11-23 NOTE — Progress Notes (Addendum)
Blood completed at 1215, 273m . Vital signs stable.  No reaction noted from patient.

## 2015-11-23 NOTE — Progress Notes (Signed)
Subjective: He is awake and alert. He feels okay. He seems to still have some bleeding and his hemoglobin level is still too low. He said he had a dark stool yesterday  Objective: Vital signs in last 24 hours: Temp:  [97.6 F (36.4 C)-98.3 F (36.8 C)] 98.3 F (36.8 C) (12/07 0545) Pulse Rate:  [77-107] 77 (12/07 0545) Resp:  [15-21] 17 (12/07 0545) BP: (116-141)/(55-72) 124/71 mmHg (12/07 0545) SpO2:  [95 %-100 %] 97 % (12/07 0714) Weight change:  Last BM Date: 11/22/15  Intake/Output from previous day: 12/06 0701 - 12/07 0700 In: 2023.7 [P.O.:240; I.V.:816.7; TWSFK:812; IV Piggyback:300] Out: 1300 [Urine:1300]  PHYSICAL EXAM General appearance: alert, cooperative and mild distress Resp: clear to auscultation bilaterally Cardio: irregularly irregular rhythm GI: Mildly tender Extremities: extremities normal, atraumatic, no cyanosis or edema  Lab Results:  Results for orders placed or performed during the hospital encounter of 11/20/15 (from the past 48 hour(s))  Lactic acid, plasma     Status: Abnormal   Collection Time: 11/21/15  9:19 AM  Result Value Ref Range   Lactic Acid, Venous 2.1 (HH) 0.5 - 2.0 mmol/L    Comment: CRITICAL RESULT CALLED TO, READ BACK BY AND VERIFIED WITH: WATKINS,T AT 10:10AM ON 11/21/15 BY FESTERMAN,C   Glucose, capillary     Status: Abnormal   Collection Time: 11/21/15 11:22 AM  Result Value Ref Range   Glucose-Capillary 116 (H) 65 - 99 mg/dL  Glucose, capillary     Status: Abnormal   Collection Time: 11/21/15  4:25 PM  Result Value Ref Range   Glucose-Capillary 240 (H) 65 - 99 mg/dL  MRSA PCR Screening     Status: Abnormal   Collection Time: 11/21/15  4:30 PM  Result Value Ref Range   MRSA by PCR POSITIVE (A) NEGATIVE    Comment:        The GeneXpert MRSA Assay (FDA approved for NASAL specimens only), is one component of a comprehensive MRSA colonization surveillance program. It is not intended to diagnose MRSA infection nor to guide  or monitor treatment for MRSA infections. RESULT CALLED TO, READ BACK BY AND VERIFIED WITH: Arvil Persons AT 2036 [id number] BY FORSYTH K   Culture, sputum-assessment     Status: None   Collection Time: 11/21/15  4:56 PM  Result Value Ref Range   Specimen Description SPUTUM EXPECTORATED    Special Requests NONE    Sputum evaluation      THIS SPECIMEN IS ACCEPTABLE. RESPIRATORY CULTURE REPORT TO FOLLOW. Performed at [facility]    Report Status 11/21/2015 FINAL   Culture, respiratory (NON-Expectorated)     Status: None (Preliminary result)   Collection Time: 11/21/15  4:56 PM  Result Value Ref Range   Specimen Description SPUTUM EXPECTORATED    Special Requests NONE    Gram Stain      ABUNDANT WBC PRESENT, PREDOMINANTLY PMN FEW SQUAMOUS EPITHELIAL CELLS PRESENT FEW GRAM POSITIVE COCCI IN PAIRS RARE GRAM POSITIVE RODS Performed at [facility]    Culture PENDING    Report Status PENDING   Glucose, capillary     Status: Abnormal   Collection Time: 11/21/15  9:27 PM  Result Value Ref Range   Glucose-Capillary 200 (H) 65 - 99 mg/dL   Comment 1 Notify RN    Comment 2 Document in Chart   Protime-INR     Status: Abnormal   Collection Time: 11/22/15  3:50 AM  Result Value Ref Range   Prothrombin Time 19.7 (H) 11.6 -  15.2 seconds   INR 1.67 (H) 0.00 - 1.49  CBC with Differential/Platelet     Status: Abnormal   Collection Time: 11/22/15  3:50 AM  Result Value Ref Range   WBC 11.9 (H) 4.0 - 10.5 K/uL   RBC 2.25 (L) 4.22 - 5.81 MIL/uL   Hemoglobin 6.8 (LL) 13.0 - 17.0 g/dL    Comment: REPEATED TO VERIFY DELTA CHECK NOTED CRITICAL RESULT CALLED TO, READ BACK BY AND VERIFIED WITH: HAMILTON S AT 0419 ON [id number] BY FORSYTH K    HCT 21.0 (L) 39.0 - 52.0 %   MCV 93.3 78.0 - 100.0 fL   MCH 30.2 26.0 - 34.0 pg   MCHC 32.4 30.0 - 36.0 g/dL   RDW 16.2 (H) 11.5 - 15.5 %   Platelets 268 150 - 400 K/uL   Neutrophils Relative % 89 %   Neutro Abs 10.6 (H) 1.7 - 7.7 K/uL    Lymphocytes Relative 8 %   Lymphs Abs 0.9 0.7 - 4.0 K/uL   Monocytes Relative 3 %   Monocytes Absolute 0.4 0.1 - 1.0 K/uL   Eosinophils Relative 0 %   Eosinophils Absolute 0.0 0.0 - 0.7 K/uL   Basophils Relative 0 %   Basophils Absolute 0.0 0.0 - 0.1 K/uL   RBC Morphology POLYCHROMASIA PRESENT     Comment: ELLIPTOCYTES  Lactic acid, plasma     Status: Abnormal   Collection Time: 11/22/15  3:50 AM  Result Value Ref Range   Lactic Acid, Venous 2.4 (HH) 0.5 - 2.0 mmol/L    Comment: CRITICAL RESULT CALLED TO, READ BACK BY AND VERIFIED WITH: MCKINNEY S AT 0417 ON [id number] BY FORSYTH K   Lactic acid, plasma     Status: Abnormal   Collection Time: 11/22/15  7:19 AM  Result Value Ref Range   Lactic Acid, Venous 2.1 (HH) 0.5 - 2.0 mmol/L    Comment: CRITICAL RESULT CALLED TO, READ BACK BY AND VERIFIED WITH: DILDY,V AT 8:00AM ON 11/22/15 BY FESTERMAN,C   Glucose, capillary     Status: Abnormal   Collection Time: 11/22/15  7:44 AM  Result Value Ref Range   Glucose-Capillary 155 (H) 65 - 99 mg/dL  Occult blood card to lab, stool RN will collect     Status: Abnormal   Collection Time: 11/22/15  8:30 AM  Result Value Ref Range   Fecal Occult Bld POSITIVE (A) NEGATIVE  Type and screen [place]     Status: None (Preliminary result)   Collection Time: 11/22/15  9:50 AM  Result Value Ref Range   ABO/RH(D) O POS    Antibody Screen NEG    Sample Expiration 11/25/2015    Unit Number [id number]    Blood Component Type RED CELLS,LR    Unit division 00    Status of Unit ISSUED    Transfusion Status OK TO TRANSFUSE    Crossmatch Result Compatible    Unit Number [id number]    Blood Component Type RED CELLS,LR    Unit division 00    Status of Unit ISSUED,FINAL    Transfusion Status OK TO TRANSFUSE    Crossmatch Result Compatible    Unit Number [id number]    Blood Component Type RBC LR PHER1    Unit division 00    Status of Unit ALLOCATED    Transfusion Status OK TO  TRANSFUSE    Crossmatch Result Compatible    Unit Number [id number]    Blood Component Type RED CELLS,LR    Unit  division 00    Status of Unit ALLOCATED    Transfusion Status OK TO TRANSFUSE    Crossmatch Result Compatible   Prepare RBC     Status: None   Collection Time: 11/22/15  9:50 AM  Result Value Ref Range   Order Confirmation ORDER PROCESSED BY BLOOD BANK   Glucose, capillary     Status: Abnormal   Collection Time: 11/22/15 11:40 AM  Result Value Ref Range   Glucose-Capillary 115 (H) 65 - 99 mg/dL  Glucose, capillary     Status: Abnormal   Collection Time: 11/22/15  4:22 PM  Result Value Ref Range   Glucose-Capillary 282 (H) 65 - 99 mg/dL   Comment 1 Notify RN    Comment 2 Document in Chart   Glucose, capillary     Status: Abnormal   Collection Time: 11/22/15  8:36 PM  Result Value Ref Range   Glucose-Capillary 241 (H) 65 - 99 mg/dL   Comment 1 Notify RN    Comment 2 Document in Chart   Hemoglobin and hematocrit, blood     Status: Abnormal   Collection Time: 11/23/15  1:40 AM  Result Value Ref Range   Hemoglobin 6.8 (LL) 13.0 - 17.0 g/dL    Comment: DELTA CHECK NOTED CRITICAL RESULT CALLED TO, READ BACK BY AND VERIFIED WITH: GAVIN B AT 0154 ON [id number] BY FORSYTH K    HCT 20.8 (L) 39.0 - 52.0 %  Protime-INR     Status: Abnormal   Collection Time: 11/23/15  6:40 AM  Result Value Ref Range   Prothrombin Time 16.5 (H) 11.6 - 15.2 seconds   INR 1.31 0.00 - 1.49  CBC with Differential/Platelet     Status: Abnormal   Collection Time: 11/23/15  6:40 AM  Result Value Ref Range   WBC 7.9 4.0 - 10.5 K/uL   RBC 2.67 (L) 4.22 - 5.81 MIL/uL   Hemoglobin 7.8 (L) 13.0 - 17.0 g/dL   HCT 23.8 (L) 39.0 - 52.0 %   MCV 89.1 78.0 - 100.0 fL   MCH 29.2 26.0 - 34.0 pg   MCHC 32.8 30.0 - 36.0 g/dL   RDW 17.2 (H) 11.5 - 15.5 %   Platelets 235 150 - 400 K/uL   Neutrophils Relative % 87 %   Neutro Abs 6.8 1.7 - 7.7 K/uL   Lymphocytes Relative 10 %   Lymphs Abs 0.8 0.7 - 4.0  K/uL   Monocytes Relative 3 %   Monocytes Absolute 0.3 0.1 - 1.0 K/uL   Eosinophils Relative 0 %   Eosinophils Absolute 0.0 0.0 - 0.7 K/uL   Basophils Relative 0 %   Basophils Absolute 0.0 0.0 - 0.1 K/uL  Glucose, capillary     Status: Abnormal   Collection Time: 11/23/15  7:33 AM  Result Value Ref Range   Glucose-Capillary 155 (H) 65 - 99 mg/dL   Comment 1 Notify RN    Comment 2 Document in Chart     ABGS No results for input(s): PHART, PO2ART, TCO2, HCO3 in the last 72 hours.  Invalid input(s): PCO2 CULTURES Recent Results (from the past 240 hour(s))  Blood culture (routine x 2)     Status: None (Preliminary result)   Collection Time: 11/20/15  1:26 PM  Result Value Ref Range Status   Specimen Description BLOOD LEFT ANTECUBITAL  Final   Special Requests BOTTLES DRAWN AEROBIC AND ANAEROBIC 4CC  Final   Culture NO GROWTH 2 DAYS  Final   Report Status PENDING  Incomplete  Blood culture (routine x 2)     Status: None (Preliminary result)   Collection Time: 11/20/15  1:40 PM  Result Value Ref Range Status   Specimen Description BLOOD LEFT HAND  Final   Special Requests BOTTLES DRAWN AEROBIC AND ANAEROBIC 4CC  Final   Culture NO GROWTH 2 DAYS  Final   Report Status PENDING  Incomplete  MRSA PCR Screening     Status: Abnormal   Collection Time: 11/21/15  4:30 PM  Result Value Ref Range Status   MRSA by PCR POSITIVE (A) NEGATIVE Final    Comment:        The GeneXpert MRSA Assay (FDA approved for NASAL specimens only), is one component of a comprehensive MRSA colonization surveillance program. It is not intended to diagnose MRSA infection nor to guide or monitor treatment for MRSA infections. RESULT CALLED TO, READ BACK BY AND VERIFIED WITH: Arvil Persons AT 2036 [id number] BY FORSYTH K   Culture, sputum-assessment     Status: None   Collection Time: 11/21/15  4:56 PM  Result Value Ref Range Status   Specimen Description SPUTUM EXPECTORATED  Final   Special Requests NONE   Final   Sputum evaluation   Final    THIS SPECIMEN IS ACCEPTABLE. RESPIRATORY CULTURE REPORT TO FOLLOW. Performed at [facility]    Report Status 11/21/2015 FINAL  Final  Culture, respiratory (NON-Expectorated)     Status: None (Preliminary result)   Collection Time: 11/21/15  4:56 PM  Result Value Ref Range Status   Specimen Description SPUTUM EXPECTORATED  Final   Special Requests NONE  Final   Gram Stain   Final    ABUNDANT WBC PRESENT, PREDOMINANTLY PMN FEW SQUAMOUS EPITHELIAL CELLS PRESENT FEW GRAM POSITIVE COCCI IN PAIRS RARE GRAM POSITIVE RODS Performed at [facility]    Culture PENDING  Incomplete   Report Status PENDING  Incomplete   Studies/Results: No results found.  Medications:  Prior to Admission:  Prescriptions prior to admission  Medication Sig Dispense Refill Last Dose  . allopurinol (ZYLOPRIM) 300 MG tablet Take 300 mg by mouth daily.   11/20/2015 at Unknown time  . amLODipine (NORVASC) 5 MG tablet Take 1 tablet (5 mg total) by mouth daily. 180 tablet 3 11/20/2015 at Unknown time  . Ascorbic Acid (VITAMIN C) 500 MG tablet Take 500 mg by mouth 2 (two) times daily.    11/20/2015 at Unknown time  . aspirin EC 81 MG tablet Take 81 mg by mouth daily.   11/20/2015 at Unknown time  . cholecalciferol (VITAMIN D) 1000 UNITS tablet Take 4,000 Units by mouth daily.   11/20/2015 at Unknown time  . clobetasol cream (TEMOVATE) 0.62 % Apply 1 application topically 2 (two) times daily as needed (Dry Skin).    Past Week at Unknown time  . diltiazem (DILACOR XR) 240 MG 24 hr capsule Take 240 mg by mouth daily.     11/20/2015 at Unknown time  . docusate sodium (COLACE) 100 MG capsule Take 300 mg by mouth 2 (two) times daily.   11/20/2015 at Unknown time  . ferrous fumarate (HEMOCYTE - 106 MG FE) 325 (106 FE) MG TABS tablet Take 1 tablet by mouth 2 (two) times daily.   11/20/2015 at Unknown time  . finasteride (PROSCAR) 5 MG tablet Take 5 mg by mouth daily.     11/20/2015  at Unknown time  . folic acid (FOLVITE) 1 MG tablet Take 1 mg by mouth daily.   11/20/2015  at Unknown time  . galantamine (RAZADYNE) 8 MG tablet Take 8 mg by mouth 2 (two) times daily.     11/20/2015 at Unknown time  . glyBURIDE (DIABETA) 5 MG tablet Take 5-10 mg by mouth 2 (two) times daily with a meal. 2 in the morning and 1 tab at night   11/20/2015 at Unknown time  . guaiFENesin 200 MG tablet Take 400 mg by mouth 2 (two) times daily.   11/20/2015 at Unknown time  . Insulin Glargine (LANTUS SOLOSTAR) 100 UNIT/ML Solostar Pen Inject 30 Units into the skin daily at 10 pm.   11/19/2015 at Unknown time  . levothyroxine (SYNTHROID, LEVOTHROID) 112 MCG tablet Take 124 mcg by mouth at bedtime.   11/19/2015 at Unknown time  . lisinopril (PRINIVIL,ZESTRIL) 20 MG tablet Take 10 mg by mouth daily.   11/20/2015 at Unknown time  . loratadine (CLARITIN) 10 MG tablet Take 10 mg by mouth daily.     11/20/2015 at Unknown time  . magnesium oxide (MAG-OX) 400 MG tablet Take 400 mg by mouth 2 (two) times daily.    11/20/2015 at Unknown time  . Multiple Vitamin (MULTIVITAMIN WITH MINERALS) TABS tablet Take 1 tablet by mouth daily.   11/20/2015 at Unknown time  . omeprazole (PRILOSEC) 20 MG capsule Take 20 mg by mouth 2 (two) times daily.     11/20/2015 at Unknown time  . ondansetron (ZOFRAN) 4 MG tablet Take 1 tablet by mouth every 4 (four) hours as needed for nausea or vomiting.    11/20/2015 at Unknown time  . oxybutynin (DITROPAN) 5 MG tablet Take 5 mg by mouth 2 (two) times daily.    11/20/2015 at Unknown time  . rosuvastatin (CRESTOR) 20 MG tablet Take 1 tablet (20 mg total) by mouth daily. 90 tablet 3 11/20/2015 at Unknown time  . warfarin (COUMADIN) 5 MG tablet Take 5 mg by mouth at bedtime.    11/19/2015 at 1800  . [DISCONTINUED] metroNIDAZOLE (FLAGYL) 500 MG tablet Take 1 tablet by mouth 3 (three) times daily. Starting 11/07/2015 x 7 days.   Completed Course at Unknown time   Scheduled: . sodium chloride   Intravenous  Once  . sodium chloride   Intravenous Once  . sodium chloride   Intravenous Once  . allopurinol  300 mg Oral Daily  . aspirin EC  81 mg Oral Daily  . azithromycin (ZITHROMAX) 500 MG IVPB  500 mg Intravenous Once  . azithromycin  500 mg Intravenous Q24H  . cefTRIAXone (ROCEPHIN)  IV  1 g Intravenous Q24H  . Chlorhexidine Gluconate Cloth  6 each Topical Q0600  . clobetasol ointment   Topical BID  . docusate sodium  300 mg Oral BID  . finasteride  5 mg Oral Daily  . folic acid  1 mg Oral Daily  . galantamine  8 mg Oral BID  . guaiFENesin  600 mg Oral BID  . Influenza vac split quadrivalent PF  0.5 mL Intramuscular Tomorrow-1000  . insulin aspart  0-15 Units Subcutaneous TID WC  . insulin aspart  0-5 Units Subcutaneous QHS  . insulin glargine  30 Units Subcutaneous Q2200  . ipratropium-albuterol  3 mL Nebulization Q4H  . levothyroxine  125 mcg Oral QAC breakfast  . loratadine  10 mg Oral Daily  . magnesium oxide  400 mg Oral BID  . methylPREDNISolone (SOLU-MEDROL) injection  40 mg Intravenous Q12H  . mupirocin ointment  1 application Nasal BID  . oxybutynin  5 mg Oral BID  .  pantoprazole  40 mg Oral BID AC  . rosuvastatin  20 mg Oral Daily  . vitamin C  500 mg Oral BID   Continuous: . sodium chloride 100 mL/hr at 11/21/15 2209  . sodium chloride 20 mL/hr at 11/22/15 1800   TRZ:NBVAPOLIDCVUD, traMADol  Assesment: He was admitted with community-acquired pneumonia. He has atrial fibrillation and is chronically anticoagulated. He has had GI bleeding and is to undergo EGD today. His pneumonia is much better. Active Problems:   Atrial fibrillation (HCC)   CAP (community acquired pneumonia)   COPD (chronic obstructive pulmonary disease) (HCC)   Diabetes mellitus without complication ([place])   Sepsis ([place])   Acute blood loss anemia   Melena    Plan: EGD today. Continue to hold Coumadin. Continue IV antibiotics. Blood transfusion today.    LOS: 3 days   Krew Hortman  L 11/23/2015, 9:13 AM

## 2015-11-23 NOTE — Op Note (Signed)
[place], [address]   ENDOSCOPY PROCEDURE REPORT  PATIENT: Caleb [name], Caleb [name]  MR#: [id number] BIRTHDATE: [birth year] , 80  yrs. old GENDER: male ENDOSCOPIST: R.  Garfield Cornea, MD FACP FACG REFERRED BY:  Sinda Du, M.D. PROCEDURE DATE:  December 11, 2015 PROCEDURE:  EGD, diagnostic and Maloney dilation of esophagus INDICATIONS:  Esophageal dysphagia; recent GI bleed. MEDICATIONS: Versed 6 mg IV and Demerol 125 mg IV in divided doses. Zofran 4 mg IV.  Xylocaine gel orally. ASA CLASS:      Class II  CONSENT: The risks, benefits, limitations, alternatives and imponderables have been discussed.  The potential for biopsy, esophogeal dilation, etc. have also been reviewed.  Questions have been answered.  All parties agreeable.  Please see the history and physical in the medical record for more information.  DESCRIPTION OF PROCEDURE: After the risks benefits and alternatives of the procedure were thoroughly explained, informed consent was obtained.  The EG-[id number] ([id number]) endoscope was introduced through the mouth and advanced to the second portion of the duodenum , limited by Without limitations. The instrument was slowly withdrawn as the mucosa was fully examined. Estimated blood loss is zero unless otherwise noted in this procedure report.    Tubular esophagus appeared normal / patent throughout its course. Stomach empty.  Small hiatal hernia.  Normal-appearing gastric mucosa.  Patent pylorus.  Examination the first and second portion of the duodenum revealed an apparent large duodenal diverticulum in the second portion containing vegetable debris.  I was not able to wash all the debris out but this appeared to be a diverticulum lined with normal duodenal mucosa.  I did not see an ulcer crater or neoplasm.  Scope was withdrawn and a 56 [place] Maloney dilator was passed to full insertion easily.  A look back revealed no  apparent complication related to this maneuver.  Please note there was  no blood in the upper GI tract.  Retroflexed views revealed a hiatal hernia.     The scope was then withdrawn from the patient and the procedure completed.  COMPLICATIONS: There were no immediate complications.  ENDOSCOPIC IMPRESSION: Normal-appearing esophagus?"status post Maloney dilation. Hiatal hernia. Duodenal diverticulum. No explanation for GI bleeding found on today's examination.  It's been some time since he had a colonoscopy. Since he has present with a significant GI bleed, we will proceed with a diagnostic colonoscopy tomorrow. My findings and recommendations have been discussed at length with multiple family members.   Risks, benefits and alternatives to colonoscopy have been discussed. Questions answered. All parties agreeable.  RECOMMENDATIONS: Diagnostic colonoscopy December 8.  REPEAT EXAM:  eSigned:  R. Garfield Cornea, MD Rosalita Chessman [facility] 12-11-15 1:36 PM    CC:  CPT CODES: ICD CODES:  The ICD and CPT codes recommended by this software are interpretations from the data that the clinical staff has captured with the software.  The verification of the translation of this report to the ICD and CPT codes and modifiers is the sole responsibility of the health care institution and practicing physician where this report was generated.  [employer] will not be held responsible for the validity of the ICD and CPT codes included on this report.  AMA assumes no liability for data contained or not contained herein. CPT is a Designer, television/film set of the [employer].  PATIENT NAME:  Caleb [name], Caleb [name] MR#: [id number]

## 2015-11-24 ENCOUNTER — Encounter (HOSPITAL_COMMUNITY)
Admission: EM | Disposition: A | Discharge status: Home / Self Care | Payer: Self-pay | Source: Home / Self Care | Attending: Pulmonary Disease

## 2015-11-24 ENCOUNTER — Encounter (HOSPITAL_COMMUNITY): Payer: Self-pay | Admitting: *Deleted

## 2015-11-24 DIAGNOSIS — K5730 Diverticulosis of large intestine without perforation or abscess without bleeding: Secondary | ICD-10-CM | POA: Insufficient documentation

## 2015-11-24 DIAGNOSIS — D122 Benign neoplasm of ascending colon: Secondary | ICD-10-CM | POA: Diagnosis not present

## 2015-11-24 DIAGNOSIS — K921 Melena: Secondary | ICD-10-CM | POA: Diagnosis not present

## 2015-11-24 DIAGNOSIS — Z86010 Personal history of colonic polyps: Secondary | ICD-10-CM | POA: Insufficient documentation

## 2015-11-24 HISTORY — PX: GIVENS CAPSULE STUDY: SHX5432

## 2015-11-24 HISTORY — PX: COLONOSCOPY: SHX5424

## 2015-11-24 LAB — PROTIME-INR
INR: 1.2 (ref 0.00–1.49)
Prothrombin Time: 15.3 seconds — ABNORMAL HIGH (ref 11.6–15.2)

## 2015-11-24 LAB — CBC WITH DIFFERENTIAL/PLATELET
Basophils Absolute: 0 10*3/uL (ref 0.0–0.1)
Basophils Relative: 0 %
Eosinophils Absolute: 0 10*3/uL (ref 0.0–0.7)
Eosinophils Relative: 0 %
HEMATOCRIT: 32.4 % — AB (ref 39.0–52.0)
Hemoglobin: 10.3 g/dL — ABNORMAL LOW (ref 13.0–17.0)
LYMPHS ABS: 0.8 10*3/uL (ref 0.7–4.0)
LYMPHS PCT: 11 %
MCH: 28.6 pg (ref 26.0–34.0)
MCHC: 31.8 g/dL (ref 30.0–36.0)
MCV: 90 fL (ref 78.0–100.0)
MONO ABS: 0.4 10*3/uL (ref 0.1–1.0)
MONOS PCT: 5 %
NEUTROS ABS: 5.8 10*3/uL (ref 1.7–7.7)
Neutrophils Relative %: 84 %
Platelets: 262 10*3/uL (ref 150–400)
RBC: 3.6 MIL/uL — ABNORMAL LOW (ref 4.22–5.81)
RDW: 17.2 % — AB (ref 11.5–15.5)
WBC: 6.9 10*3/uL (ref 4.0–10.5)

## 2015-11-24 LAB — TYPE AND SCREEN
ABO/RH(D): O POS
ANTIBODY SCREEN: NEGATIVE
UNIT DIVISION: 0
UNIT DIVISION: 0
Unit division: 0
Unit division: 0

## 2015-11-24 LAB — GLUCOSE, CAPILLARY
GLUCOSE-CAPILLARY: 92 mg/dL (ref 65–99)
GLUCOSE-CAPILLARY: 127 mg/dL — AB (ref 65–99)
Glucose-Capillary: 130 mg/dL — ABNORMAL HIGH (ref 65–99)
Glucose-Capillary: 82 mg/dL (ref 65–99)
Glucose-Capillary: 95 mg/dL (ref 65–99)
Glucose-Capillary: 98 mg/dL (ref 65–99)

## 2015-11-24 SURGERY — COLONOSCOPY
Anesthesia: Moderate Sedation

## 2015-11-24 SURGERY — IMAGING PROCEDURE, GI TRACT, INTRALUMINAL, VIA CAPSULE

## 2015-11-24 MED ORDER — STERILE WATER FOR IRRIGATION IR SOLN
Status: DC | PRN
  Administered 2015-11-24: 2.5 mL

## 2015-11-24 MED ORDER — MIDAZOLAM HCL 5 MG/5ML IJ SOLN
INTRAMUSCULAR | Status: AC
  Administered 2015-11-24: 13:00:00
  Filled 2015-11-24: qty 10

## 2015-11-24 MED ORDER — ONDANSETRON HCL 4 MG/2ML IJ SOLN
INTRAMUSCULAR | Status: AC
  Administered 2015-11-24: 13:00:00
  Filled 2015-11-24: qty 2

## 2015-11-24 MED ORDER — BISACODYL 10 MG RE SUPP
10.0000 mg | Freq: Once | RECTAL | Status: AC
  Administered 2015-11-24: 10 mg via RECTAL
  Filled 2015-11-24: qty 1

## 2015-11-24 MED ORDER — MEPERIDINE HCL 100 MG/ML IJ SOLN
INTRAMUSCULAR | Status: AC
  Administered 2015-11-24: 13:00:00
  Filled 2015-11-24: qty 2

## 2015-11-24 MED ORDER — METOCLOPRAMIDE HCL 5 MG/ML IJ SOLN
10.0000 mg | Freq: Once | INTRAMUSCULAR | Status: AC
  Administered 2015-11-24: 10 mg via INTRAVENOUS
  Filled 2015-11-24: qty 2

## 2015-11-24 MED ORDER — MIDAZOLAM HCL 5 MG/5ML IJ SOLN
INTRAMUSCULAR | Status: DC | PRN
  Administered 2015-11-24: 2 mg via INTRAVENOUS
  Administered 2015-11-24: 1 mg via INTRAVENOUS
  Administered 2015-11-24: 2 mg via INTRAVENOUS

## 2015-11-24 MED ORDER — MEPERIDINE HCL 100 MG/ML IJ SOLN
INTRAMUSCULAR | Status: DC | PRN
  Administered 2015-11-24 (×2): 25 mg via INTRAVENOUS

## 2015-11-24 MED ORDER — SODIUM CHLORIDE 0.9 % IV SOLN: INTRAVENOUS | Status: DC

## 2015-11-24 MED ORDER — IPRATROPIUM-ALBUTEROL 0.5-2.5 (3) MG/3ML IN SOLN
3.0000 mL | Freq: Two times a day (BID) | RESPIRATORY_TRACT | Status: DC
  Administered 2015-11-24 – 2015-11-25 (×3): 3 mL via RESPIRATORY_TRACT
  Filled 2015-11-24 (×5): qty 3

## 2015-11-24 MED ORDER — ONDANSETRON HCL 4 MG/2ML IJ SOLN
INTRAMUSCULAR | Status: DC | PRN
  Administered 2015-11-24: 4 mg via INTRAVENOUS

## 2015-11-24 NOTE — Op Note (Addendum)
Caleb [name], [address]   COLONOSCOPY PROCEDURE REPORT  PATIENT: Caleb [name], Caleb [name]  MR#: [id number] BIRTHDATE: 1935-07-31 , 80  yrs. old GENDER: male ENDOSCOPIST: R.  Garfield Cornea, MD FACP [facility] REFERRED MA:Caleb [name], M.D. PROCEDURE DATE:  12-02-2015 PROCEDURE:   Colonoscopy with snare polypectomy INDICATIONS:GI bleed; negative EGD; history of distant diverticular bleed. MEDICATIONS: Versed 6 mg IV and Demerol 50 mg IV in divided doses. Zofran 4 mg IV. ASA CLASS:       Class III  CONSENT: The risks, benefits, alternatives and imponderables including but not limited to bleeding, perforation as well as the possibility of a missed lesion have been reviewed.  The potential for biopsy, lesion removal, etc. have also been discussed. Questions have been answered.  All parties agreeable.  Please see the history and physical in the medical record for more information.  DESCRIPTION OF PROCEDURE:   After the risks benefits and alternatives of the procedure were thoroughly explained, informed consent was obtained.  The digital rectal exam revealed no abnormalities of the rectum.   The [id number] ([id number])  endoscope was introduced through the anus and advanced to the terminal ileum which was intubated for a short distance. No adverse events experienced.   The quality of the prep was adequate  The instrument was then slowly withdrawn as the colon was fully examined. Estimated blood loss is zero unless otherwise noted in this procedure report.      COLON FINDINGS: Normal-appearing rectal mucosa.  Tortuous redundant colon requiring changing of the patient's position and external abdominal pressure to reach the cecum.  Patient had diverticula from the rectosigmoid junction all the way to the cecum.  There were (2) 25m polyps in the ascending segment; otherwise, the remainder of the colonic mucosa appeared normal.  The distal 15 cm of terminal  ileal mucosa was well seen and appeared normal.  Please note there was no blood in the lower GI tract.  The ascending colon polyps were cold snare/removed.  Retroflexion was performed. .Marland KitchenEBL 11 Withdrawal time=8 minutes 0 seconds.  The scope was withdrawn and the procedure completed. COMPLICATIONS: There were no immediate complications.  ENDOSCOPIC IMPRESSION: Pancolonic diverticulosis. Colonic polyps?"removed as described above. No bleeding lesion identified although easily bled from diverticula  RECOMMENDATIONS: We'll proceed with a capsule study of his small intestine to complete the GI evaluation. I discussed this approach with the patient and the patient's son, REdd [name] via telephone ([phone]. All parties agreeable. Further recommendations to follow.  eSigned:  R. MGarfield Cornea MD FRosalita ChessmanFMarval Regal1December 16, 20162:21 PM Revised: 116-Dec-20162:21 PM  cc:  CPT CODES: ICD CODES:  The ICD and CPT codes recommended by this software are interpretations from the data that the clinical staff has captured with the software.  The verification of the translation of this report to the ICD and CPT codes and modifiers is the sole responsibility of the health care institution and practicing physician where this report was generated.  [facility] will not be held responsible for the validity of the ICD and CPT codes included on this report.  AMA assumes no liability for data contained or not contained herein. CPT is a rDesigner, television/film setof the [employer]  PATIENT NAME:  [name], CarringtonMR#: [id number]

## 2015-11-24 NOTE — Progress Notes (Signed)
Brief note. Discussed with patient and Dr. Luan Pulling. Patient is agreeable to colonoscopy. He started his prep at 530am. He has consumed 1/2 prep and no BM yet. Abdomen slightly distended. Ordered Dulcolax '10mg'$  supp X 1. Nursing staff aware.

## 2015-11-24 NOTE — Progress Notes (Signed)
Per Dr. Gala Romney- Would be helpful for patient to have family member with him during his hospital stay tonight to assist him with following instructions for the Given's capsule and following instructions for Colonoscopy After Care Instructions. No other orders at this time.

## 2015-11-24 NOTE — Progress Notes (Signed)
Previous nurse states that MD aware that pt wants to speak to MD concerning colonoscopy. Pt states that he wants to talk to doctor because another doctor told him that he should not have a colonoscopy.

## 2015-11-24 NOTE — Progress Notes (Signed)
Subjective: He says he feels a little better. Hemoglobin is pending this morning. He said for colonoscopy. He is much better as far as his pneumonia is concerned  Objective: Vital signs in last 24 hours: Temp:  [97.5 F (36.4 C)-98.5 F (36.9 C)] 97.8 F (36.6 C) (12/07 1945) Pulse Rate:  [65-92] 65 (12/08 0731) Resp:  [14-20] 20 (12/08 0731) BP: (121-156)/(56-110) 131/70 mmHg (12/08 0731) SpO2:  [82 %-99 %] 97 % (12/08 0731) Weight change:  Last BM Date: 11/23/15  Intake/Output from previous day: 12/07 0701 - 12/08 0700 In: 1725 [P.O.:480; I.V.:500; Blood:445; IV Piggyback:300] Out: 550 [Urine:550]  PHYSICAL EXAM General appearance: alert, cooperative and no distress Resp: clear to auscultation bilaterally Cardio: irregularly irregular rhythm GI: soft, non-tender; bowel sounds normal; no masses,  no organomegaly Extremities: extremities normal, atraumatic, no cyanosis or edema  Lab Results:  Results for orders placed or performed during the hospital encounter of 11/20/15 (from the past 48 hour(s))  Type and screen [facility]     Status: None   Collection Time: 11/22/15  9:50 AM  Result Value Ref Range   ABO/RH(D) O POS    Antibody Screen NEG    Sample Expiration 11/25/2015    Unit Number [id number]    Blood Component Type RED CELLS,LR    Unit division 00    Status of Unit ISSUED,FINAL    Transfusion Status OK TO TRANSFUSE    Crossmatch Result Compatible    Unit Number [id number]    Blood Component Type RED CELLS,LR    Unit division 00    Status of Unit ISSUED,FINAL    Transfusion Status OK TO TRANSFUSE    Crossmatch Result Compatible    Unit Number [id number]    Blood Component Type RBC LR PHER1    Unit division 00    Status of Unit ISSUED,FINAL    Transfusion Status OK TO TRANSFUSE    Crossmatch Result Compatible    Unit Number [id number]    Blood Component Type RED CELLS,LR    Unit division 00    Status of Unit ISSUED,FINAL     Transfusion Status OK TO TRANSFUSE    Crossmatch Result Compatible   Prepare RBC     Status: None   Collection Time: 11/22/15  9:50 AM  Result Value Ref Range   Order Confirmation ORDER PROCESSED BY BLOOD BANK   Glucose, capillary     Status: Abnormal   Collection Time: 11/22/15 11:40 AM  Result Value Ref Range   Glucose-Capillary 115 (H) 65 - 99 mg/dL  Glucose, capillary     Status: Abnormal   Collection Time: 11/22/15  4:22 PM  Result Value Ref Range   Glucose-Capillary 282 (H) 65 - 99 mg/dL   Comment 1 Notify RN    Comment 2 Document in Chart   Glucose, capillary     Status: Abnormal   Collection Time: 11/22/15  8:36 PM  Result Value Ref Range   Glucose-Capillary 241 (H) 65 - 99 mg/dL   Comment 1 Notify RN    Comment 2 Document in Chart   Hemoglobin and hematocrit, blood     Status: Abnormal   Collection Time: 11/23/15  1:40 AM  Result Value Ref Range   Hemoglobin 6.8 (LL) 13.0 - 17.0 g/dL    Comment: DELTA CHECK NOTED CRITICAL RESULT CALLED TO, READ BACK BY AND VERIFIED WITH: GAVIN B AT 0154 ON [id number] BY FORSYTH K    HCT 20.8 (L) 39.0 - 52.0 %  Protime-INR  Status: Abnormal   Collection Time: 11/23/15  6:40 AM  Result Value Ref Range   Prothrombin Time 16.5 (H) 11.6 - 15.2 seconds   INR 1.31 0.00 - 1.49  CBC with Differential/Platelet     Status: Abnormal   Collection Time: 11/23/15  6:40 AM  Result Value Ref Range   WBC 7.9 4.0 - 10.5 K/uL   RBC 2.67 (L) 4.22 - 5.81 MIL/uL   Hemoglobin 7.8 (L) 13.0 - 17.0 g/dL   HCT 23.8 (L) 39.0 - 52.0 %   MCV 89.1 78.0 - 100.0 fL   MCH 29.2 26.0 - 34.0 pg   MCHC 32.8 30.0 - 36.0 g/dL   RDW 17.2 (H) 11.5 - 15.5 %   Platelets 235 150 - 400 K/uL   Neutrophils Relative % 87 %   Neutro Abs 6.8 1.7 - 7.7 K/uL   Lymphocytes Relative 10 %   Lymphs Abs 0.8 0.7 - 4.0 K/uL   Monocytes Relative 3 %   Monocytes Absolute 0.3 0.1 - 1.0 K/uL   Eosinophils Relative 0 %   Eosinophils Absolute 0.0 0.0 - 0.7 K/uL   Basophils Relative 0 %    Basophils Absolute 0.0 0.0 - 0.1 K/uL  Glucose, capillary     Status: Abnormal   Collection Time: 11/23/15  7:33 AM  Result Value Ref Range   Glucose-Capillary 155 (H) 65 - 99 mg/dL   Comment 1 Notify RN    Comment 2 Document in Chart   Glucose, capillary     Status: Abnormal   Collection Time: 11/23/15 11:56 AM  Result Value Ref Range   Glucose-Capillary 148 (H) 65 - 99 mg/dL  Glucose, capillary     Status: Abnormal   Collection Time: 11/23/15  4:23 PM  Result Value Ref Range   Glucose-Capillary 262 (H) 65 - 99 mg/dL   Comment 1 Notify RN    Comment 2 Document in Chart   Glucose, capillary     Status: None   Collection Time: 11/23/15 10:25 PM  Result Value Ref Range   Glucose-Capillary 92 65 - 99 mg/dL   Comment 1 Notify RN    Comment 2 Document in Chart   Hemoglobin and hematocrit, blood     Status: Abnormal   Collection Time: 11/23/15 11:41 PM  Result Value Ref Range   Hemoglobin 9.4 (L) 13.0 - 17.0 g/dL   HCT 28.8 (L) 39.0 - 52.0 %  Protime-INR     Status: Abnormal   Collection Time: 11/24/15  6:05 AM  Result Value Ref Range   Prothrombin Time 15.3 (H) 11.6 - 15.2 seconds   INR 1.20 0.00 - 1.49  CBC with Differential/Platelet     Status: Abnormal   Collection Time: 11/24/15  6:05 AM  Result Value Ref Range   WBC 6.9 4.0 - 10.5 K/uL   RBC 3.60 (L) 4.22 - 5.81 MIL/uL   Hemoglobin 10.3 (L) 13.0 - 17.0 g/dL   HCT 32.4 (L) 39.0 - 52.0 %   MCV 90.0 78.0 - 100.0 fL   MCH 28.6 26.0 - 34.0 pg   MCHC 31.8 30.0 - 36.0 g/dL   RDW 17.2 (H) 11.5 - 15.5 %   Platelets 262 150 - 400 K/uL   Neutrophils Relative % 84 %   Neutro Abs 5.8 1.7 - 7.7 K/uL   Lymphocytes Relative 11 %   Lymphs Abs 0.8 0.7 - 4.0 K/uL   Monocytes Relative 5 %   Monocytes Absolute 0.4 0.1 - 1.0 K/uL  Eosinophils Relative 0 %   Eosinophils Absolute 0.0 0.0 - 0.7 K/uL   Basophils Relative 0 %   Basophils Absolute 0.0 0.0 - 0.1 K/uL  Glucose, capillary     Status: Abnormal   Collection Time: 11/24/15   7:20 AM  Result Value Ref Range   Glucose-Capillary 130 (H) 65 - 99 mg/dL   Comment 1 Notify RN    Comment 2 Document in Chart     ABGS No results for input(s): PHART, PO2ART, TCO2, HCO3 in the last 72 hours.  Invalid input(s): PCO2 CULTURES Recent Results (from the past 240 hour(s))  Blood culture (routine x 2)     Status: None (Preliminary result)   Collection Time: 11/20/15  1:26 PM  Result Value Ref Range Status   Specimen Description BLOOD LEFT ANTECUBITAL  Final   Special Requests BOTTLES DRAWN AEROBIC AND ANAEROBIC 4CC  Final   Culture NO GROWTH 3 DAYS  Final   Report Status PENDING  Incomplete  Blood culture (routine x 2)     Status: None (Preliminary result)   Collection Time: 11/20/15  1:40 PM  Result Value Ref Range Status   Specimen Description BLOOD LEFT HAND  Final   Special Requests BOTTLES DRAWN AEROBIC AND ANAEROBIC 4CC  Final   Culture NO GROWTH 3 DAYS  Final   Report Status PENDING  Incomplete  MRSA PCR Screening     Status: Abnormal   Collection Time: 11/21/15  4:30 PM  Result Value Ref Range Status   MRSA by PCR POSITIVE (A) NEGATIVE Final    Comment:        The GeneXpert MRSA Assay (FDA approved for NASAL specimens only), is one component of a comprehensive MRSA colonization surveillance program. It is not intended to diagnose MRSA infection nor to guide or monitor treatment for MRSA infections. RESULT CALLED TO, READ BACK BY AND VERIFIED WITH: Arvil Persons AT 2036 [id number] BY FORSYTH K   Culture, sputum-assessment     Status: None   Collection Time: 11/21/15  4:56 PM  Result Value Ref Range Status   Specimen Description SPUTUM EXPECTORATED  Final   Special Requests NONE  Final   Sputum evaluation   Final    THIS SPECIMEN IS ACCEPTABLE. RESPIRATORY CULTURE REPORT TO FOLLOW. Performed at [facility]    Report Status 11/21/2015 FINAL  Final  Culture, respiratory (NON-Expectorated)     Status: None (Preliminary result)   Collection Time:  11/21/15  4:56 PM  Result Value Ref Range Status   Specimen Description SPUTUM EXPECTORATED  Final   Special Requests NONE  Final   Gram Stain   Final    ABUNDANT WBC PRESENT, PREDOMINANTLY PMN FEW SQUAMOUS EPITHELIAL CELLS PRESENT FEW GRAM POSITIVE COCCI IN PAIRS RARE GRAM POSITIVE RODS Performed at [facility]    Culture   Final    Culture reincubated for better growth Performed at [facility]    Report Status PENDING  Incomplete   Studies/Results: No results found.  Medications:  Prior to Admission:  Prescriptions prior to admission  Medication Sig Dispense Refill Last Dose  . allopurinol (ZYLOPRIM) 300 MG tablet Take 300 mg by mouth daily.   11/20/2015 at Unknown time  . amLODipine (NORVASC) 5 MG tablet Take 1 tablet (5 mg total) by mouth daily. 180 tablet 3 11/20/2015 at Unknown time  . Ascorbic Acid (VITAMIN C) 500 MG tablet Take 500 mg by mouth 2 (two) times daily.    11/20/2015 at Unknown time  .  aspirin EC 81 MG tablet Take 81 mg by mouth daily.   11/20/2015 at Unknown time  . cholecalciferol (VITAMIN D) 1000 UNITS tablet Take 4,000 Units by mouth daily.   11/20/2015 at Unknown time  . clobetasol cream (TEMOVATE) 7.40 % Apply 1 application topically 2 (two) times daily as needed (Dry Skin).    Past Week at Unknown time  . diltiazem (DILACOR XR) 240 MG 24 hr capsule Take 240 mg by mouth daily.     11/20/2015 at Unknown time  . docusate sodium (COLACE) 100 MG capsule Take 300 mg by mouth 2 (two) times daily.   11/20/2015 at Unknown time  . ferrous fumarate (HEMOCYTE - 106 MG FE) 325 (106 FE) MG TABS tablet Take 1 tablet by mouth 2 (two) times daily.   11/20/2015 at Unknown time  . finasteride (PROSCAR) 5 MG tablet Take 5 mg by mouth daily.     11/20/2015 at Unknown time  . folic acid (FOLVITE) 1 MG tablet Take 1 mg by mouth daily.   11/20/2015 at Unknown time  . galantamine (RAZADYNE) 8 MG tablet Take 8 mg by mouth 2 (two) times daily.     11/20/2015 at Unknown time   . glyBURIDE (DIABETA) 5 MG tablet Take 5-10 mg by mouth 2 (two) times daily with a meal. 2 in the morning and 1 tab at night   11/20/2015 at Unknown time  . guaiFENesin 200 MG tablet Take 400 mg by mouth 2 (two) times daily.   11/20/2015 at Unknown time  . Insulin Glargine (LANTUS SOLOSTAR) 100 UNIT/ML Solostar Pen Inject 30 Units into the skin daily at 10 pm.   11/19/2015 at Unknown time  . levothyroxine (SYNTHROID, LEVOTHROID) 112 MCG tablet Take 124 mcg by mouth at bedtime.   11/19/2015 at Unknown time  . lisinopril (PRINIVIL,ZESTRIL) 20 MG tablet Take 10 mg by mouth daily.   11/20/2015 at Unknown time  . loratadine (CLARITIN) 10 MG tablet Take 10 mg by mouth daily.     11/20/2015 at Unknown time  . magnesium oxide (MAG-OX) 400 MG tablet Take 400 mg by mouth 2 (two) times daily.    11/20/2015 at Unknown time  . Multiple Vitamin (MULTIVITAMIN WITH MINERALS) TABS tablet Take 1 tablet by mouth daily.   11/20/2015 at Unknown time  . omeprazole (PRILOSEC) 20 MG capsule Take 20 mg by mouth 2 (two) times daily.     11/20/2015 at Unknown time  . ondansetron (ZOFRAN) 4 MG tablet Take 1 tablet by mouth every 4 (four) hours as needed for nausea or vomiting.    11/20/2015 at Unknown time  . oxybutynin (DITROPAN) 5 MG tablet Take 5 mg by mouth 2 (two) times daily.    11/20/2015 at Unknown time  . rosuvastatin (CRESTOR) 20 MG tablet Take 1 tablet (20 mg total) by mouth daily. 90 tablet 3 11/20/2015 at Unknown time  . warfarin (COUMADIN) 5 MG tablet Take 5 mg by mouth at bedtime.    11/19/2015 at 1800  . [DISCONTINUED] metroNIDAZOLE (FLAGYL) 500 MG tablet Take 1 tablet by mouth 3 (three) times daily. Starting 11/07/2015 x 7 days.   Completed Course at Unknown time   Scheduled: . sodium chloride   Intravenous Once  . sodium chloride   Intravenous Once  . allopurinol  300 mg Oral Daily  . aspirin EC  81 mg Oral Daily  . azithromycin (ZITHROMAX) 500 MG IVPB  500 mg Intravenous Once  . azithromycin  500 mg Intravenous  Q24H  .  bisacodyl  10 mg Rectal Once  . cefTRIAXone (ROCEPHIN)  IV  1 g Intravenous Q24H  . Chlorhexidine Gluconate Cloth  6 each Topical Q0600  . clobetasol ointment   Topical BID  . docusate sodium  300 mg Oral BID  . finasteride  5 mg Oral Daily  . folic acid  1 mg Oral Daily  . galantamine  8 mg Oral BID  . guaiFENesin  600 mg Oral BID  . Influenza vac split quadrivalent PF  0.5 mL Intramuscular Tomorrow-1000  . insulin aspart  0-15 Units Subcutaneous TID WC  . insulin aspart  0-5 Units Subcutaneous QHS  . insulin glargine  30 Units Subcutaneous Q2200  . ipratropium-albuterol  3 mL Nebulization TID  . levothyroxine  125 mcg Oral QAC breakfast  . loratadine  10 mg Oral Daily  . magnesium oxide  400 mg Oral BID  . methylPREDNISolone (SOLU-MEDROL) injection  40 mg Intravenous Q12H  . mupirocin ointment  1 application Nasal BID  . oxybutynin  5 mg Oral BID  . pantoprazole  40 mg Oral BID AC  . polyethylene glycol  2,000 mL Oral Once  . rosuvastatin  20 mg Oral Daily  . vitamin C  500 mg Oral BID   Continuous: . sodium chloride 100 mL/hr at 11/21/15 2209   OIZ:[id number], traMADol  Assesment: He was admitted with community-acquired pneumonia. This has improved. He developed GI bleeding and has acute blood loss anemia. He had upper endoscopy yesterday which did not show a definite cause of his bleeding so he is going to have colonoscopy today. Active Problems:   Atrial fibrillation (HCC)   CAP (community acquired pneumonia)   COPD (chronic obstructive pulmonary disease) ([place])   Diabetes mellitus without complication (HCC)   Sepsis ([place])   Acute blood loss anemia   Melena   Hiatal hernia   Duodenal diverticulum   Dysphagia    Plan: Colonoscopy today. CBC is pending. He may need more blood depending on the results.    LOS: 4 days   Korah Hufstedler L 11/24/2015, 9:05 AM

## 2015-11-24 NOTE — Consult Note (Signed)
   [facility] CM Inpatient Consult   11/24/2015  Caleb SLAYMAKER Sr. Nov 24, 1935 [id number]   Spoke with patient at bedside regarding [facility] services. Patient does not want to participate with [facility] at this time. Patient given [facility] brochure and contact information for future reference.  Of note, [facility] Care Management services would not replace or interfere with any services that are arranged by inpatient case management or social work. For additional questions or referrals please contact:  Royetta Crochet. Laymond Purser, RN, BSN, [facility] Liaison [phone]

## 2015-11-25 ENCOUNTER — Telehealth: Payer: Self-pay | Admitting: Nurse Practitioner

## 2015-11-25 ENCOUNTER — Encounter (HOSPITAL_COMMUNITY): Payer: Self-pay | Admitting: Internal Medicine

## 2015-11-25 DIAGNOSIS — Z86010 Personal history of colonic polyps: Secondary | ICD-10-CM

## 2015-11-25 DIAGNOSIS — R1310 Dysphagia, unspecified: Secondary | ICD-10-CM | POA: Diagnosis not present

## 2015-11-25 DIAGNOSIS — K5710 Diverticulosis of small intestine without perforation or abscess without bleeding: Secondary | ICD-10-CM

## 2015-11-25 DIAGNOSIS — D62 Acute posthemorrhagic anemia: Secondary | ICD-10-CM

## 2015-11-25 DIAGNOSIS — K921 Melena: Secondary | ICD-10-CM

## 2015-11-25 DIAGNOSIS — K449 Diaphragmatic hernia without obstruction or gangrene: Secondary | ICD-10-CM

## 2015-11-25 DIAGNOSIS — K5730 Diverticulosis of large intestine without perforation or abscess without bleeding: Secondary | ICD-10-CM | POA: Diagnosis not present

## 2015-11-25 LAB — CBC WITH DIFFERENTIAL/PLATELET
BASOS PCT: 0 %
Basophils Absolute: 0 10*3/uL (ref 0.0–0.1)
EOS ABS: 0 10*3/uL (ref 0.0–0.7)
EOS PCT: 0 %
HCT: 29.6 % — ABNORMAL LOW (ref 39.0–52.0)
Hemoglobin: 9.4 g/dL — ABNORMAL LOW (ref 13.0–17.0)
LYMPHS ABS: 0.8 10*3/uL (ref 0.7–4.0)
Lymphocytes Relative: 16 %
MCH: 28.8 pg (ref 26.0–34.0)
MCHC: 31.8 g/dL (ref 30.0–36.0)
MCV: 90.8 fL (ref 78.0–100.0)
Monocytes Absolute: 0.4 10*3/uL (ref 0.1–1.0)
Monocytes Relative: 7 %
Neutro Abs: 4.1 10*3/uL (ref 1.7–7.7)
Neutrophils Relative %: 77 %
PLATELETS: 213 10*3/uL (ref 150–400)
RBC: 3.26 MIL/uL — ABNORMAL LOW (ref 4.22–5.81)
RDW: 16.6 % — AB (ref 11.5–15.5)
WBC: 5.3 10*3/uL (ref 4.0–10.5)

## 2015-11-25 LAB — GLUCOSE, CAPILLARY
GLUCOSE-CAPILLARY: 107 mg/dL — AB (ref 65–99)
GLUCOSE-CAPILLARY: 124 mg/dL — AB (ref 65–99)
GLUCOSE-CAPILLARY: 219 mg/dL — AB (ref 65–99)
Glucose-Capillary: 227 mg/dL — ABNORMAL HIGH (ref 65–99)

## 2015-11-25 LAB — PROTIME-INR
INR: 1.18 (ref 0.00–1.49)
PROTHROMBIN TIME: 15.2 s (ref 11.6–15.2)

## 2015-11-25 MED ORDER — PREDNISONE 20 MG PO TABS
40.0000 mg | ORAL_TABLET | Freq: Every day | ORAL | Status: DC
  Administered 2015-11-25 – 2015-11-26 (×2): 40 mg via ORAL
  Filled 2015-11-25 (×2): qty 2

## 2015-11-25 MED ORDER — SULFAMETHOXAZOLE-TRIMETHOPRIM 800-160 MG PO TABS
1.0000 | ORAL_TABLET | Freq: Two times a day (BID) | ORAL | Status: DC
  Administered 2015-11-25 – 2015-11-26 (×3): 1 via ORAL
  Filled 2015-11-25 (×3): qty 1

## 2015-11-25 MED ORDER — PREDNISONE 10 MG PO TABS: 40.0000 mg | ORAL_TABLET | Freq: Every day | ORAL | Status: DC

## 2015-11-25 MED ORDER — SULFAMETHOXAZOLE-TRIMETHOPRIM 800-160 MG PO TABS: 1.0000 | ORAL_TABLET | Freq: Two times a day (BID) | ORAL | Status: DC

## 2015-11-25 NOTE — Op Note (Signed)
Small Bowel Givens Capsule Study Procedure date:  11/24/15 (completed 11/25/15)  Referring Provider:  Dr. Gala Romney PCP:  Dr. Alonza Bogus, MD  Indication for procedure:  GI bleed with unremarkable colonoscopy and EGD  Patient data:  Wt: 188 lb Ht: '5\' 9"'$   Findings:  Appears to be an incomplete study. Possible gastric erosion vs scope trauma 00:22:00, possible tiny erosion 03:32:53.  First Gastric image:  00:07:07 First Duodenal image: 00:22:26 First Ileo-Cecal Valve image: N/A First Cecal image: N/A Gastric Passage time: 00 h 15 m Small Bowel Passage time:  >8 h 10 m  Summary & Recommendations: Unremarkable colonoscopy and endoscopy. Likely an incomplete study as the last image of the study still shows mucosal villi consistent with small bowel as well as large amount of debris. However, unlikely to see fecal material at entry to cecum due to colonoscopy just prior to Givens capsule.  No large or obvious bleeding lesions noted, no old or fresh blood noted. Will arrange for KUB beginning of the week to confirm capsule passage.   Walden Field, AGNP-C Adult & Gerontological Nurse Practitioner [facility]

## 2015-11-25 NOTE — Care Management Note (Signed)
Case Management Note  Patient Details  Name: Caleb REELS Sr. MRN: [id number] Date of Birth: 15-Sep-1935  Subjective/Objective:                    Action/Plan:   Expected Discharge Date:                  Expected Discharge Plan:  Home/Self Care  In-House Referral:  NA  Discharge planning Services  CM Consult  Post Acute Care Choice:  NA Choice offered to:  NA  DME Arranged:    DME Agency:     HH Arranged:    HH Agency:     Status of Service:  Completed, signed off  Medicare Important Message Given:  Yes Date Medicare IM Given:    Medicare IM give by:    Date Additional Medicare IM Given:    Additional Medicare Important Message give by:     If discussed at [place] of Stay Meetings, dates discussed:    Additional Comments: Anticipate discharge within 24 hours. Pt refuses any home health involvement at this time since pt is traveling to [place] at Christmas. No further CM needs at this time. Pts nurse aware of discharge arrangements. Christinia Gully [place], RN 11/25/2015, 10:11 AM

## 2015-11-25 NOTE — Progress Notes (Signed)
Subjective: He says he feels great and wants to go home. He had given  capsule started yesterday. No further bleeding. His breathing is doing much better  Objective: Vital signs in last 24 hours: Temp:  [97.5 F (36.4 C)-97.7 F (36.5 C)] 97.5 F (36.4 C) (12/09 0537) Pulse Rate:  [69-84] 69 (12/08 2139) Resp:  [15-26] 18 (12/09 0537) BP: (109-148)/(63-90) 124/69 mmHg (12/09 0537) SpO2:  [93 %-100 %] 95 % (12/09 0732) Weight:  [85.276 kg (188 lb)] 85.276 kg (188 lb) (12/08 1249) Weight change:  Last BM Date: 11/24/15  Intake/Output from previous day: 12/08 0701 - 12/09 0700 In: 2508.3 [P.O.:960; I.V.:1498.3; IV Piggyback:50] Out: -   PHYSICAL EXAM General appearance: alert, cooperative and no distress Resp: rhonchi bilaterally Cardio: irregularly irregular rhythm GI: soft, non-tender; bowel sounds normal; no masses,  no organomegaly Extremities: extremities normal, atraumatic, no cyanosis or edema  Lab Results:  Results for orders placed or performed during the hospital encounter of 11/20/15 (from the past 48 hour(s))  Glucose, capillary     Status: Abnormal   Collection Time: 11/23/15 11:56 AM  Result Value Ref Range   Glucose-Capillary 148 (H) 65 - 99 mg/dL  Glucose, capillary     Status: Abnormal   Collection Time: 11/23/15  4:23 PM  Result Value Ref Range   Glucose-Capillary 262 (H) 65 - 99 mg/dL   Comment 1 Notify RN    Comment 2 Document in Chart   Glucose, capillary     Status: None   Collection Time: 11/23/15 10:25 PM  Result Value Ref Range   Glucose-Capillary 92 65 - 99 mg/dL   Comment 1 Notify RN    Comment 2 Document in Chart   Hemoglobin and hematocrit, blood     Status: Abnormal   Collection Time: 11/23/15 11:41 PM  Result Value Ref Range   Hemoglobin 9.4 (L) 13.0 - 17.0 g/dL   HCT 28.8 (L) 39.0 - 52.0 %  Protime-INR     Status: Abnormal   Collection Time: 11/24/15  6:05 AM  Result Value Ref Range   Prothrombin Time 15.3 (H) 11.6 - 15.2 seconds    INR 1.20 0.00 - 1.49  CBC with Differential/Platelet     Status: Abnormal   Collection Time: 11/24/15  6:05 AM  Result Value Ref Range   WBC 6.9 4.0 - 10.5 K/uL   RBC 3.60 (L) 4.22 - 5.81 MIL/uL   Hemoglobin 10.3 (L) 13.0 - 17.0 g/dL   HCT 32.4 (L) 39.0 - 52.0 %   MCV 90.0 78.0 - 100.0 fL   MCH 28.6 26.0 - 34.0 pg   MCHC 31.8 30.0 - 36.0 g/dL   RDW 17.2 (H) 11.5 - 15.5 %   Platelets 262 150 - 400 K/uL   Neutrophils Relative % 84 %   Neutro Abs 5.8 1.7 - 7.7 K/uL   Lymphocytes Relative 11 %   Lymphs Abs 0.8 0.7 - 4.0 K/uL   Monocytes Relative 5 %   Monocytes Absolute 0.4 0.1 - 1.0 K/uL   Eosinophils Relative 0 %   Eosinophils Absolute 0.0 0.0 - 0.7 K/uL   Basophils Relative 0 %   Basophils Absolute 0.0 0.0 - 0.1 K/uL  Glucose, capillary     Status: Abnormal   Collection Time: 11/24/15  7:20 AM  Result Value Ref Range   Glucose-Capillary 130 (H) 65 - 99 mg/dL   Comment 1 Notify RN    Comment 2 Document in Chart   Glucose, capillary  Status: None   Collection Time: 11/24/15 11:34 AM  Result Value Ref Range   Glucose-Capillary 82 65 - 99 mg/dL   Comment 1 Notify RN    Comment 2 Document in Chart   Glucose, capillary     Status: None   Collection Time: 11/24/15 12:44 PM  Result Value Ref Range   Glucose-Capillary 95 65 - 99 mg/dL  Glucose, capillary     Status: Abnormal   Collection Time: 11/24/15  4:30 PM  Result Value Ref Range   Glucose-Capillary 127 (H) 65 - 99 mg/dL   Comment 1 Notify RN    Comment 2 Document in Chart   Glucose, capillary     Status: None   Collection Time: 11/24/15  9:38 PM  Result Value Ref Range   Glucose-Capillary 98 65 - 99 mg/dL   Comment 1 Notify RN    Comment 2 Document in Chart   Protime-INR     Status: None   Collection Time: 11/25/15  6:11 AM  Result Value Ref Range   Prothrombin Time 15.2 11.6 - 15.2 seconds   INR 1.18 0.00 - 1.49  CBC with Differential/Platelet     Status: Abnormal   Collection Time: 11/25/15  6:11 AM  Result  Value Ref Range   WBC 5.3 4.0 - 10.5 K/uL   RBC 3.26 (L) 4.22 - 5.81 MIL/uL   Hemoglobin 9.4 (L) 13.0 - 17.0 g/dL   HCT 29.6 (L) 39.0 - 52.0 %   MCV 90.8 78.0 - 100.0 fL   MCH 28.8 26.0 - 34.0 pg   MCHC 31.8 30.0 - 36.0 g/dL   RDW 16.6 (H) 11.5 - 15.5 %   Platelets 213 150 - 400 K/uL   Neutrophils Relative % 77 %   Neutro Abs 4.1 1.7 - 7.7 K/uL   Lymphocytes Relative 16 %   Lymphs Abs 0.8 0.7 - 4.0 K/uL   Monocytes Relative 7 %   Monocytes Absolute 0.4 0.1 - 1.0 K/uL   Eosinophils Relative 0 %   Eosinophils Absolute 0.0 0.0 - 0.7 K/uL   Basophils Relative 0 %   Basophils Absolute 0.0 0.0 - 0.1 K/uL  Glucose, capillary     Status: Abnormal   Collection Time: 11/25/15  7:59 AM  Result Value Ref Range   Glucose-Capillary 107 (H) 65 - 99 mg/dL   Comment 1 Notify RN    Comment 2 Document in Chart     ABGS No results for input(s): PHART, PO2ART, TCO2, HCO3 in the last 72 hours.  Invalid input(s): PCO2 CULTURES Recent Results (from the past 240 hour(s))  Blood culture (routine x 2)     Status: None (Preliminary result)   Collection Time: 11/20/15  1:26 PM  Result Value Ref Range Status   Specimen Description BLOOD LEFT ANTECUBITAL  Final   Special Requests BOTTLES DRAWN AEROBIC AND ANAEROBIC 4CC  Final   Culture NO GROWTH 4 DAYS  Final   Report Status PENDING  Incomplete  Blood culture (routine x 2)     Status: None (Preliminary result)   Collection Time: 11/20/15  1:40 PM  Result Value Ref Range Status   Specimen Description BLOOD LEFT HAND  Final   Special Requests BOTTLES DRAWN AEROBIC AND ANAEROBIC 4CC  Final   Culture NO GROWTH 4 DAYS  Final   Report Status PENDING  Incomplete  MRSA PCR Screening     Status: Abnormal   Collection Time: 11/21/15  4:30 PM  Result Value Ref Range Status  MRSA by PCR POSITIVE (A) NEGATIVE Final    Comment:        The GeneXpert MRSA Assay (FDA approved for NASAL specimens only), is one component of a comprehensive MRSA  colonization surveillance program. It is not intended to diagnose MRSA infection nor to guide or monitor treatment for MRSA infections. RESULT CALLED TO, READ BACK BY AND VERIFIED WITH: Arvil Persons AT 2036 [id number] BY FORSYTH K   Culture, sputum-assessment     Status: None   Collection Time: 11/21/15  4:56 PM  Result Value Ref Range Status   Specimen Description SPUTUM EXPECTORATED  Final   Special Requests NONE  Final   Sputum evaluation   Final    THIS SPECIMEN IS ACCEPTABLE. RESPIRATORY CULTURE REPORT TO FOLLOW. Performed at [facility]    Report Status 11/21/2015 FINAL  Final  Culture, respiratory (NON-Expectorated)     Status: None (Preliminary result)   Collection Time: 11/21/15  4:56 PM  Result Value Ref Range Status   Specimen Description SPUTUM EXPECTORATED  Final   Special Requests NONE  Final   Gram Stain   Final    ABUNDANT WBC PRESENT, PREDOMINANTLY PMN FEW SQUAMOUS EPITHELIAL CELLS PRESENT FEW GRAM POSITIVE COCCI IN PAIRS RARE GRAM POSITIVE RODS Performed at [facility]    Culture   Final    FEW PSEUDOMONAS AERUGINOSA FEW STAPHYLOCOCCUS AUREUS Note: RIFAMPIN AND GENTAMICIN SHOULD NOT BE USED AS SINGLE DRUGS FOR TREATMENT OF STAPH INFECTIONS. Performed at [facility]    Report Status PENDING  Incomplete   Studies/Results: No results found.  Medications:  Prior to Admission:  Prescriptions prior to admission  Medication Sig Dispense Refill Last Dose  . allopurinol (ZYLOPRIM) 300 MG tablet Take 300 mg by mouth daily.   11/20/2015 at Unknown time  . amLODipine (NORVASC) 5 MG tablet Take 1 tablet (5 mg total) by mouth daily. 180 tablet 3 11/20/2015 at Unknown time  . Ascorbic Acid (VITAMIN C) 500 MG tablet Take 500 mg by mouth 2 (two) times daily.    11/20/2015 at Unknown time  . aspirin EC 81 MG tablet Take 81 mg by mouth daily.   11/20/2015 at Unknown time  . cholecalciferol (VITAMIN D) 1000 UNITS tablet Take 4,000 Units by mouth  daily.   11/20/2015 at Unknown time  . clobetasol cream (TEMOVATE) 0.16 % Apply 1 application topically 2 (two) times daily as needed (Dry Skin).    Past Week at Unknown time  . diltiazem (DILACOR XR) 240 MG 24 hr capsule Take 240 mg by mouth daily.     11/20/2015 at Unknown time  . docusate sodium (COLACE) 100 MG capsule Take 300 mg by mouth 2 (two) times daily.   11/20/2015 at Unknown time  . ferrous fumarate (HEMOCYTE - 106 MG FE) 325 (106 FE) MG TABS tablet Take 1 tablet by mouth 2 (two) times daily.   11/20/2015 at Unknown time  . finasteride (PROSCAR) 5 MG tablet Take 5 mg by mouth daily.     11/20/2015 at Unknown time  . folic acid (FOLVITE) 1 MG tablet Take 1 mg by mouth daily.   11/20/2015 at Unknown time  . galantamine (RAZADYNE) 8 MG tablet Take 8 mg by mouth 2 (two) times daily.     11/20/2015 at Unknown time  . glyBURIDE (DIABETA) 5 MG tablet Take 5-10 mg by mouth 2 (two) times daily with a meal. 2 in the morning and 1 tab at night   11/20/2015 at Unknown time  .  guaiFENesin 200 MG tablet Take 400 mg by mouth 2 (two) times daily.   11/20/2015 at Unknown time  . Insulin Glargine (LANTUS SOLOSTAR) 100 UNIT/ML Solostar Pen Inject 30 Units into the skin daily at 10 pm.   11/19/2015 at Unknown time  . levothyroxine (SYNTHROID, LEVOTHROID) 112 MCG tablet Take 124 mcg by mouth at bedtime.   11/19/2015 at Unknown time  . lisinopril (PRINIVIL,ZESTRIL) 20 MG tablet Take 10 mg by mouth daily.   11/20/2015 at Unknown time  . loratadine (CLARITIN) 10 MG tablet Take 10 mg by mouth daily.     11/20/2015 at Unknown time  . magnesium oxide (MAG-OX) 400 MG tablet Take 400 mg by mouth 2 (two) times daily.    11/20/2015 at Unknown time  . Multiple Vitamin (MULTIVITAMIN WITH MINERALS) TABS tablet Take 1 tablet by mouth daily.   11/20/2015 at Unknown time  . omeprazole (PRILOSEC) 20 MG capsule Take 20 mg by mouth 2 (two) times daily.     11/20/2015 at Unknown time  . ondansetron (ZOFRAN) 4 MG tablet Take 1 tablet by mouth  every 4 (four) hours as needed for nausea or vomiting.    11/20/2015 at Unknown time  . oxybutynin (DITROPAN) 5 MG tablet Take 5 mg by mouth 2 (two) times daily.    11/20/2015 at Unknown time  . rosuvastatin (CRESTOR) 20 MG tablet Take 1 tablet (20 mg total) by mouth daily. 90 tablet 3 11/20/2015 at Unknown time  . warfarin (COUMADIN) 5 MG tablet Take 5 mg by mouth at bedtime.    11/19/2015 at 1800  . [DISCONTINUED] metroNIDAZOLE (FLAGYL) 500 MG tablet Take 1 tablet by mouth 3 (three) times daily. Starting 11/07/2015 x 7 days.   Completed Course at Unknown time   Scheduled: . sodium chloride   Intravenous Once  . allopurinol  300 mg Oral Daily  . aspirin EC  81 mg Oral Daily  . azithromycin (ZITHROMAX) 500 MG IVPB  500 mg Intravenous Once  . azithromycin  500 mg Intravenous Q24H  . cefTRIAXone (ROCEPHIN)  IV  1 g Intravenous Q24H  . Chlorhexidine Gluconate Cloth  6 each Topical Q0600  . clobetasol ointment   Topical BID  . docusate sodium  300 mg Oral BID  . finasteride  5 mg Oral Daily  . folic acid  1 mg Oral Daily  . galantamine  8 mg Oral BID  . guaiFENesin  600 mg Oral BID  . Influenza vac split quadrivalent PF  0.5 mL Intramuscular Tomorrow-1000  . insulin aspart  0-15 Units Subcutaneous TID WC  . insulin aspart  0-5 Units Subcutaneous QHS  . insulin glargine  30 Units Subcutaneous Q2200  . ipratropium-albuterol  3 mL Nebulization BID  . levothyroxine  125 mcg Oral QAC breakfast  . loratadine  10 mg Oral Daily  . magnesium oxide  400 mg Oral BID  . methylPREDNISolone (SOLU-MEDROL) injection  40 mg Intravenous Q12H  . mupirocin ointment  1 application Nasal BID  . oxybutynin  5 mg Oral BID  . pantoprazole  40 mg Oral BID AC  . polyethylene glycol  2,000 mL Oral Once  . rosuvastatin  20 mg Oral Daily  . vitamin C  500 mg Oral BID   Continuous: . sodium chloride 100 mL/hr at 11/25/15 5784   ONG:[id number], traMADol  Assesment: He came in with pneumonia and that has  improved. He had GI bleeding and workup is underway but he has not had a definite source seen yet. He  has been chronically anticoagulated because of atrial fibrillation. He is improving as far as his pneumonia and he does have COPD at baseline and that's about at baseline. Active Problems:   Atrial fibrillation (HCC)   CAP (community acquired pneumonia)   COPD (chronic obstructive pulmonary disease) (HCC)   Diabetes mellitus without complication (HCC)   Sepsis ([place])   Acute blood loss anemia   Melena   Hiatal hernia   Duodenal diverticulum   Dysphagia   History of colonic polyps   Diverticulosis of colon without hemorrhage    Plan: Switch him to oral medications as far as his pneumonia is concerned. Discussed with GI about the capsule study    LOS: 5 days   Darbi Chandran L 11/25/2015, 8:48 AM

## 2015-11-25 NOTE — Telephone Encounter (Signed)
Please order a KUB Xray for this patient to check for passage of Givens Capsule and call the patient to notify him to have the XRay done. Should be done beginning of the week.

## 2015-11-25 NOTE — Care Management Important Message (Signed)
Important Message  Patient Details  Name: Caleb ROGGENKAMP Sr. MRN: [id number] Date of Birth: Nov 15, 1935   Medicare Important Message Given:  Yes    Joylene Draft, RN 11/25/2015, 10:10 AM

## 2015-11-25 NOTE — Progress Notes (Signed)
    Subjective: Difficulty history due to preoccupation with THN brocure. Denies further GI bleed, RN staff confirms no noted GI bleed. Abdominal pain remains but is chronic for him and no worse today. Wants to eat solid food. Givens reader removed this morning.   Objective: Vital signs in last 24 hours: Temp:  [97.5 F (36.4 C)-97.7 F (36.5 C)] 97.5 F (36.4 C) (12/09 0537) Pulse Rate:  [69-84] 69 (12/08 2139) Resp:  [15-26] 18 (12/09 0537) BP: (109-148)/(63-90) 124/69 mmHg (12/09 0537) SpO2:  [93 %-100 %] 95 % (12/09 0732) Weight:  [188 lb (85.276 kg)] 188 lb (85.276 kg) (12/08 1249) Last BM Date: 11/24/15 General:   Alert and oriented Head:  Normocephalic and atraumatic. Eyes:  No icterus, sclera clear. Conjuctiva pink.  Heart:  S1, S2 present, no murmurs noted.  Lungs: Clear to auscultation bilaterally, without wheezing, rales, or rhonchi.  Abdomen:  Bowel sounds present, soft, non-tender, non-distended. No rebound or guarding. Extremities:  Without clubbing or edema. Neurologic:  Alert and  oriented x4; grossly normal neurologically. Psych:  Alert and cooperative. Normal mood and affect.  Intake/Output from previous day: 12/08 0701 - 12/09 0700 In: 2508.3 [P.O.:960; I.V.:1498.3; IV Piggyback:50] Out: -  Intake/Output this shift:    Lab Results:  Recent Labs  11/23/15 0640 11/23/15 2341 11/24/15 0605 11/25/15 0611  WBC 7.9  --  6.9 5.3  HGB 7.8* 9.4* 10.3* 9.4*  HCT 23.8* 28.8* 32.4* 29.6*  PLT 235  --  262 213   BMET No results for input(s): NA, K, CL, CO2, GLUCOSE, BUN, CREATININE, CALCIUM in the last 72 hours. LFT No results for input(s): PROT, ALBUMIN, AST, ALT, ALKPHOS, BILITOT, BILIDIR, IBILI in the last 72 hours. PT/INR  Recent Labs  11/24/15 0605 11/25/15 0611  LABPROT 15.3* 15.2  INR 1.20 1.18   Hepatitis Panel No results for input(s): HEPBSAG, HCVAB, HEPAIGM, HEPBIGM in the last 72 hours.   Studies/Results: No results  found.  Assessment: 80 year old male admitted with community-acquired pneumonia, after admission with evidence of melena and acute blood loss anemia.On Coumadin prior to admission for afib, with Coumadin last taken likely on 12/3 prior to admission: he has not received any Coumadin while inpatient as he was supratherapeutic on admission. Noted vague esophageal solid food dysphagia and intermittent NSAID use when initially evaluated by our service. Underwent EGD on 12/7 which found Normal-appearing esophagus status post [facility] dilation. Hiatal hernia. Duodenal diverticulum. No explanation for GI bleeding found. Follow-up colonoscopy on 12/8 found Pancolonic diverticulosis. Two 50m ascending colon polyps status post removal. No bleeding lesion identified although easily bled from diverticula. Subsequently a Givens capsule was placed to complete GI evaluation.  Continued off coumadin and INR today 1.18. Hgb decreased today to 9.4 from 10.3 yesterday possibly residual from previous GI bleed requiring 4 units PRBC. EGD/TCS unremarkable except for diverticulosis query diverticular bleed. Capsule study to complete GI evaluation was completed this morning. No further symptoms of GI bleed, although Hgb continues to trend down. Wants to go home, wants to eat solid food. Clinically improved today, feels better today.   Plan: 1. Continue to monitor for further GI bleed 2. Follow H/H 3. Will need outpatient GI follow-up upon discharge along with ER precautions for any further GI bleed 4. Advance diet as tolerated   EWalden Field AGNP-C Adult & Gerontological Nurse Practitioner [facility]      LOS: 5 days    11/25/2015, 9:55 AM

## 2015-11-26 LAB — PROTIME-INR
INR: 1.1 (ref 0.00–1.49)
PROTHROMBIN TIME: 14.4 s (ref 11.6–15.2)

## 2015-11-26 LAB — CBC WITH DIFFERENTIAL/PLATELET
BAND NEUTROPHILS: 0 %
BASOS PCT: 1 %
Basophils Absolute: 0.1 10*3/uL (ref 0.0–0.1)
Blasts: 0 %
Eosinophils Absolute: 0 10*3/uL (ref 0.0–0.7)
Eosinophils Relative: 0 %
HCT: 30.4 % — ABNORMAL LOW (ref 39.0–52.0)
HEMOGLOBIN: 9.6 g/dL — AB (ref 13.0–17.0)
LYMPHS ABS: 1.6 10*3/uL (ref 0.7–4.0)
Lymphocytes Relative: 31 %
MCH: 28.5 pg (ref 26.0–34.0)
MCHC: 31.6 g/dL (ref 30.0–36.0)
MCV: 90.2 fL (ref 78.0–100.0)
METAMYELOCYTES PCT: 0 %
MYELOCYTES: 0 %
Monocytes Absolute: 0.4 10*3/uL (ref 0.1–1.0)
Monocytes Relative: 7 %
NEUTROS ABS: 3.1 10*3/uL (ref 1.7–7.7)
NEUTROS PCT: 61 %
NRBC: 0 /100{WBCs}
OTHER: 0 %
PLATELETS: 233 10*3/uL (ref 150–400)
Promyelocytes Absolute: 0 %
RBC: 3.37 MIL/uL — AB (ref 4.22–5.81)
RDW: 15.9 % — AB (ref 11.5–15.5)
WBC: 5.2 10*3/uL (ref 4.0–10.5)

## 2015-11-26 LAB — CULTURE, BLOOD (ROUTINE X 2)
CULTURE: NO GROWTH
Culture: NO GROWTH

## 2015-11-26 LAB — CULTURE, RESPIRATORY W GRAM STAIN

## 2015-11-26 LAB — CULTURE, RESPIRATORY

## 2015-11-26 LAB — GLUCOSE, CAPILLARY
GLUCOSE-CAPILLARY: 58 mg/dL — AB (ref 65–99)
Glucose-Capillary: 115 mg/dL — ABNORMAL HIGH (ref 65–99)

## 2015-11-26 MED ORDER — WARFARIN SODIUM 5 MG PO TABS: 5.0000 mg | ORAL_TABLET | Freq: Every day | ORAL | Status: DC

## 2015-11-26 NOTE — Discharge Summary (Signed)
Physician Discharge Summary  Patient ID: Caleb DENNARD Sr. MRN: [id number] DOB/AGE: [birth year] 80 y.o. Primary Care Physician:Sabreena Vogan L, MD Admit date: 11/20/2015 Discharge date: 11/26/2015    Discharge Diagnoses:   Active Problems:   Atrial fibrillation (HCC)   CAP (community acquired pneumonia)   COPD (chronic obstructive pulmonary disease) (HCC)   Diabetes mellitus without complication (HCC)   Sepsis ([place])   Acute blood loss anemia   Melena   Hiatal hernia   Duodenal diverticulum   Dysphagia   History of colonic polyps   Diverticulosis of colon without hemorrhage     Medication List    TAKE these medications        allopurinol 300 MG tablet  Commonly known as:  ZYLOPRIM  Take 300 mg by mouth daily.     amLODipine 5 MG tablet  Commonly known as:  NORVASC  Take 1 tablet (5 mg total) by mouth daily.     aspirin EC 81 MG tablet  Take 81 mg by mouth daily.     cholecalciferol 1000 UNITS tablet  Commonly known as:  VITAMIN D  Take 4,000 Units by mouth daily.     clobetasol cream 0.05 %  Commonly known as:  TEMOVATE  Apply 1 application topically 2 (two) times daily as needed (Dry Skin).     diltiazem 240 MG 24 hr capsule  Commonly known as:  DILACOR XR  Take 240 mg by mouth daily.     docusate sodium 100 MG capsule  Commonly known as:  COLACE  Take 300 mg by mouth 2 (two) times daily.     ferrous fumarate 325 (106 FE) MG Tabs tablet  Commonly known as:  HEMOCYTE - 106 mg FE  Take 1 tablet by mouth 2 (two) times daily.     finasteride 5 MG tablet  Commonly known as:  PROSCAR  Take 5 mg by mouth daily.     folic acid 1 MG tablet  Commonly known as:  FOLVITE  Take 1 mg by mouth daily.     galantamine 8 MG tablet  Commonly known as:  RAZADYNE  Take 8 mg by mouth 2 (two) times daily.     glyBURIDE 5 MG tablet  Commonly known as:  DIABETA  Take 5-10 mg by mouth 2 (two) times daily with a meal. 2 in the morning and 1 tab at night     guaiFENesin 200 MG tablet  Take 400 mg by mouth 2 (two) times daily.     LANTUS SOLOSTAR 100 UNIT/ML Solostar Pen  Generic drug:  Insulin Glargine  Inject 30 Units into the skin daily at 10 pm.     levothyroxine 112 MCG tablet  Commonly known as:  SYNTHROID, LEVOTHROID  Take 124 mcg by mouth at bedtime.     lisinopril 20 MG tablet  Commonly known as:  PRINIVIL,ZESTRIL  Take 10 mg by mouth daily.     loratadine 10 MG tablet  Commonly known as:  CLARITIN  Take 10 mg by mouth daily.     magnesium oxide 400 MG tablet  Commonly known as:  MAG-OX  Take 400 mg by mouth 2 (two) times daily.     multivitamin with minerals Tabs tablet  Take 1 tablet by mouth daily.     omeprazole 20 MG capsule  Commonly known as:  PRILOSEC  Take 20 mg by mouth 2 (two) times daily.     ondansetron 4 MG tablet  Commonly known as:  ZOFRAN  Take 1  tablet by mouth every 4 (four) hours as needed for nausea or vomiting.     oxybutynin 5 MG tablet  Commonly known as:  DITROPAN  Take 5 mg by mouth 2 (two) times daily.     predniSONE 10 MG tablet  Commonly known as:  DELTASONE  Take 4 tablets (40 mg total) by mouth daily with breakfast. Take 4 daily 3 days, 3 daily 3 days, 2 daily 3 days 1 daily 3 days     rosuvastatin 20 MG tablet  Commonly known as:  CRESTOR  Take 1 tablet (20 mg total) by mouth daily.     sulfamethoxazole-trimethoprim 800-160 MG tablet  Commonly known as:  BACTRIM DS,SEPTRA DS  Take 1 tablet by mouth every 12 (twelve) hours.     vitamin C 500 MG tablet  Commonly known as:  ASCORBIC ACID  Take 500 mg by mouth 2 (two) times daily.     warfarin 5 MG tablet  Commonly known as:  COUMADIN  Take 1 tablet (5 mg total) by mouth at bedtime. Stop taking for 1 week        Discharged Condition: Improved    Consults: GI  Significant Diagnostic Studies: Dg Chest 2 View  11/20/2015  CLINICAL DATA:  Midsternal chest pain radiating into the left arm. History of diabetes and coronary  artery disease. EXAM: CHEST  2 VIEW COMPARISON:  CT 06/27/2014.  Radiographs 06/27/2014. FINDINGS: The heart size and mediastinal contours are stable with aortic atherosclerosis. There is chronic lung disease with central airway thickening and peribronchovascular nodularity. There are increased patchy opacities at both lung bases compared with the most recent studies, but no consolidation or significant pleural effusion. The bones appear unchanged. IMPRESSION: Mildly progressive patchy airspace opacities at both lung bases compared with recent prior studies. These findings are nonspecific and may reflect atelectasis or chronic inflammation. Other sequela of chronic granulomatous disease appear unchanged. Electronically Signed   By: Richardean Sale M.D.   On: 11/20/2015 14:37    Lab Results: Basic Metabolic Panel: No results for input(s): NA, K, CL, CO2, GLUCOSE, BUN, CREATININE, CALCIUM, MG, PHOS in the last 72 hours. Liver Function Tests: No results for input(s): AST, ALT, ALKPHOS, BILITOT, PROT, ALBUMIN in the last 72 hours.   CBC:  Recent Labs  11/25/15 0611 11/26/15 0620  WBC 5.3 5.2  NEUTROABS 4.1 3.1  HGB 9.4* 9.6*  HCT 29.6* 30.4*  MCV 90.8 90.2  PLT 213 233    Recent Results (from the past 240 hour(s))  Blood culture (routine x 2)     Status: None   Collection Time: 11/20/15  1:26 PM  Result Value Ref Range Status   Specimen Description BLOOD LEFT ANTECUBITAL  Final   Special Requests BOTTLES DRAWN AEROBIC AND ANAEROBIC 4CC  Final   Culture NO GROWTH 6 DAYS  Final   Report Status 11/26/2015 FINAL  Final  Blood culture (routine x 2)     Status: None   Collection Time: 11/20/15  1:40 PM  Result Value Ref Range Status   Specimen Description BLOOD LEFT HAND  Final   Special Requests BOTTLES DRAWN AEROBIC AND ANAEROBIC 4CC  Final   Culture NO GROWTH 6 DAYS  Final   Report Status 11/26/2015 FINAL  Final  MRSA PCR Screening     Status: Abnormal   Collection Time: 11/21/15   4:30 PM  Result Value Ref Range Status   MRSA by PCR POSITIVE (A) NEGATIVE Final    Comment:  The GeneXpert MRSA Assay (FDA approved for NASAL specimens only), is one component of a comprehensive MRSA colonization surveillance program. It is not intended to diagnose MRSA infection nor to guide or monitor treatment for MRSA infections. RESULT CALLED TO, READ BACK BY AND VERIFIED WITH: Arvil Persons AT 2036 [id number] BY FORSYTH K   Culture, sputum-assessment     Status: None   Collection Time: 11/21/15  4:56 PM  Result Value Ref Range Status   Specimen Description SPUTUM EXPECTORATED  Final   Special Requests NONE  Final   Sputum evaluation   Final    THIS SPECIMEN IS ACCEPTABLE. RESPIRATORY CULTURE REPORT TO FOLLOW. Performed at [facility]    Report Status 11/21/2015 FINAL  Final  Culture, respiratory (NON-Expectorated)     Status: None (Preliminary result)   Collection Time: 11/21/15  4:56 PM  Result Value Ref Range Status   Specimen Description SPUTUM EXPECTORATED  Final   Special Requests NONE  Final   Gram Stain   Final    ABUNDANT WBC PRESENT, PREDOMINANTLY PMN FEW SQUAMOUS EPITHELIAL CELLS PRESENT FEW GRAM POSITIVE COCCI IN PAIRS RARE GRAM POSITIVE RODS Performed at [facility]    Culture   Final    FEW PSEUDOMONAS AERUGINOSA FEW STAPHYLOCOCCUS AUREUS Note: RIFAMPIN AND GENTAMICIN SHOULD NOT BE USED AS SINGLE DRUGS FOR TREATMENT OF STAPH INFECTIONS. Performed at [facility] Course: This is a 80 year old who came to the emergency department because of increasing problems with shortness of breath and cough. He was treated in the emergency department but not able to go home and was found to have pneumonia. He appeared to be septic initially and responded to fluid resuscitation. He was improving from his pneumonia when he complained of abdominal discomfort and dark stool and was found to be  anemic with acute blood loss anemia. He was transfused and GI consultation was obtained. He required a total of 4 units of packed red blood cells. He had multiple GI studies including upper endoscopy, colonoscopy and Givens capsule but no cause of bleeding was identified. He has chronic atrial fibrillation on chronic anticoagulation and his Coumadin was held. His pneumonia cleared rapidly.  Discharge Exam: Blood pressure 134/84, pulse 69, temperature 97.8 F (36.6 C), temperature source Oral, resp. rate 20, height 5' 9.5" (1.765 m), weight 85.276 kg (188 lb), SpO2 98 %. He is awake and alert. He looks comfortable. His chest is clear. His heart is irregular  Disposition: He will hold Coumadin for one week to allow whatever the source of bleeding is to potentially heal. He follows at the [place] as far as managing his Coumadin and he will let them know. He will be discharged home and does not want home health services      Discharge Instructions    Discharge patient    Complete by:  As directed              Signed: Agam Tuohy L   11/26/2015, 10:25 AM

## 2015-11-26 NOTE — Progress Notes (Signed)
Patient stable and discharged with all belongings via wheelchair. IV removed and site intact.

## 2015-11-26 NOTE — Progress Notes (Signed)
Subjective: He says he feels well and wants to go home  Objective: Vital signs in last 24 hours: Temp:  [97.5 F (36.4 C)-97.8 F (36.6 C)] 97.8 F (36.6 C) (12/10 0459) Pulse Rate:  [69-81] 69 (12/10 0459) Resp:  [20] 20 (12/10 0459) BP: (126-141)/(55-84) 134/84 mmHg (12/10 0459) SpO2:  [93 %-98 %] 98 % (12/10 0459) Weight change:  Last BM Date: 11/24/15  Intake/Output from previous day:    PHYSICAL EXAM General appearance: alert, cooperative and no distress Resp: clear to auscultation bilaterally Cardio: irregularly irregular rhythm GI: soft, non-tender; bowel sounds normal; no masses,  no organomegaly Extremities: extremities normal, atraumatic, no cyanosis or edema  Lab Results:  Results for orders placed or performed during the hospital encounter of 11/20/15 (from the past 48 hour(s))  Glucose, capillary     Status: None   Collection Time: 11/24/15 11:34 AM  Result Value Ref Range   Glucose-Capillary 82 65 - 99 mg/dL   Comment 1 Notify RN    Comment 2 Document in Chart   Glucose, capillary     Status: None   Collection Time: 11/24/15 12:44 PM  Result Value Ref Range   Glucose-Capillary 95 65 - 99 mg/dL  Glucose, capillary     Status: Abnormal   Collection Time: 11/24/15  4:30 PM  Result Value Ref Range   Glucose-Capillary 127 (H) 65 - 99 mg/dL   Comment 1 Notify RN    Comment 2 Document in Chart   Glucose, capillary     Status: None   Collection Time: 11/24/15  9:38 PM  Result Value Ref Range   Glucose-Capillary 98 65 - 99 mg/dL   Comment 1 Notify RN    Comment 2 Document in Chart   Protime-INR     Status: None   Collection Time: 11/25/15  6:11 AM  Result Value Ref Range   Prothrombin Time 15.2 11.6 - 15.2 seconds   INR 1.18 0.00 - 1.49  CBC with Differential/Platelet     Status: Abnormal   Collection Time: 11/25/15  6:11 AM  Result Value Ref Range   WBC 5.3 4.0 - 10.5 K/uL   RBC 3.26 (L) 4.22 - 5.81 MIL/uL   Hemoglobin 9.4 (L) 13.0 - 17.0 g/dL   HCT  29.6 (L) 39.0 - 52.0 %   MCV 90.8 78.0 - 100.0 fL   MCH 28.8 26.0 - 34.0 pg   MCHC 31.8 30.0 - 36.0 g/dL   RDW 16.6 (H) 11.5 - 15.5 %   Platelets 213 150 - 400 K/uL   Neutrophils Relative % 77 %   Neutro Abs 4.1 1.7 - 7.7 K/uL   Lymphocytes Relative 16 %   Lymphs Abs 0.8 0.7 - 4.0 K/uL   Monocytes Relative 7 %   Monocytes Absolute 0.4 0.1 - 1.0 K/uL   Eosinophils Relative 0 %   Eosinophils Absolute 0.0 0.0 - 0.7 K/uL   Basophils Relative 0 %   Basophils Absolute 0.0 0.0 - 0.1 K/uL  Glucose, capillary     Status: Abnormal   Collection Time: 11/25/15  7:59 AM  Result Value Ref Range   Glucose-Capillary 107 (H) 65 - 99 mg/dL   Comment 1 Notify RN    Comment 2 Document in Chart   Glucose, capillary     Status: Abnormal   Collection Time: 11/25/15 11:41 AM  Result Value Ref Range   Glucose-Capillary 124 (H) 65 - 99 mg/dL   Comment 1 Notify RN    Comment 2 Document  in Chart   Glucose, capillary     Status: Abnormal   Collection Time: 11/25/15  4:15 PM  Result Value Ref Range   Glucose-Capillary 219 (H) 65 - 99 mg/dL  Glucose, capillary     Status: Abnormal   Collection Time: 11/25/15  8:54 PM  Result Value Ref Range   Glucose-Capillary 227 (H) 65 - 99 mg/dL   Comment 1 Notify RN    Comment 2 Document in Chart   Protime-INR     Status: None   Collection Time: 11/26/15  6:20 AM  Result Value Ref Range   Prothrombin Time 14.4 11.6 - 15.2 seconds   INR 1.10 0.00 - 1.49  CBC with Differential/Platelet     Status: Abnormal   Collection Time: 11/26/15  6:20 AM  Result Value Ref Range   WBC 5.2 4.0 - 10.5 K/uL   RBC 3.37 (L) 4.22 - 5.81 MIL/uL   Hemoglobin 9.6 (L) 13.0 - 17.0 g/dL   HCT 30.4 (L) 39.0 - 52.0 %   MCV 90.2 78.0 - 100.0 fL   MCH 28.5 26.0 - 34.0 pg   MCHC 31.6 30.0 - 36.0 g/dL   RDW 15.9 (H) 11.5 - 15.5 %   Platelets 233 150 - 400 K/uL   Neutrophils Relative % 61 %   Lymphocytes Relative 31 %   Monocytes Relative 7 %   Eosinophils Relative 0 %   Basophils  Relative 1 %   Band Neutrophils 0 %   Metamyelocytes Relative 0 %   Myelocytes 0 %   Promyelocytes Absolute 0 %   Blasts 0 %   nRBC 0 0 /100 WBC   Other 0 %   Neutro Abs 3.1 1.7 - 7.7 K/uL   Lymphs Abs 1.6 0.7 - 4.0 K/uL   Monocytes Absolute 0.4 0.1 - 1.0 K/uL   Eosinophils Absolute 0.0 0.0 - 0.7 K/uL   Basophils Absolute 0.1 0.0 - 0.1 K/uL  Glucose, capillary     Status: Abnormal   Collection Time: 11/26/15  7:49 AM  Result Value Ref Range   Glucose-Capillary 58 (L) 65 - 99 mg/dL  Glucose, capillary     Status: Abnormal   Collection Time: 11/26/15  8:33 AM  Result Value Ref Range   Glucose-Capillary 115 (H) 65 - 99 mg/dL    ABGS No results for input(s): PHART, PO2ART, TCO2, HCO3 in the last 72 hours.  Invalid input(s): PCO2 CULTURES Recent Results (from the past 240 hour(s))  Blood culture (routine x 2)     Status: None   Collection Time: 11/20/15  1:26 PM  Result Value Ref Range Status   Specimen Description BLOOD LEFT ANTECUBITAL  Final   Special Requests BOTTLES DRAWN AEROBIC AND ANAEROBIC 4CC  Final   Culture NO GROWTH 6 DAYS  Final   Report Status 11/26/2015 FINAL  Final  Blood culture (routine x 2)     Status: None   Collection Time: 11/20/15  1:40 PM  Result Value Ref Range Status   Specimen Description BLOOD LEFT HAND  Final   Special Requests BOTTLES DRAWN AEROBIC AND ANAEROBIC 4CC  Final   Culture NO GROWTH 6 DAYS  Final   Report Status 11/26/2015 FINAL  Final  MRSA PCR Screening     Status: Abnormal   Collection Time: 11/21/15  4:30 PM  Result Value Ref Range Status   MRSA by PCR POSITIVE (A) NEGATIVE Final    Comment:        The GeneXpert MRSA Assay (  FDA approved for NASAL specimens only), is one component of a comprehensive MRSA colonization surveillance program. It is not intended to diagnose MRSA infection nor to guide or monitor treatment for MRSA infections. RESULT CALLED TO, READ BACK BY AND VERIFIED WITH: Arvil Persons AT 2036 [id number] BY  FORSYTH K   Culture, sputum-assessment     Status: None   Collection Time: 11/21/15  4:56 PM  Result Value Ref Range Status   Specimen Description SPUTUM EXPECTORATED  Final   Special Requests NONE  Final   Sputum evaluation   Final    THIS SPECIMEN IS ACCEPTABLE. RESPIRATORY CULTURE REPORT TO FOLLOW. Performed at [facility]    Report Status 11/21/2015 FINAL  Final  Culture, respiratory (NON-Expectorated)     Status: None (Preliminary result)   Collection Time: 11/21/15  4:56 PM  Result Value Ref Range Status   Specimen Description SPUTUM EXPECTORATED  Final   Special Requests NONE  Final   Gram Stain   Final    ABUNDANT WBC PRESENT, PREDOMINANTLY PMN FEW SQUAMOUS EPITHELIAL CELLS PRESENT FEW GRAM POSITIVE COCCI IN PAIRS RARE GRAM POSITIVE RODS Performed at [facility]    Culture   Final    FEW PSEUDOMONAS AERUGINOSA FEW STAPHYLOCOCCUS AUREUS Note: RIFAMPIN AND GENTAMICIN SHOULD NOT BE USED AS SINGLE DRUGS FOR TREATMENT OF STAPH INFECTIONS. Performed at [facility]    Report Status PENDING  Incomplete   Studies/Results: No results found.  Medications:  Prior to Admission:  Prescriptions prior to admission  Medication Sig Dispense Refill Last Dose  . allopurinol (ZYLOPRIM) 300 MG tablet Take 300 mg by mouth daily.   11/20/2015 at Unknown time  . amLODipine (NORVASC) 5 MG tablet Take 1 tablet (5 mg total) by mouth daily. 180 tablet 3 11/20/2015 at Unknown time  . Ascorbic Acid (VITAMIN C) 500 MG tablet Take 500 mg by mouth 2 (two) times daily.    11/20/2015 at Unknown time  . aspirin EC 81 MG tablet Take 81 mg by mouth daily.   11/20/2015 at Unknown time  . cholecalciferol (VITAMIN D) 1000 UNITS tablet Take 4,000 Units by mouth daily.   11/20/2015 at Unknown time  . clobetasol cream (TEMOVATE) 5.46 % Apply 1 application topically 2 (two) times daily as needed (Dry Skin).    Past Week at Unknown time  . diltiazem (DILACOR XR) 240 MG 24 hr capsule Take  240 mg by mouth daily.     11/20/2015 at Unknown time  . docusate sodium (COLACE) 100 MG capsule Take 300 mg by mouth 2 (two) times daily.   11/20/2015 at Unknown time  . ferrous fumarate (HEMOCYTE - 106 MG FE) 325 (106 FE) MG TABS tablet Take 1 tablet by mouth 2 (two) times daily.   11/20/2015 at Unknown time  . finasteride (PROSCAR) 5 MG tablet Take 5 mg by mouth daily.     11/20/2015 at Unknown time  . folic acid (FOLVITE) 1 MG tablet Take 1 mg by mouth daily.   11/20/2015 at Unknown time  . galantamine (RAZADYNE) 8 MG tablet Take 8 mg by mouth 2 (two) times daily.     11/20/2015 at Unknown time  . glyBURIDE (DIABETA) 5 MG tablet Take 5-10 mg by mouth 2 (two) times daily with a meal. 2 in the morning and 1 tab at night   11/20/2015 at Unknown time  . guaiFENesin 200 MG tablet Take 400 mg by mouth 2 (two) times daily.   11/20/2015 at Unknown time  .  Insulin Glargine (LANTUS SOLOSTAR) 100 UNIT/ML Solostar Pen Inject 30 Units into the skin daily at 10 pm.   11/19/2015 at Unknown time  . levothyroxine (SYNTHROID, LEVOTHROID) 112 MCG tablet Take 124 mcg by mouth at bedtime.   11/19/2015 at Unknown time  . lisinopril (PRINIVIL,ZESTRIL) 20 MG tablet Take 10 mg by mouth daily.   11/20/2015 at Unknown time  . loratadine (CLARITIN) 10 MG tablet Take 10 mg by mouth daily.     11/20/2015 at Unknown time  . magnesium oxide (MAG-OX) 400 MG tablet Take 400 mg by mouth 2 (two) times daily.    11/20/2015 at Unknown time  . Multiple Vitamin (MULTIVITAMIN WITH MINERALS) TABS tablet Take 1 tablet by mouth daily.   11/20/2015 at Unknown time  . omeprazole (PRILOSEC) 20 MG capsule Take 20 mg by mouth 2 (two) times daily.     11/20/2015 at Unknown time  . ondansetron (ZOFRAN) 4 MG tablet Take 1 tablet by mouth every 4 (four) hours as needed for nausea or vomiting.    11/20/2015 at Unknown time  . oxybutynin (DITROPAN) 5 MG tablet Take 5 mg by mouth 2 (two) times daily.    11/20/2015 at Unknown time  . rosuvastatin (CRESTOR) 20 MG  tablet Take 1 tablet (20 mg total) by mouth daily. 90 tablet 3 11/20/2015 at Unknown time  . warfarin (COUMADIN) 5 MG tablet Take 5 mg by mouth at bedtime.    11/19/2015 at 1800  . [DISCONTINUED] metroNIDAZOLE (FLAGYL) 500 MG tablet Take 1 tablet by mouth 3 (three) times daily. Starting 11/07/2015 x 7 days.   Completed Course at Unknown time   Scheduled: . sodium chloride   Intravenous Once  . allopurinol  300 mg Oral Daily  . aspirin EC  81 mg Oral Daily  . azithromycin (ZITHROMAX) 500 MG IVPB  500 mg Intravenous Once  . Chlorhexidine Gluconate Cloth  6 each Topical Q0600  . clobetasol ointment   Topical BID  . docusate sodium  300 mg Oral BID  . finasteride  5 mg Oral Daily  . folic acid  1 mg Oral Daily  . galantamine  8 mg Oral BID  . guaiFENesin  600 mg Oral BID  . Influenza vac split quadrivalent PF  0.5 mL Intramuscular Tomorrow-1000  . insulin aspart  0-15 Units Subcutaneous TID WC  . insulin aspart  0-5 Units Subcutaneous QHS  . insulin glargine  30 Units Subcutaneous Q2200  . ipratropium-albuterol  3 mL Nebulization BID  . levothyroxine  125 mcg Oral QAC breakfast  . loratadine  10 mg Oral Daily  . magnesium oxide  400 mg Oral BID  . oxybutynin  5 mg Oral BID  . pantoprazole  40 mg Oral BID AC  . polyethylene glycol  2,000 mL Oral Once  . predniSONE  40 mg Oral Q breakfast  . rosuvastatin  20 mg Oral Daily  . sulfamethoxazole-trimethoprim  1 tablet Oral Q12H  . vitamin C  500 mg Oral BID   Continuous: . sodium chloride 100 mL/hr at 11/26/15 0324   [facility]:ENIDPOEUMPNTI, traMADol  Assesment: He was admitted with community-acquired pneumonia with sepsis. He also had COPD exacerbation. This has cleared.  He has chronic atrial fibrillation and has been chronically anticoagulated.  He developed GI bleeding and it's not clear where that was from.  He has diabetes at baseline Active Problems:   Atrial fibrillation (HCC)   CAP (community acquired pneumonia)   COPD (chronic  obstructive pulmonary disease) ([place])   Diabetes  mellitus without complication (HCC)   Sepsis ([place])   Acute blood loss anemia   Melena   Hiatal hernia   Duodenal diverticulum   Dysphagia   History of colonic polyps   Diverticulosis of colon without hemorrhage    Plan: He is ready for discharge. He will need to stay off Coumadin for a week. He will follow-up at the [place] where he gets his primary care    LOS: 6 days   [place] Basista L 11/26/2015, 10:21 AM

## 2015-11-26 NOTE — Progress Notes (Signed)
Patient's CBG 58 at 0749, patient ate breakfast and Blood sugar retaken and was 115 at 0833.

## 2015-11-28 ENCOUNTER — Encounter: Payer: Self-pay | Admitting: Internal Medicine

## 2015-11-28 ENCOUNTER — Ambulatory Visit (HOSPITAL_COMMUNITY)
Admission: RE | Admit: 2015-11-28 | Discharge: 2015-11-28 | Disposition: A | Discharge status: Home / Self Care | Payer: Medicare Other | Source: Ambulatory Visit | Attending: Nurse Practitioner | Admitting: Nurse Practitioner

## 2015-11-28 ENCOUNTER — Other Ambulatory Visit: Payer: Self-pay

## 2015-11-28 DIAGNOSIS — D649 Anemia, unspecified: Secondary | ICD-10-CM | POA: Insufficient documentation

## 2015-11-28 NOTE — Telephone Encounter (Signed)
KUB ordered.  Called pt and he states he doesn't want to go out in the rain today.  I advised pt to go for xray today or tomorrow to make sure the capsule was passed.   Pt states he would.

## 2016-03-05 ENCOUNTER — Observation Stay (HOSPITAL_COMMUNITY)
Admission: EM | Admit: 2016-03-05 | Discharge: 2016-03-07 | Disposition: A | Discharge status: Home / Self Care | Payer: Medicare Other | Attending: Pulmonary Disease | Admitting: Pulmonary Disease

## 2016-03-05 ENCOUNTER — Encounter (HOSPITAL_COMMUNITY): Payer: Self-pay | Admitting: Emergency Medicine

## 2016-03-05 DIAGNOSIS — I2510 Atherosclerotic heart disease of native coronary artery without angina pectoris: Secondary | ICD-10-CM | POA: Diagnosis present

## 2016-03-05 DIAGNOSIS — Z794 Long term (current) use of insulin: Secondary | ICD-10-CM | POA: Diagnosis not present

## 2016-03-05 DIAGNOSIS — Z87891 Personal history of nicotine dependence: Secondary | ICD-10-CM | POA: Diagnosis not present

## 2016-03-05 DIAGNOSIS — Z7984 Long term (current) use of oral hypoglycemic drugs: Secondary | ICD-10-CM | POA: Diagnosis not present

## 2016-03-05 DIAGNOSIS — Z9049 Acquired absence of other specified parts of digestive tract: Secondary | ICD-10-CM | POA: Diagnosis not present

## 2016-03-05 DIAGNOSIS — Z79899 Other long term (current) drug therapy: Secondary | ICD-10-CM | POA: Diagnosis not present

## 2016-03-05 DIAGNOSIS — E785 Hyperlipidemia, unspecified: Secondary | ICD-10-CM | POA: Insufficient documentation

## 2016-03-05 DIAGNOSIS — Z7901 Long term (current) use of anticoagulants: Secondary | ICD-10-CM

## 2016-03-05 DIAGNOSIS — K625 Hemorrhage of anus and rectum: Secondary | ICD-10-CM | POA: Diagnosis present

## 2016-03-05 DIAGNOSIS — Z7982 Long term (current) use of aspirin: Secondary | ICD-10-CM | POA: Insufficient documentation

## 2016-03-05 DIAGNOSIS — I10 Essential (primary) hypertension: Secondary | ICD-10-CM | POA: Diagnosis present

## 2016-03-05 DIAGNOSIS — E119 Type 2 diabetes mellitus without complications: Secondary | ICD-10-CM | POA: Diagnosis not present

## 2016-03-05 DIAGNOSIS — R062 Wheezing: Secondary | ICD-10-CM | POA: Insufficient documentation

## 2016-03-05 DIAGNOSIS — J449 Chronic obstructive pulmonary disease, unspecified: Secondary | ICD-10-CM | POA: Diagnosis present

## 2016-03-05 DIAGNOSIS — K922 Gastrointestinal hemorrhage, unspecified: Principal | ICD-10-CM | POA: Diagnosis present

## 2016-03-05 DIAGNOSIS — I4891 Unspecified atrial fibrillation: Secondary | ICD-10-CM | POA: Diagnosis present

## 2016-03-05 HISTORY — DX: Anemia, unspecified: D64.9

## 2016-03-05 HISTORY — DX: Thyrotoxicosis, unspecified without thyrotoxic crisis or storm: E05.90

## 2016-03-05 HISTORY — DX: Diverticulosis of intestine, part unspecified, without perforation or abscess without bleeding: K57.90

## 2016-03-05 HISTORY — DX: Gastrointestinal hemorrhage, unspecified: K92.2

## 2016-03-05 LAB — CBC
HCT: 39.9 % (ref 39.0–52.0)
HCT: 40 % (ref 39.0–52.0)
HEMOGLOBIN: 13.1 g/dL (ref 13.0–17.0)
Hemoglobin: 12.9 g/dL — ABNORMAL LOW (ref 13.0–17.0)
MCH: 29.7 pg (ref 26.0–34.0)
MCH: 30 pg (ref 26.0–34.0)
MCHC: 32.3 g/dL (ref 30.0–36.0)
MCHC: 32.8 g/dL (ref 30.0–36.0)
MCV: 91.9 fL (ref 78.0–100.0)
MCV: 91.7 fL (ref 78.0–100.0)
PLATELETS: 199 10*3/uL (ref 150–400)
Platelets: 214 10*3/uL (ref 150–400)
RBC: 4.34 MIL/uL (ref 4.22–5.81)
RBC: 4.36 MIL/uL (ref 4.22–5.81)
RDW: 14.4 % (ref 11.5–15.5)
RDW: 14.5 % (ref 11.5–15.5)
WBC: 7.1 10*3/uL (ref 4.0–10.5)
WBC: 6.7 10*3/uL (ref 4.0–10.5)

## 2016-03-05 LAB — CBC WITH DIFFERENTIAL/PLATELET
BASOS PCT: 1 %
Basophils Absolute: 0 10*3/uL (ref 0.0–0.1)
Eosinophils Absolute: 0.2 10*3/uL (ref 0.0–0.7)
Eosinophils Relative: 3 %
HCT: 44.8 % (ref 39.0–52.0)
HEMOGLOBIN: 14.3 g/dL (ref 13.0–17.0)
LYMPHS ABS: 2 10*3/uL (ref 0.7–4.0)
Lymphocytes Relative: 26 %
MCH: 29.4 pg (ref 26.0–34.0)
MCHC: 31.9 g/dL (ref 30.0–36.0)
MCV: 92 fL (ref 78.0–100.0)
MONO ABS: 0.6 10*3/uL (ref 0.1–1.0)
MONOS PCT: 8 %
Neutro Abs: 4.8 10*3/uL (ref 1.7–7.7)
Neutrophils Relative %: 62 %
Platelets: 234 10*3/uL (ref 150–400)
RBC: 4.87 MIL/uL (ref 4.22–5.81)
RDW: 14.4 % (ref 11.5–15.5)
WBC: 7.6 10*3/uL (ref 4.0–10.5)

## 2016-03-05 LAB — COMPREHENSIVE METABOLIC PANEL
ALBUMIN: 4 g/dL (ref 3.5–5.0)
ALK PHOS: 74 U/L (ref 38–126)
ALT: 18 U/L (ref 17–63)
ANION GAP: 11 (ref 5–15)
AST: 24 U/L (ref 15–41)
BUN: 23 mg/dL — AB (ref 6–20)
CALCIUM: 8.8 mg/dL — AB (ref 8.9–10.3)
CO2: 23 mmol/L (ref 22–32)
Chloride: 107 mmol/L (ref 101–111)
Creatinine, Ser: 0.73 mg/dL (ref 0.61–1.24)
GFR calc Af Amer: >60 mL/min (ref >60)
GFR calc non Af Amer: >60 mL/min (ref >60)
GLUCOSE: 148 mg/dL — AB (ref 65–99)
Potassium: 4.5 mmol/L (ref 3.5–5.1)
Sodium: 141 mmol/L (ref 135–145)
Total Bilirubin: 0.7 mg/dL (ref 0.3–1.2)
Total Protein: 6.9 g/dL (ref 6.5–8.1)

## 2016-03-05 LAB — GLUCOSE, CAPILLARY
GLUCOSE-CAPILLARY: 133 mg/dL — AB (ref 65–99)
GLUCOSE-CAPILLARY: 188 mg/dL — AB (ref 65–99)

## 2016-03-05 LAB — MRSA PCR SCREENING: MRSA by PCR: NEGATIVE

## 2016-03-05 LAB — TYPE AND SCREEN
ABO/RH(D): O POS
Antibody Screen: NEGATIVE

## 2016-03-05 LAB — POC OCCULT BLOOD, ED: FECAL OCCULT BLD: POSITIVE — AB

## 2016-03-05 LAB — PROTIME-INR
INR: 2.65 — ABNORMAL HIGH (ref 0.00–1.49)
Prothrombin Time: 27.9 seconds — ABNORMAL HIGH (ref 11.6–15.2)

## 2016-03-05 MED ORDER — ONDANSETRON HCL 4 MG/2ML IJ SOLN: 4.0000 mg | Freq: Three times a day (TID) | INTRAMUSCULAR | Status: DC | PRN

## 2016-03-05 MED ORDER — ROSUVASTATIN CALCIUM 20 MG PO TABS
20.0000 mg | ORAL_TABLET | Freq: Every day | ORAL | Status: DC
  Administered 2016-03-05 – 2016-03-06 (×2): 20 mg via ORAL
  Filled 2016-03-05 (×2): qty 1

## 2016-03-05 MED ORDER — SODIUM CHLORIDE 0.9 % IV SOLN: 250.0000 mL | INTRAVENOUS | Status: DC | PRN

## 2016-03-05 MED ORDER — ONDANSETRON HCL 4 MG/2ML IJ SOLN: 4.0000 mg | Freq: Four times a day (QID) | INTRAMUSCULAR | Status: DC | PRN

## 2016-03-05 MED ORDER — ACETAMINOPHEN 325 MG PO TABS: 650.0000 mg | ORAL_TABLET | Freq: Four times a day (QID) | ORAL | Status: DC | PRN

## 2016-03-05 MED ORDER — PANTOPRAZOLE SODIUM 40 MG PO TBEC: 40.0000 mg | DELAYED_RELEASE_TABLET | Freq: Every day | ORAL | Status: DC

## 2016-03-05 MED ORDER — DILTIAZEM HCL ER COATED BEADS 240 MG PO CP24
240.0000 mg | ORAL_CAPSULE | Freq: Every day | ORAL | Status: DC
  Administered 2016-03-06 – 2016-03-07 (×2): 240 mg via ORAL
  Filled 2016-03-05 (×2): qty 1

## 2016-03-05 MED ORDER — AMLODIPINE BESYLATE 5 MG PO TABS
5.0000 mg | ORAL_TABLET | Freq: Every day | ORAL | Status: DC
  Administered 2016-03-06 – 2016-03-07 (×2): 5 mg via ORAL
  Filled 2016-03-05 (×2): qty 1

## 2016-03-05 MED ORDER — SODIUM CHLORIDE 0.9% FLUSH
3.0000 mL | Freq: Two times a day (BID) | INTRAVENOUS | Status: DC
  Administered 2016-03-05 – 2016-03-07 (×5): 3 mL via INTRAVENOUS

## 2016-03-05 MED ORDER — INSULIN ASPART 100 UNIT/ML ~~LOC~~ SOLN
0.0000 [IU] | Freq: Four times a day (QID) | SUBCUTANEOUS | Status: DC
  Administered 2016-03-05: 1 [IU] via SUBCUTANEOUS
  Administered 2016-03-05 – 2016-03-06 (×2): 2 [IU] via SUBCUTANEOUS
  Administered 2016-03-06 (×2): 3 [IU] via SUBCUTANEOUS
  Administered 2016-03-07: 2 [IU] via SUBCUTANEOUS

## 2016-03-05 MED ORDER — GALANTAMINE HYDROBROMIDE 4 MG PO TABS
8.0000 mg | ORAL_TABLET | Freq: Two times a day (BID) | ORAL | Status: DC
  Administered 2016-03-05 – 2016-03-07 (×5): 8 mg via ORAL
  Filled 2016-03-05 (×9): qty 2

## 2016-03-05 MED ORDER — ALLOPURINOL 300 MG PO TABS
300.0000 mg | ORAL_TABLET | Freq: Every day | ORAL | Status: DC
  Administered 2016-03-06 – 2016-03-07 (×2): 300 mg via ORAL
  Filled 2016-03-05 (×2): qty 1

## 2016-03-05 MED ORDER — INSULIN GLARGINE 100 UNIT/ML ~~LOC~~ SOLN
24.0000 [IU] | Freq: Every day | SUBCUTANEOUS | Status: DC
  Administered 2016-03-05 – 2016-03-06 (×2): 24 [IU] via SUBCUTANEOUS
  Filled 2016-03-05 (×3): qty 0.24

## 2016-03-05 MED ORDER — SODIUM CHLORIDE 0.9% FLUSH: 3.0000 mL | INTRAVENOUS | Status: DC | PRN

## 2016-03-05 MED ORDER — LEVOTHYROXINE SODIUM 50 MCG PO TABS
250.0000 ug | ORAL_TABLET | Freq: Every day | ORAL | Status: DC
  Administered 2016-03-06 – 2016-03-07 (×2): 250 ug via ORAL
  Filled 2016-03-05 (×2): qty 1

## 2016-03-05 MED ORDER — ONDANSETRON HCL 4 MG PO TABS: 4.0000 mg | ORAL_TABLET | Freq: Four times a day (QID) | ORAL | Status: DC | PRN

## 2016-03-05 MED ORDER — ACETAMINOPHEN 650 MG RE SUPP: 650.0000 mg | Freq: Four times a day (QID) | RECTAL | Status: DC | PRN

## 2016-03-05 MED ORDER — LISINOPRIL 10 MG PO TABS
20.0000 mg | ORAL_TABLET | Freq: Every day | ORAL | Status: DC
  Administered 2016-03-06 – 2016-03-07 (×2): 20 mg via ORAL
  Filled 2016-03-05 (×2): qty 2

## 2016-03-05 NOTE — ED Notes (Signed)
Pt reports dark red/black stools that began this morning. Pt also reports lower abdominal pain. States hx of GI bleed. Pt also takes iron supplements. Pt on Coumadin. Denies n/v/d.

## 2016-03-05 NOTE — H&P (Signed)
_ History and Physical  Caleb [name] XKG:[id number] DOB: 1935/01/14 DOA: 03/05/2016  Referring physician: Noemi Chapel, MD PCP: Alonza Bogus, MD   Chief Complaint: Rectal bleeding   HPI:   60 yom with PMH of HLD, HTN, Afib, and DM presented with dark blood per rectum. Admitted December 2016 for GI bleed at time he underwent colonoscopy, capsule endoscopy with no definite source of bleeding. Diffuse diverticulosis noted.  This morning he noticed blood in the stool after a bowel movement. He reports he has had 4 movements since. He takes daily iron and thought the black tool he noted originally was due to iron intake. Lower abdominal fullness. No pain. Denies fever, NSAID use, rash, muscle aches, dysuria, nausea vomiting, abdominal pain .  In the emergency department Afebrile, vital signs stable. Pertinent labs: Complete metabolic panel unremarkable. Blood sugar 148. Hemoglobin 14.3. INR 2.65. Fecal occult blood positive. EKG: Independently reviewed.  Imaging: independently reviewed.  Review of Systems:  Positive for: rectal bleeding.  Negative for fever, visual changes, sore throat, rash, new muscle aches, chest pain, SOB, dysuria, n/v/abdominal pain.  Past Medical History  Diagnosis Date  . Acute exacerbation of chronic bronchitis ([place])   . History of tuberculosis   . CAD (coronary artery disease)   . Hyperlipidemia   . BPH (benign prostatic hypertrophy)   . Thyroid disease     hyperthyroidism  . Diabetes mellitus without complication ([place])   . GERD (gastroesophageal reflux disease)   . Diverticulitis   . GI bleed   . Anemia     Past Surgical History  Procedure Laterality Date  . Cholecystectomy    . Thyroidectomy    . Ercp  09/2005    Dr. Laural Golden: biliary leak, ERCP with stent placement  . Ercp  11/2005    Dr. Laural Golden: ERCP with removal of biliary stent and sphincterotomy   . Esophagogastroduodenoscopy N/A 11/23/2015    Procedure: ESOPHAGOGASTRODUODENOSCOPY (EGD);   Surgeon: Daneil Dolin, MD;  [facility];  Service: Endoscopy;  Laterality: N/A;  with possible dilation   . Colonoscopy N/A 11/24/2015    Procedure: COLONOSCOPY;  Surgeon: Daneil Dolin, MD;  [facility];  Service: Endoscopy;  Laterality: N/A;  . Givens capsule study N/A 11/24/2015    Procedure: GIVENS CAPSULE STUDY;  Surgeon: Daneil Dolin, MD;  [facility];  Service: Endoscopy;  Laterality: N/A;    Social History:  reports that he quit smoking about 17 years ago. His smoking use included Cigarettes. He started smoking about 71 years ago. He has a 37.5 pack-year smoking history. He has never used smokeless tobacco. He reports that he does not drink alcohol or use illicit drugs. lives with their spouse Self-care  Allergies  Allergen Reactions  . Levaquin [Levofloxacin In D5w]     SWELLING    Family History  Problem Relation Age of Onset  . Heart attack Father   . Heart failure Mother   . Colon cancer Neg Hx   . Stomach cancer Sister     deceased in her 60s from stomach cancer     Prior to Admission medications   Medication Sig Start Date End Date Taking? Authorizing Provider  allopurinol (ZYLOPRIM) 300 MG tablet Take 300 mg by mouth daily.    Historical Provider, MD  amLODipine (NORVASC) 5 MG tablet Take 1 tablet (5 mg total) by mouth daily. 09/16/14   Arnoldo Lenis, MD  Ascorbic Acid (VITAMIN C) 500 MG tablet Take 500 mg  by mouth 2 (two) times daily.     Historical Provider, MD  aspirin EC 81 MG tablet Take 81 mg by mouth daily.    Historical Provider, MD  cholecalciferol (VITAMIN D) 1000 UNITS tablet Take 4,000 Units by mouth daily.    Historical Provider, MD  clobetasol cream (TEMOVATE) 6.38 % Apply 1 application topically 2 (two) times daily as needed (Dry Skin).  07/21/12   Historical Provider, MD  diltiazem (DILACOR XR) 240 MG 24 hr capsule Take 240 mg by mouth daily.      Historical Provider, MD  docusate sodium (COLACE) 100 MG capsule  Take 300 mg by mouth 2 (two) times daily.    Historical Provider, MD  ferrous fumarate (HEMOCYTE - 106 MG FE) 325 (106 FE) MG TABS tablet Take 1 tablet by mouth 2 (two) times daily.    Historical Provider, MD  finasteride (PROSCAR) 5 MG tablet Take 5 mg by mouth daily.      Historical Provider, MD  folic acid (FOLVITE) 1 MG tablet Take 1 mg by mouth daily.    Historical Provider, MD  galantamine (RAZADYNE) 8 MG tablet Take 8 mg by mouth 2 (two) times daily.      Historical Provider, MD  glyBURIDE (DIABETA) 5 MG tablet Take 5-10 mg by mouth 2 (two) times daily with a meal. 2 in the morning and 1 tab at night    Historical Provider, MD  guaiFENesin 200 MG tablet Take 400 mg by mouth 2 (two) times daily.    Historical Provider, MD  Insulin Glargine (LANTUS SOLOSTAR) 100 UNIT/ML Solostar Pen Inject 30 Units into the skin daily at 10 pm.    Historical Provider, MD  levothyroxine (SYNTHROID, LEVOTHROID) 112 MCG tablet Take 124 mcg by mouth at bedtime.    Historical Provider, MD  lisinopril (PRINIVIL,ZESTRIL) 20 MG tablet Take 10 mg by mouth daily.    Historical Provider, MD  loratadine (CLARITIN) 10 MG tablet Take 10 mg by mouth daily.      Historical Provider, MD  magnesium oxide (MAG-OX) 400 MG tablet Take 400 mg by mouth 2 (two) times daily.     Historical Provider, MD  Multiple Vitamin (MULTIVITAMIN WITH MINERALS) TABS tablet Take 1 tablet by mouth daily.    Historical Provider, MD  omeprazole (PRILOSEC) 20 MG capsule Take 20 mg by mouth 2 (two) times daily.      Historical Provider, MD  ondansetron (ZOFRAN) 4 MG tablet Take 1 tablet by mouth every 4 (four) hours as needed for nausea or vomiting.  11/07/15   Historical Provider, MD  oxybutynin (DITROPAN) 5 MG tablet Take 5 mg by mouth 2 (two) times daily.     Historical Provider, MD  predniSONE (DELTASONE) 10 MG tablet Take 4 tablets (40 mg total) by mouth daily with breakfast. Take 4 daily 3 days, 3 daily 3 days, 2 daily 3 days 1 daily 3 days 11/25/15    Sinda Du, MD  rosuvastatin (CRESTOR) 20 MG tablet Take 1 tablet (20 mg total) by mouth daily. 09/16/14   Arnoldo Lenis, MD  sulfamethoxazole-trimethoprim (BACTRIM DS,SEPTRA DS) 800-160 MG tablet Take 1 tablet by mouth every 12 (twelve) hours. 11/25/15   Sinda Du, MD  warfarin (COUMADIN) 5 MG tablet Take 1 tablet (5 mg total) by mouth at bedtime. Stop taking for 1 week 11/26/15   Sinda Du, MD   Physical Exam: Filed Vitals:   03/05/16 0802 03/05/16 0900  BP: 148/80 116/72  Pulse: 96 88  Temp: 98.3  F (36.8 C)   Resp: 18 23  Height: '5\' 10"'$  (1.778 m)   Weight: 86.183 kg (190 lb)   SpO2: 96% 95%    Constitutional:  . Appears calm and comfortable Eyes:  . PERRL and irises appear normal . Normal conjunctivae and lids ENMT:  . hard of hearing, . Lips appear normal;  . Oropharynx: mucosa, tongue, appear normal Neck:  . neck appears normal, no masses . no thyromegaly Respiratory:  . CTA bilaterally, no w/r/r.  . Respiratory effort normal. No retractions or accessory muscle use Cardiovascular:  . RRR, no m/r/g . No LE extremity edema   Abdomen:  . Abdomen appears normal; no tenderness or masses . No HSM Musculoskeletal:  . Digits/nails: no clubbing, cyanosis, petechiae, infection .  RUE, LUE, RLE, LLE   o strength and tone normal, no atrophy, no abnormal movements o No tenderness, masses o Normal ROM, no contractures  Skin:  . No rashes, lesions, ulcers . palpation of skin: no induration or nodules Psychiatric:  . judgement and insight appear normal . Mental status o Mood, affect appropriate  Wt Readings from Last 3 Encounters:  03/05/16 86.183 kg (190 lb)  11/24/15 85.276 kg (188 lb)  09/16/14 97.07 kg (214 lb)    Labs on Admission:  Basic Metabolic Panel:  Recent Labs Lab 03/05/16 0811  NA 141  K 4.5  CL 107  CO2 23  GLUCOSE 148*  BUN 23*  CREATININE 0.73  CALCIUM 8.8*    Liver Function Tests:  Recent Labs Lab 03/05/16 0811    AST 24  ALT 18  ALKPHOS 74  BILITOT 0.7  PROT 6.9  ALBUMIN 4.0    CBC:  Recent Labs Lab 03/05/16 0811  WBC 7.6  NEUTROABS 4.8  HGB 14.3  HCT 44.8  MCV 92.0  PLT 234     Principal Problem:   Lower GI bleed Active Problems:   Coronary atherosclerosis   Atrial fibrillation (HCC)   Diabetes mellitus without complication (HCC)   Acute GI bleeding   Assessment/Plan 1. Lower GI bleed. Known diverticulosis, most likely etiology. Complicated by warfarin therapy. No NSAID use. Hgb WNL. Hemodynamics stable. 2. Atrial fibrillation on warfarin.  3. Diabetes mellitus, stable. Continue home medications and started on SSI.  4. CAD, asymptomatic.   Obs to med/surg under Dr. Luan Pulling  NPO (but has already eaten lunch), GI consult, trend Hgb, hold warfarin. If further bleeding or Hgb dips consider reversing warfarin.  SSI  Code Status: Full DVT prophylaxis: SCDs Family Communication: Wife and daughter bedside  Disposition Plan/Anticipated LOS: Admit to med/surg  Time spent: 7 minutes  Murray Hodgkins, MD  [facility] Direct contact:  --Via amion app OR --[email or web address]; password [email or web address] and click   0/96/2836, 6:29 AM  . By signing my name below, I, Rennis Harding attest that this documentation has been prepared under the direction and in the presence of Murray Hodgkins, MD Electronically signed: Rennis Harding  03/05/2016 12:39pm  I personally performed the services described in this documentation. All medical record entries made by the scribe were at my direction. I have reviewed the chart and agree that the record reflects my personal performance and is accurate and complete. Murray Hodgkins, MD

## 2016-03-05 NOTE — ED Provider Notes (Signed)
CSN: [id number]     Arrival date & time 03/05/16  0745 History   First MD Initiated Contact with Patient 03/05/16 [phone]     Chief Complaint  Patient presents with  . Rectal Bleeding     (Consider location/radiation/quality/duration/timing/severity/associated sxs/prior Treatment) HPI Comments: The patient is the primary historian, I have also reviewed the electronic medical record for additional information. The patient arrives at 8:00 this morning with a complaint of gastrointestinal bleeding which started this morning. He reports having 3 large dark blood containing stools. He has felt some lower abdominal cramping. This is persistent this morning, nothing makes this better or worse, it is not associated with any vomiting or nausea. The patient was admitted to the hospital in December 2016 during which time he had both a pneumonia and a lower GI bleed. He underwent endoscopy, colonoscopy, capsule endoscopy and no definite source of bleeding was found though he was noted to have diffuse diverticulosis from his rectosigmoid to his cecum.  The patient denies any coughing, shortness of breath, headache, back pain, swelling or any other complaints  Patient is a 81 y.o. male presenting with hematochezia. The history is provided by the patient and medical records.  Rectal Bleeding   Past Medical History  Diagnosis Date  . Acute exacerbation of chronic bronchitis ([place])   . History of tuberculosis   . CAD (coronary artery disease)   . Hyperlipidemia   . BPH (benign prostatic hypertrophy)   . Thyroid disease     hyperthyroidism  . Diabetes mellitus without complication ([place])   . GERD (gastroesophageal reflux disease)   . Diverticulitis   . GI bleed   . Anemia    Past Surgical History  Procedure Laterality Date  . Cholecystectomy    . Thyroidectomy    . Ercp  09/2005    Dr. Laural Golden: biliary leak, ERCP with stent placement  . Ercp  11/2005    Dr. Laural Golden: ERCP with removal of biliary stent  and sphincterotomy   . Esophagogastroduodenoscopy N/A 11/23/2015    Procedure: ESOPHAGOGASTRODUODENOSCOPY (EGD);  Surgeon: Daneil Dolin, MD;  [facility];  Service: Endoscopy;  Laterality: N/A;  with possible dilation   . Colonoscopy N/A 11/24/2015    Procedure: COLONOSCOPY;  Surgeon: Daneil Dolin, MD;  [facility];  Service: Endoscopy;  Laterality: N/A;  . Givens capsule study N/A 11/24/2015    Procedure: GIVENS CAPSULE STUDY;  Surgeon: Daneil Dolin, MD;  [facility];  Service: Endoscopy;  Laterality: N/A;   Family History  Problem Relation Age of Onset  . Heart attack Father   . Heart failure Mother   . Colon cancer Neg Hx   . Stomach cancer Sister     deceased in her 73s from stomach cancer   Social History  Substance Use Topics  . Smoking status: Former Smoker -- 0.75 packs/day for 50 years    Types: Cigarettes    Start date: 09/16/1944    Quit date: 10/17/1998  . Smokeless tobacco: Never Used  . Alcohol Use: No    Review of Systems  Gastrointestinal: Positive for hematochezia.  All other systems reviewed and are negative.     Allergies  Levaquin  Home Medications   Prior to Admission medications   Medication Sig Start Date End Date Taking? Authorizing Provider  allopurinol (ZYLOPRIM) 300 MG tablet Take 300 mg by mouth daily.    Historical Provider, MD  amLODipine (NORVASC) 5 MG tablet Take 1 tablet (5  mg total) by mouth daily. 09/16/14   Arnoldo Lenis, MD  Ascorbic Acid (VITAMIN C) 500 MG tablet Take 500 mg by mouth 2 (two) times daily.     Historical Provider, MD  aspirin EC 81 MG tablet Take 81 mg by mouth daily.    Historical Provider, MD  cholecalciferol (VITAMIN D) 1000 UNITS tablet Take 4,000 Units by mouth daily.    Historical Provider, MD  clobetasol cream (TEMOVATE) 4.58 % Apply 1 application topically 2 (two) times daily as needed (Dry Skin).  07/21/12   Historical Provider, MD  diltiazem (DILACOR XR) 240 MG 24 hr  capsule Take 240 mg by mouth daily.      Historical Provider, MD  docusate sodium (COLACE) 100 MG capsule Take 300 mg by mouth 2 (two) times daily.    Historical Provider, MD  ferrous fumarate (HEMOCYTE - 106 MG FE) 325 (106 FE) MG TABS tablet Take 1 tablet by mouth 2 (two) times daily.    Historical Provider, MD  finasteride (PROSCAR) 5 MG tablet Take 5 mg by mouth daily.      Historical Provider, MD  folic acid (FOLVITE) 1 MG tablet Take 1 mg by mouth daily.    Historical Provider, MD  galantamine (RAZADYNE) 8 MG tablet Take 8 mg by mouth 2 (two) times daily.      Historical Provider, MD  glyBURIDE (DIABETA) 5 MG tablet Take 5-10 mg by mouth 2 (two) times daily with a meal. 2 in the morning and 1 tab at night    Historical Provider, MD  guaiFENesin 200 MG tablet Take 400 mg by mouth 2 (two) times daily.    Historical Provider, MD  Insulin Glargine (LANTUS SOLOSTAR) 100 UNIT/ML Solostar Pen Inject 30 Units into the skin daily at 10 pm.    Historical Provider, MD  lisinopril (PRINIVIL,ZESTRIL) 20 MG tablet Take 10 mg by mouth daily.    Historical Provider, MD  loratadine (CLARITIN) 10 MG tablet Take 10 mg by mouth daily.      Historical Provider, MD  magnesium oxide (MAG-OX) 400 MG tablet Take 400 mg by mouth 2 (two) times daily.     Historical Provider, MD  Multiple Vitamin (MULTIVITAMIN WITH MINERALS) TABS tablet Take 1 tablet by mouth daily.    Historical Provider, MD  omeprazole (PRILOSEC) 20 MG capsule Take 20 mg by mouth 2 (two) times daily.      Historical Provider, MD  ondansetron (ZOFRAN) 4 MG tablet Take 1 tablet by mouth every 4 (four) hours as needed for nausea or vomiting.  11/07/15   Historical Provider, MD  oxybutynin (DITROPAN) 5 MG tablet Take 5 mg by mouth 2 (two) times daily.     Historical Provider, MD  predniSONE (DELTASONE) 10 MG tablet Take 4 tablets (40 mg total) by mouth daily with breakfast. Take 4 daily 3 days, 3 daily 3 days, 2 daily 3 days 1 daily 3 days 11/25/15   Sinda Du, MD  rosuvastatin (CRESTOR) 20 MG tablet Take 1 tablet (20 mg total) by mouth daily. 09/16/14   Arnoldo Lenis, MD  sulfamethoxazole-trimethoprim (BACTRIM DS,SEPTRA DS) 800-160 MG tablet Take 1 tablet by mouth every 12 (twelve) hours. 11/25/15   Sinda Du, MD  warfarin (COUMADIN) 5 MG tablet Take 1 tablet (5 mg total) by mouth at bedtime. Stop taking for 1 week 11/26/15   Sinda Du, MD   BP 116/72 mmHg  Pulse 88  Temp(Src) 98.3 F (36.8 C)  Resp 23  Ht 5'  10" (1.778 m)  Wt 190 lb (86.183 kg)  BMI 27.26 kg/m2  SpO2 95% Physical Exam  Constitutional: He appears well-developed and well-nourished. No distress.  HENT:  Head: Normocephalic and atraumatic.  Mouth/Throat: Oropharynx is clear and moist. No oropharyngeal exudate.  Eyes: Conjunctivae and EOM are normal. Pupils are equal, round, and reactive to light. Right eye exhibits no discharge. Left eye exhibits no discharge. No scleral icterus.  Neck: Normal range of motion. Neck supple. No JVD present. No thyromegaly present.  Cardiovascular: Normal rate, normal heart sounds and intact distal pulses.  Exam reveals no gallop and no friction rub.   No murmur heard. Irregularly irregular rhythm  Pulmonary/Chest: Effort normal. No respiratory distress. He has wheezes. He has no rales.  Mild diffuse expiratory wheezing, no increased work of breathing or respiratory distress  Abdominal: Soft. Bowel sounds are normal. He exhibits no distension and no mass. There is tenderness (lower abdominal tenderness, no guarding).  Genitourinary:  Chaperone present - Has dark red blood / melena type stool in vault - no hemorrhoids / fissures / masses  Musculoskeletal: Normal range of motion. He exhibits no edema or tenderness.  Lymphadenopathy:    He has no cervical adenopathy.  Neurological: He is alert. Coordination normal.  Skin: Skin is warm and dry. No rash noted. No erythema.  Psychiatric: He has a normal mood and affect. His  behavior is normal.  Nursing note and vitals reviewed.   ED Course  Procedures (including critical care time) Labs Review Labs Reviewed  COMPREHENSIVE METABOLIC PANEL - Abnormal; Notable for the following:    Glucose, Bld 148 (*)    BUN 23 (*)    Calcium 8.8 (*)    All other components within normal limits  PROTIME-INR - Abnormal; Notable for the following:    Prothrombin Time 27.9 (*)    INR 2.65 (*)    All other components within normal limits  POC OCCULT BLOOD, ED - Abnormal; Notable for the following:    Fecal Occult Bld POSITIVE (*)    All other components within normal limits  CBC WITH DIFFERENTIAL/PLATELET  TYPE AND SCREEN    Imaging Review No results found. I have personally reviewed and evaluated these images and lab results as part of my medical decision-making.    MDM   Final diagnoses:  Acute GI bleeding    The patient is on Coumadin for atrial fibrillation, his vital signs are unremarkable, he did require 4 units of packed red blood cells during his last admission. With recurrent GI bleeding I suspect he will need to be admitted to the hospital again, check labs, anticipate admission.  Pt agreeable to plan for admission.   Reverse coumadin as needed  INR elevated CBC without anemia Needs admit due to ongoing GI bleed though VS normal at this time. Pt in agreement  D/w Dr. Sarajane Jews who agrees to obs admit -med surg - holding orders placed.      Noemi Chapel, MD 03/05/16 1004

## 2016-03-05 NOTE — Consult Note (Signed)
Referring Provider: Dr. Sarajane Jews Primary Care Physician:  Alonza Bogus, MD Primary Gastroenterologist:  Dr. Oneida Alar  Date of Admission: 03/05/16 Date of Consultation: 03/05/16  Reason for Consultation:  GI bleed  HPI:  Caleb [name] is a 81 y.o. year old male who was recently inpatient Dec 2016 with acute blood loss anemia (Hgb 6.8) and melena in setting of anticoagulation for afib. EGD, colonoscopy, and capsule study all completed while inpatient. Normal esophagus s/p dilation and duodenal diverticulum, pancolonic diverticulosis and tubular adenomas. Capsule study incomplete but was unremarkable overall. Patient presenting now with reports of dark bloody stools.  INR 2.65. Admitting Hgb 14.3, now 12.9 as of this afternoon.   States he has had 6 episodes of rectal bleeding today, dark red. No rectal discomfort. . Notes lower abdominal discomfort cramping but no real pain. States just like in December but doesn't really hurt. Acute onset of rectal bleeding this morning. Takes iron, which he knows makes your stool black. Noted about 2-3 days ago started getting "slick". States it was black and slick. Now red blood. Tapered off some since this morning. ED rectal exam with dark red blood/melena type stool in vault, no hemorhroids/fissures/masses.    Past Medical History  Diagnosis Date  . Acute exacerbation of chronic bronchitis ([place])   . History of tuberculosis   . CAD (coronary artery disease)   . Hyperlipidemia   . BPH (benign prostatic hypertrophy)   . Hyperthyroidism   . Diabetes mellitus without complication ([place])   . GERD (gastroesophageal reflux disease)   . Diverticulitis   . GI bleed   . Anemia   . Diverticulosis     Past Surgical History  Procedure Laterality Date  . Cholecystectomy    . Thyroidectomy    . Ercp  09/2005    Dr. Laural Golden: biliary leak, ERCP with stent placement  . Ercp  11/2005    Dr. Laural Golden: ERCP with removal of biliary stent and sphincterotomy   .  Esophagogastroduodenoscopy N/A 11/23/2015    Dr. Gala Romney: normal esophagus, s/p dilation. duodenal diverticulum  . Colonoscopy N/A 11/24/2015    Dr. Gala Romney: pancolonic diverticulosis, polys removed and path consistent with adenomas  . [place] capsule study N/A 11/24/2015    unremarkable, however was incomplete. No obvious bleeding source  . Knee arthroscopy      Prior to Admission medications   Medication Sig Start Date End Date Taking? Authorizing Provider  allopurinol (ZYLOPRIM) 300 MG tablet Take 300 mg by mouth daily.   Yes Historical Provider, MD  amLODipine (NORVASC) 5 MG tablet Take 1 tablet (5 mg total) by mouth daily. 09/16/14  Yes Arnoldo Lenis, MD  Ascorbic Acid (VITAMIN C) 500 MG tablet Take 500 mg by mouth 2 (two) times daily.    Yes Historical Provider, MD  aspirin EC 81 MG tablet Take 81 mg by mouth daily.   Yes Historical Provider, MD  cholecalciferol (VITAMIN D) 1000 UNITS tablet Take 4,000 Units by mouth daily.   Yes Historical Provider, MD  clobetasol cream (TEMOVATE) 3.76 % Apply 1 application topically 2 (two) times daily as needed (Dry Skin).  07/21/12  Yes Historical Provider, MD  diltiazem (DILACOR XR) 240 MG 24 hr capsule Take 240 mg by mouth daily.     Yes Historical Provider, MD  docusate sodium (COLACE) 100 MG capsule Take 300 mg by mouth 2 (two) times daily.   Yes Historical Provider, MD  ferrous fumarate (HEMOCYTE - 106 MG FE) 325 (106 FE) MG TABS tablet Take 1  tablet by mouth 2 (two) times daily.   Yes Historical Provider, MD  finasteride (PROSCAR) 5 MG tablet Take 5 mg by mouth daily.     Yes Historical Provider, MD  folic acid (FOLVITE) 1 MG tablet Take 1 mg by mouth daily.   Yes Historical Provider, MD  galantamine (RAZADYNE) 8 MG tablet Take 8 mg by mouth 2 (two) times daily.     Yes Historical Provider, MD  glyBURIDE (DIABETA) 5 MG tablet Take 5-10 mg by mouth 2 (two) times daily with a meal. 2 in the morning and 1 tab at night   Yes Historical Provider, MD   guaiFENesin 200 MG tablet Take 400 mg by mouth 2 (two) times daily.   Yes Historical Provider, MD  Insulin Glargine (LANTUS SOLOSTAR) 100 UNIT/ML Solostar Pen Inject 24 Units into the skin daily at 10 pm.    Yes Historical Provider, MD  levothyroxine (SYNTHROID, LEVOTHROID) 125 MCG tablet Take 250 mcg by mouth daily before breakfast.   Yes Historical Provider, MD  lisinopril (PRINIVIL,ZESTRIL) 20 MG tablet Take 20 mg by mouth daily.    Yes Historical Provider, MD  loratadine (CLARITIN) 10 MG tablet Take 10 mg by mouth daily.     Yes Historical Provider, MD  magnesium oxide (MAG-OX) 400 MG tablet Take 400 mg by mouth 2 (two) times daily.    Yes Historical Provider, MD  meclizine (ANTIVERT) 25 MG tablet Take 1 tablet by mouth daily as needed (inner ear).  01/23/16  Yes Historical Provider, MD  Multiple Vitamin (MULTIVITAMIN WITH MINERALS) TABS tablet Take 1 tablet by mouth daily.   Yes Historical Provider, MD  omeprazole (PRILOSEC) 20 MG capsule Take 20 mg by mouth 2 (two) times daily.     Yes Historical Provider, MD  oxybutynin (DITROPAN) 5 MG tablet Take 5 mg by mouth 2 (two) times daily.    Yes Historical Provider, MD  Pseudoeph-Doxylamine-DM-APAP (NYQUIL PO) Take 10 mLs by mouth at bedtime.   Yes Historical Provider, MD  warfarin (COUMADIN) 5 MG tablet Take 1 tablet (5 mg total) by mouth at bedtime. Stop taking for 1 week Patient taking differently: Take 2.5-5 mg by mouth at bedtime. 5 mg once daily except on Sunday take 2.5 mg. 11/26/15  Yes Sinda Du, MD  ondansetron (ZOFRAN) 4 MG tablet Take 1 tablet by mouth every 4 (four) hours as needed for nausea or vomiting.  11/07/15   Historical Provider, MD  predniSONE (DELTASONE) 10 MG tablet Take 4 tablets (40 mg total) by mouth daily with breakfast. Take 4 daily 3 days, 3 daily 3 days, 2 daily 3 days 1 daily 3 days Patient not taking: Reported on 03/05/2016 11/25/15   Sinda Du, MD  rosuvastatin (CRESTOR) 20 MG tablet Take 1 tablet (20 mg  total) by mouth daily. 09/16/14   Arnoldo Lenis, MD  sulfamethoxazole-trimethoprim (BACTRIM DS,SEPTRA DS) 800-160 MG tablet Take 1 tablet by mouth every 12 (twelve) hours. Patient not taking: Reported on 03/05/2016 11/25/15   Sinda Du, MD    Current Facility-Administered Medications  Medication Dose Route Frequency Provider Last Rate Last Dose  . 0.9 %  sodium chloride infusion  250 mL Intravenous PRN Samuella Cota, MD      . acetaminophen (TYLENOL) tablet 650 mg  650 mg Oral Q6H PRN Samuella Cota, MD       Or  . acetaminophen (TYLENOL) suppository 650 mg  650 mg Rectal Q6H PRN Samuella Cota, MD      .  allopurinol (ZYLOPRIM) tablet 300 mg  300 mg Oral Daily Samuella Cota, MD   300 mg at 03/05/16 1300  . amLODipine (NORVASC) tablet 5 mg  5 mg Oral Daily Samuella Cota, MD   5 mg at 03/05/16 1300  . diltiazem (CARDIZEM CD) 24 hr capsule 240 mg  240 mg Oral Daily Samuella Cota, MD   240 mg at 03/05/16 1300  . galantamine (RAZADYNE) tablet 8 mg  8 mg Oral BID Samuella Cota, MD      . insulin aspart (novoLOG) injection 0-9 Units  0-9 Units Subcutaneous Q6H Samuella Cota, MD      . insulin glargine (LANTUS) injection 24 Units  24 Units Subcutaneous Q2200 Samuella Cota, MD      . Derrill Memo ON 03/06/2016] levothyroxine (SYNTHROID, LEVOTHROID) tablet 250 mcg  250 mcg Oral QAC breakfast Samuella Cota, MD      . lisinopril (PRINIVIL,ZESTRIL) tablet 20 mg  20 mg Oral Daily Samuella Cota, MD   20 mg at 03/05/16 1300  . ondansetron (ZOFRAN) tablet 4 mg  4 mg Oral Q6H PRN Samuella Cota, MD       Or  . ondansetron [facility]) injection 4 mg  4 mg Intravenous Q6H PRN Samuella Cota, MD      . pantoprazole (PROTONIX) EC tablet 40 mg  40 mg Oral Daily Samuella Cota, MD   40 mg at 03/05/16 1300  . rosuvastatin (CRESTOR) tablet 20 mg  20 mg Oral q1800 Samuella Cota, MD      . sodium chloride flush (NS) 0.9 % injection 3 mL  3 mL Intravenous Q12H Samuella Cota, MD      . sodium chloride flush (NS) 0.9 % injection 3 mL  3 mL Intravenous PRN Samuella Cota, MD        Allergies as of 03/05/2016 - Review Complete 03/05/2016  Allergen Reaction Noted  . Levaquin [levofloxacin in d5w]  09/02/2013    Family History  Problem Relation Age of Onset  . Heart attack Father   . Heart failure Mother   . Colon cancer Neg Hx   . Stomach cancer Sister     deceased in her 2s from stomach cancer    Social History   Social History  . Marital Status: Married    Spouse Name: N/A  . Number of Children: N/A  . Years of Education: N/A   Occupational History  . Retired    Social History Main Topics  . Smoking status: Former Smoker -- 0.75 packs/day for 50 years    Types: Cigarettes    Start date: 09/16/1944    Quit date: 10/17/1998  . Smokeless tobacco: Never Used  . Alcohol Use: No  . Drug Use: No  . Sexual Activity: Not on file   Other Topics Concern  . Not on file   Social History Narrative   Married   No regular exercise   Was in [place] for some years   Used to work in [employer] fund[employer] and subsequently   Has a [place] MD in [place] Dr. Royce Macadamia    Review of Systems: Gen: Denies fever, chills, loss of appetite, change in weight or weight loss CV: Denies chest pain, heart palpitations, syncope, edema  Resp: +cough  GI: see HPI  GU : Denies urinary burning, urinary frequency, urinary incontinence.  MS: Denies joint pain,swelling, cramping Derm: Denies rash, itching, dry skin Psych: Denies depression, anxiety,confusion, or  memory loss Heme: see HPI   Physical Exam: Vital signs in last 24 hours: Temp:  [97.6 F (36.4 C)-98.3 F (36.8 C)] 97.6 F (36.4 C) (03/20 1306) Pulse Rate:  [72-96] 72 (03/20 1306) Resp:  [12-23] 12 (03/20 1306) BP: (92-148)/(49-80) 122/49 mmHg (03/20 1306) SpO2:  [95 %-96 %] 95 % (03/20 1306) Weight:  [190 lb (86.183 kg)] 190 lb (86.183 kg) (03/20 0802)   General:   Alert,   Well-developed, well-nourished, pleasant and cooperative in NAD Head:  Normocephalic and atraumatic. Eyes:  Sclera clear, no icterus.   Conjunctiva pink. Ears:  Normal auditory acuity. Nose:  No deformity, discharge,  or lesions. Mouth:  No deformity or lesions, dentition normal. Lungs:  Scattered rhonchi, coarse Heart:  S1 S2 present  Abdomen:  Soft, nontender and nondistended. Large ventral hernia Rectal:  Deferred Msk:  Symmetrical without gross deformities. Normal posture. Extremities:  Without edema. Neurologic:  Alert and  oriented x4;  grossly normal neurologically. Psych:  Alert and cooperative. Normal mood and affect.  Intake/Output from previous day:   Intake/Output this shift: Total I/O In: 240 [P.O.:240] Out: -   Lab Results:  Recent Labs  03/05/16 0811 03/05/16 1433  WBC 7.6 7.1  HGB 14.3 12.9*  HCT 44.8 39.9  PLT 234 199   BMET  Recent Labs  03/05/16 0811  NA 141  K 4.5  CL 107  CO2 23  GLUCOSE 148*  BUN 23*  CREATININE 0.73  CALCIUM 8.8*   LFT  Recent Labs  03/05/16 0811  PROT 6.9  ALBUMIN 4.0  AST 24  ALT 18  ALKPHOS 74  BILITOT 0.7   PT/INR  Recent Labs  03/05/16 0811  LABPROT 27.9*  INR 2.65*    Impression: 81 year old male presenting with recent GI bleed in Dec 2016 in setting of Coumadin, s/p colonoscopy, EGD, and capsule study, now presenting with recurrent GI bleed while on anticoagulation. Known diverticulosis on exam, and capsule study was incomplete but overall unremarkable. Unclear etiology but with differentials to include diverticular origin, small bowel etiology, doubt rapid transit upper GI as previous EGD overall unimpressive. Risks and benefits of anticoagulation will need to be discussed with cardiology. Does not appear to be hemodynamically significant at this time and tapering off. Will need to monitor H/H closely, supportive measures, and determine best route to take for chronic anticoagulation. No endoscopic  evaluation warranted at this time.   Plan: May have full liquids Serial H/H Supportive measures Recommend cardiology input regarding risks and benefits of anticoagulation in this individual.   Orvil Feil, ANP-BC [facility]         03/05/2016, 4:13 PM

## 2016-03-05 NOTE — Progress Notes (Signed)
Notified Adline Potter the Financial advisor per the patients request.  I initially left her a message and I also gave Mr. Mccaslin her number to her office.   Mr. Zaremba notified her himself and talked with her.  I then received a call from her that said that the patient was irate with her, and that he was yelling at her.  She stated that the patient wanted his billing to be sent  [facility].  She stated that for what he was here for billing could not be sent she explained to him why.  According to her he was not happy with her answer and he [facility]  So she called me.  I notified both Dr. Sarajane Jews and Jolene Provost case manager about the situation and it was that the process would be stated and they would talk with him tomorrow.

## 2016-03-06 ENCOUNTER — Telehealth: Payer: Self-pay | Admitting: General Practice

## 2016-03-06 DIAGNOSIS — I4891 Unspecified atrial fibrillation: Secondary | ICD-10-CM | POA: Diagnosis not present

## 2016-03-06 DIAGNOSIS — E119 Type 2 diabetes mellitus without complications: Secondary | ICD-10-CM | POA: Diagnosis not present

## 2016-03-06 DIAGNOSIS — E785 Hyperlipidemia, unspecified: Secondary | ICD-10-CM | POA: Diagnosis not present

## 2016-03-06 DIAGNOSIS — I2510 Atherosclerotic heart disease of native coronary artery without angina pectoris: Secondary | ICD-10-CM | POA: Diagnosis not present

## 2016-03-06 DIAGNOSIS — K922 Gastrointestinal hemorrhage, unspecified: Secondary | ICD-10-CM

## 2016-03-06 LAB — GLUCOSE, CAPILLARY
GLUCOSE-CAPILLARY: 111 mg/dL — AB (ref 65–99)
GLUCOSE-CAPILLARY: 153 mg/dL — AB (ref 65–99)
GLUCOSE-CAPILLARY: 202 mg/dL — AB (ref 65–99)
GLUCOSE-CAPILLARY: 202 mg/dL — AB (ref 65–99)
Glucose-Capillary: 149 mg/dL — ABNORMAL HIGH (ref 65–99)
Glucose-Capillary: 191 mg/dL — ABNORMAL HIGH (ref 65–99)
Glucose-Capillary: 157 mg/dL — ABNORMAL HIGH (ref 65–99)

## 2016-03-06 LAB — BASIC METABOLIC PANEL
Anion gap: 6 (ref 5–15)
BUN: 23 mg/dL — AB (ref 6–20)
CHLORIDE: 104 mmol/L (ref 101–111)
CO2: 27 mmol/L (ref 22–32)
CREATININE: 0.66 mg/dL (ref 0.61–1.24)
Calcium: 9.1 mg/dL (ref 8.9–10.3)
GFR calc Af Amer: >60 mL/min (ref >60)
GFR calc non Af Amer: >60 mL/min (ref >60)
GLUCOSE: 146 mg/dL — AB (ref 65–99)
POTASSIUM: 4.2 mmol/L (ref 3.5–5.1)
SODIUM: 137 mmol/L (ref 135–145)

## 2016-03-06 LAB — CBC
HEMATOCRIT: 39.7 % (ref 39.0–52.0)
HEMOGLOBIN: 12.9 g/dL — AB (ref 13.0–17.0)
MCH: 29.6 pg (ref 26.0–34.0)
MCHC: 32.5 g/dL (ref 30.0–36.0)
MCV: 91.1 fL (ref 78.0–100.0)
Platelets: 188 10*3/uL (ref 150–400)
RBC: 4.36 MIL/uL (ref 4.22–5.81)
RDW: 14.3 % (ref 11.5–15.5)
WBC: 6.9 10*3/uL (ref 4.0–10.5)

## 2016-03-06 MED ORDER — PANTOPRAZOLE SODIUM 40 MG PO TBEC
40.0000 mg | DELAYED_RELEASE_TABLET | Freq: Every day | ORAL | Status: DC
  Administered 2016-03-06 – 2016-03-07 (×2): 40 mg via ORAL
  Filled 2016-03-06 (×2): qty 1

## 2016-03-06 NOTE — Progress Notes (Signed)
Subjective: He says he feels okay. He has no new complaints. He still having some bloody stools. He is concerned about his VA benefits.  Objective: Vital signs in last 24 hours: Temp:  [97.5 F (36.4 C)-97.7 F (36.5 C)] 97.5 F (36.4 C) (03/21 0428) Pulse Rate:  [72-81] 72 (03/21 0428) Resp:  [12-18] 16 (03/21 0428) BP: (92-135)/(49-74) 135/66 mmHg (03/21 0428) SpO2:  [95 %-96 %] 96 % (03/21 0428) Weight change:  Last BM Date: 03/05/16  Intake/Output from previous day: 03/20 0701 - 03/21 0700 In: 243 [P.O.:240; I.V.:3] Out: -   PHYSICAL EXAM General appearance: alert, cooperative and mild distress Resp: rhonchi bilaterally Cardio: irregularly irregular rhythm GI: soft, non-tender; bowel sounds normal; no masses,  no organomegaly Extremities: extremities normal, atraumatic, no cyanosis or edema  Lab Results:  Results for orders placed or performed during the hospital encounter of 03/05/16 (from the past 48 hour(s))  CBC with Differential/Platelet     Status: None   Collection Time: 03/05/16  8:11 AM  Result Value Ref Range   WBC 7.6 4.0 - 10.5 K/uL   RBC 4.87 4.22 - 5.81 MIL/uL   Hemoglobin 14.3 13.0 - 17.0 g/dL   HCT 44.8 39.0 - 52.0 %   MCV 92.0 78.0 - 100.0 fL   MCH 29.4 26.0 - 34.0 pg   MCHC 31.9 30.0 - 36.0 g/dL   RDW 14.4 11.5 - 15.5 %   Platelets 234 150 - 400 K/uL   Neutrophils Relative % 62 %   Neutro Abs 4.8 1.7 - 7.7 K/uL   Lymphocytes Relative 26 %   Lymphs Abs 2.0 0.7 - 4.0 K/uL   Monocytes Relative 8 %   Monocytes Absolute 0.6 0.1 - 1.0 K/uL   Eosinophils Relative 3 %   Eosinophils Absolute 0.2 0.0 - 0.7 K/uL   Basophils Relative 1 %   Basophils Absolute 0.0 0.0 - 0.1 K/uL  Comprehensive metabolic panel     Status: Abnormal   Collection Time: 03/05/16  8:11 AM  Result Value Ref Range   Sodium 141 135 - 145 mmol/[name]   Potassium 4.5 3.5 - 5.1 mmol/[name]   Chloride 107 101 - 111 mmol/[name]   CO2 23 22 - 32 mmol/[name]   Glucose, Bld 148 (H) 65 - 99 mg/dL   BUN  23 (H) 6 - 20 mg/dL   Creatinine, Ser 0.73 0.61 - 1.24 mg/dL   Calcium 8.8 ([name]) 8.9 - 10.3 mg/dL   Total Protein 6.9 6.5 - 8.1 g/dL   Albumin 4.0 3.5 - 5.0 g/dL   AST 24 15 - 41 U/[name]   ALT 18 17 - 63 U/[name]   Alkaline Phosphatase 74 38 - 126 U/[name]   Total Bilirubin 0.7 0.3 - 1.2 mg/dL   GFR calc non Af Amer >60 >60 mL/min   GFR calc Af Amer >60 >60 mL/min    Comment: (NOTE) The eGFR has been calculated using the CKD EPI equation. This calculation has not been validated in all clinical situations. eGFR's persistently <60 mL/min signify possible Chronic Kidney Disease.    Anion gap 11 5 - 15  Type and screen     Status: None   Collection Time: 03/05/16  8:11 AM  Result Value Ref Range   ABO/RH(D) O POS    Antibody Screen NEG    Sample Expiration 03/08/2016   Protime-INR     Status: Abnormal   Collection Time: 03/05/16  8:11 AM  Result Value Ref Range   Prothrombin Time 27.9 (  H) 11.6 - 15.2 seconds   INR 2.65 (H) 0.00 - 1.49  POC occult blood, ED     Status: Abnormal   Collection Time: 03/05/16  8:16 AM  Result Value Ref Range   Fecal Occult Bld POSITIVE (A) NEGATIVE  CBC     Status: Abnormal   Collection Time: 03/05/16  2:33 PM  Result Value Ref Range   WBC 7.1 4.0 - 10.5 K/uL   RBC 4.34 4.22 - 5.81 MIL/uL   Hemoglobin 12.9 ([name]) 13.0 - 17.0 g/dL   HCT 39.9 39.0 - 52.0 %   MCV 91.9 78.0 - 100.0 fL   MCH 29.7 26.0 - 34.0 pg   MCHC 32.3 30.0 - 36.0 g/dL   RDW 14.4 11.5 - 15.5 %   Platelets 199 150 - 400 K/uL  MRSA PCR Screening     Status: None   Collection Time: 03/05/16  3:10 PM  Result Value Ref Range   MRSA by PCR NEGATIVE NEGATIVE    Comment:        The GeneXpert MRSA Assay (FDA approved for NASAL specimens only), is one component of a comprehensive MRSA colonization surveillance program. It is not intended to diagnose MRSA infection nor to guide or monitor treatment for MRSA infections.   Glucose, capillary     Status: Abnormal   Collection Time: 03/05/16  3:21  PM  Result Value Ref Range   Glucose-Capillary 133 (H) 65 - 99 mg/dL  CBC     Status: None   Collection Time: 03/05/16  7:31 PM  Result Value Ref Range   WBC 6.7 4.0 - 10.5 K/uL   RBC 4.36 4.22 - 5.81 MIL/uL   Hemoglobin 13.1 13.0 - 17.0 g/dL   HCT 40.0 39.0 - 52.0 %   MCV 91.7 78.0 - 100.0 fL   MCH 30.0 26.0 - 34.0 pg   MCHC 32.8 30.0 - 36.0 g/dL   RDW 14.5 11.5 - 15.5 %   Platelets 214 150 - 400 K/uL  Glucose, capillary     Status: Abnormal   Collection Time: 03/05/16  9:06 PM  Result Value Ref Range   Glucose-Capillary 188 (H) 65 - 99 mg/dL  Glucose, capillary     Status: Abnormal   Collection Time: 03/06/16 12:23 AM  Result Value Ref Range   Glucose-Capillary 111 (H) 65 - 99 mg/dL  CBC     Status: Abnormal   Collection Time: 03/06/16  2:01 AM  Result Value Ref Range   WBC 6.9 4.0 - 10.5 K/uL   RBC 4.36 4.22 - 5.81 MIL/uL   Hemoglobin 12.9 ([name]) 13.0 - 17.0 g/dL   HCT 39.7 39.0 - 52.0 %   MCV 91.1 78.0 - 100.0 fL   MCH 29.6 26.0 - 34.0 pg   MCHC 32.5 30.0 - 36.0 g/dL   RDW 14.3 11.5 - 15.5 %   Platelets 188 150 - 400 K/uL  Basic metabolic panel     Status: Abnormal   Collection Time: 03/06/16  2:01 AM  Result Value Ref Range   Sodium 137 135 - 145 mmol/[name]   Potassium 4.2 3.5 - 5.1 mmol/[name]   Chloride 104 101 - 111 mmol/[name]   CO2 27 22 - 32 mmol/[name]   Glucose, Bld 146 (H) 65 - 99 mg/dL   BUN 23 (H) 6 - 20 mg/dL   Creatinine, Ser 0.66 0.61 - 1.24 mg/dL   Calcium 9.1 8.9 - 10.3 mg/dL   GFR calc non Af Amer >60 >60 mL/min  GFR calc Af Amer >60 >60 mL/min    Comment: (NOTE) The eGFR has been calculated using the CKD EPI equation. This calculation has not been validated in all clinical situations. eGFR's persistently <60 mL/min signify possible Chronic Kidney Disease.    Anion gap 6 5 - 15  Glucose, capillary     Status: Abnormal   Collection Time: 03/06/16  4:26 AM  Result Value Ref Range   Glucose-Capillary 149 (H) 65 - 99 mg/dL  Glucose, capillary     Status:  Abnormal   Collection Time: 03/06/16  5:54 AM  Result Value Ref Range   Glucose-Capillary 191 (H) 65 - 99 mg/dL  Glucose, capillary     Status: Abnormal   Collection Time: 03/06/16  7:47 AM  Result Value Ref Range   Glucose-Capillary 153 (H) 65 - 99 mg/dL   Comment 1 Document in Chart     ABGS No results for input(s): PHART, PO2ART, TCO2, HCO3 in the last 72 hours.  Invalid input(s): PCO2 CULTURES Recent Results (from the past 240 hour(s))  MRSA PCR Screening     Status: None   Collection Time: 03/05/16  3:10 PM  Result Value Ref Range Status   MRSA by PCR NEGATIVE NEGATIVE Final    Comment:        The GeneXpert MRSA Assay (FDA approved for NASAL specimens only), is one component of a comprehensive MRSA colonization surveillance program. It is not intended to diagnose MRSA infection nor to guide or monitor treatment for MRSA infections.    Studies/Results: No results found.  Medications:  Prior to Admission:  Prescriptions prior to admission  Medication Sig Dispense Refill Last Dose  . allopurinol (ZYLOPRIM) 300 MG tablet Take 300 mg by mouth daily.   03/05/2016 at Unknown time  . amLODipine (NORVASC) 5 MG tablet Take 1 tablet (5 mg total) by mouth daily. 180 tablet 3 03/05/2016 at Unknown time  . Ascorbic Acid (VITAMIN C) 500 MG tablet Take 500 mg by mouth 2 (two) times daily.    03/05/2016 at Unknown time  . aspirin EC 81 MG tablet Take 81 mg by mouth daily.   03/04/2016 at 1830  . cholecalciferol (VITAMIN D) 1000 UNITS tablet Take 4,000 Units by mouth daily.   03/05/2016 at Unknown time  . clobetasol cream (TEMOVATE) 1.95 % Apply 1 application topically 2 (two) times daily as needed (Dry Skin).    unknown  . diltiazem (DILACOR XR) 240 MG 24 hr capsule Take 240 mg by mouth daily.     03/05/2016 at 0430  . docusate sodium (COLACE) 100 MG capsule Take 300 mg by mouth 2 (two) times daily.   03/05/2016 at Unknown time  . ferrous fumarate (HEMOCYTE - 106 MG FE) 325 (106 FE) MG  TABS tablet Take 1 tablet by mouth 2 (two) times daily.   03/05/2016 at Unknown time  . finasteride (PROSCAR) 5 MG tablet Take 5 mg by mouth daily.     03/05/2016 at Unknown time  . folic acid (FOLVITE) 1 MG tablet Take 1 mg by mouth daily.   03/05/2016 at Unknown time  . galantamine (RAZADYNE) 8 MG tablet Take 8 mg by mouth 2 (two) times daily.     03/05/2016 at Unknown time  . glyBURIDE (DIABETA) 5 MG tablet Take 5-10 mg by mouth 2 (two) times daily with a meal. 2 in the morning and 1 tab at night   03/05/2016  . guaiFENesin 200 MG tablet Take 400 mg by mouth 2 (two)  times daily.   03/05/2016 at Unknown time  . Insulin Glargine (LANTUS SOLOSTAR) 100 UNIT/ML Solostar Pen Inject 24 Units into the skin daily at 10 pm.    03/04/2016 at Unknown time  . levothyroxine (SYNTHROID, LEVOTHROID) 125 MCG tablet Take 250 mcg by mouth daily before breakfast.   03/05/2016 at Unknown time  . lisinopril (PRINIVIL,ZESTRIL) 20 MG tablet Take 20 mg by mouth daily.    03/05/2016 at Unknown time  . loratadine (CLARITIN) 10 MG tablet Take 10 mg by mouth daily.     03/05/2016 at Unknown time  . magnesium oxide (MAG-OX) 400 MG tablet Take 400 mg by mouth 2 (two) times daily.    03/05/2016 at Unknown time  . meclizine (ANTIVERT) 25 MG tablet Take 1 tablet by mouth daily as needed (inner ear).    unknown  . metFORMIN (GLUCOPHAGE) 1000 MG tablet Take 1,000 mg by mouth 2 (two) times daily with a meal.   03/05/2016 at Unknown time  . Multiple Vitamin (MULTIVITAMIN WITH MINERALS) TABS tablet Take 1 tablet by mouth daily.   03/05/2016 at Unknown time  . omeprazole (PRILOSEC) 20 MG capsule Take 20 mg by mouth 2 (two) times daily.     03/05/2016 at Unknown time  . oxybutynin (DITROPAN) 5 MG tablet Take 5 mg by mouth 2 (two) times daily.    03/05/2016 at Unknown time  . Pseudoeph-Doxylamine-DM-APAP (NYQUIL PO) Take 10 mLs by mouth at bedtime.   03/04/2016 at Unknown time  . rosuvastatin (CRESTOR) 40 MG tablet Take 20 mg by mouth daily.   03/04/2016  at Unknown time  . warfarin (COUMADIN) 5 MG tablet Take 1 tablet (5 mg total) by mouth at bedtime. Stop taking for 1 week (Patient taking differently: Take 2.5-5 mg by mouth at bedtime. 5 mg once daily except on Sunday take 2.5 mg.) 45 tablet 5 03/04/2016 at Unknown time  . ondansetron (ZOFRAN) 4 MG tablet Take 1 tablet by mouth every 4 (four) hours as needed for nausea or vomiting.    unknown  . predniSONE (DELTASONE) 10 MG tablet Take 4 tablets (40 mg total) by mouth daily with breakfast. Take 4 daily 3 days, 3 daily 3 days, 2 daily 3 days 1 daily 3 days (Patient not taking: Reported on 03/05/2016) 30 tablet 0 Not Taking at Unknown time  . sulfamethoxazole-trimethoprim (BACTRIM DS,SEPTRA DS) 800-160 MG tablet Take 1 tablet by mouth every 12 (twelve) hours. (Patient not taking: Reported on 03/05/2016) 10 tablet 0 Not Taking at Unknown time   Scheduled: . allopurinol  300 mg Oral Daily  . amLODipine  5 mg Oral Daily  . diltiazem  240 mg Oral Daily  . galantamine  8 mg Oral BID  . insulin aspart  0-9 Units Subcutaneous Q6H  . insulin glargine  24 Units Subcutaneous Q2200  . levothyroxine  250 mcg Oral QAC breakfast  . lisinopril  20 mg Oral Daily  . pantoprazole  40 mg Oral QAC breakfast  . rosuvastatin  20 mg Oral q1800  . sodium chloride flush  3 mL Intravenous Q12H   Continuous:  ZDG:LOVFIE chloride, acetaminophen **OR** acetaminophen, ondansetron **OR** ondansetron (ZOFRAN) IV, sodium chloride flush  Assesment: He was admitted with lower GI bleeding. He is hemodynamically stable now but I don't think there is any question that he did the right thing by coming to the emergency department here rather than trying to go to the [facility] considering that this could have been a life-threatening situation. I discussed that  with him. Right now I don't think he needs any procedures or a blood transfusion but he does need to have his hemoglobin level monitored because he is still  bleeding. I told him we could transfer him to the [place] if they would accept him but they may not accept him particularly since he may be able to go home in the next 24 hours if his bleeding stops. Agree we need to try to decide about whether he needs to stay on anticoagulation or not Principal Problem:   Lower GI bleed Active Problems:   Coronary atherosclerosis   Atrial fibrillation ([place])   Diabetes mellitus without complication ([place])   Acute GI bleeding    Plan: Continue monitoring here. Continue with serial hemoglobins.      Caleb [name] 03/06/2016, 9:05 AM

## 2016-03-06 NOTE — Telephone Encounter (Signed)
Al stated that [facility] paid for the majority of the bill, however the [place] denied the remaining balance $936.58.  He suggested that the patient give the [facility] a call to follow-up on the denial.  [phone]

## 2016-03-06 NOTE — Telephone Encounter (Signed)
-----   Message from Danie Binder, MD sent at 03/05/2016  5:37 PM EDT ----- Check on inpt bill pt concermed about [facility] payng for his stay in [place]

## 2016-03-06 NOTE — Care Management (Signed)
Pt seen on afternoon of 03/05/2016. Pt explaining he was service connected with [facility] and wished to be transferred so that his hospitalization would be paid for. Dr. Sarajane Jews, also present, explained to pt that he was observation and DC would be anticipated on 03/06/2016. Pt stated he was having rectal bleeding and would not be leaving on 03/06/2016. TC at the [facility] called and left VM, notifying them of pt's admission under observation and desire to be transferred. TC at the [facility] called 3 more times on 03/06/2016 and left VM's making them aware pt still wishes to be transferred. No return contact made from the [facility] at this time. Will cont to follow.

## 2016-03-06 NOTE — Telephone Encounter (Signed)
I spoke with the patient's wife and made her aware they need to follow-up with the [facility] in regards to payment.  She stated she will have him call when he is discharged from the hospital.

## 2016-03-06 NOTE — Telephone Encounter (Signed)
I spoke with Al with [facility]

## 2016-03-06 NOTE — Care Management Obs Status (Signed)
[facility] NOTIFICATION   Patient Details  Name: Donne Baley MRN: [id number] Date of Birth: Jun 08, 1935   Medicare Observation Status Notification Given:  Yes    Sherald Barge, RN 03/06/2016, 4:18 PM

## 2016-03-06 NOTE — Consult Note (Signed)
CARDIOLOGY CONSULT NOTE   Patient ID: Caleb [name] MRN: [id number] DOB/AGE: October 07, 1935 81 y.o.  Admit Date: 03/05/2016 Referring Physician: Sinda Du MD Primary Physician: Alonza Bogus, MD Consulting Cardiologist: Carlyle Dolly MD Primary Cardiologist: Carlyle Dolly MD Reason for Consultation: Recommendations for continuation of coumadin therapy with frequent GIB, with hx of PAF.   Clinical Summary Caleb [name] is a 81 y.o.male with known history of PAF, on coumadin with CHADS VASC Score of 5 (Age, HTN, CAD, DM), Hypertension, Diabetes who presented to ER with complaints of dark red blood from rectum. Previous history of GIB in December of 2016, GI evaluation finding no source of bleeding. Coumadin is on hold. We are asked for cardiology recommendations for continuation of anticoagulation.   On arrival to ER, BP 148/80, HR 96, afebrile. FOB positive. Hgb 14.3/Hct 44.8. PLTS 234. BUN 23. PT 27.9, INR 2.65. Was seen by GI on 03/05/2016, found to have diverticulosis, but no endoscopic evaluation was warranted currently.   Patient states that he began having bleeding yesterday am. Has abdominal pain in the bilateral lower quadrants.   Allergies  Allergen Reactions  . Levaquin [Levofloxacin In D5w]     SWELLING    Medications Scheduled Medications: . allopurinol  300 mg Oral Daily  . amLODipine  5 mg Oral Daily  . diltiazem  240 mg Oral Daily  . galantamine  8 mg Oral BID  . insulin aspart  0-9 Units Subcutaneous Q6H  . insulin glargine  24 Units Subcutaneous Q2200  . levothyroxine  250 mcg Oral QAC breakfast  . lisinopril  20 mg Oral Daily  . pantoprazole  40 mg Oral QAC breakfast  . rosuvastatin  20 mg Oral q1800  . sodium chloride flush  3 mL Intravenous Q12H      PRN Medications: sodium chloride, acetaminophen **OR** acetaminophen, ondansetron **OR** ondansetron (ZOFRAN) IV, sodium chloride flush   Past Medical History  Diagnosis Date  . Acute  exacerbation of chronic bronchitis ([place])   . History of tuberculosis   . CAD (coronary artery disease)   . Hyperlipidemia   . BPH (benign prostatic hypertrophy)   . Hyperthyroidism   . Diabetes mellitus without complication ([place])   . GERD (gastroesophageal reflux disease)   . Diverticulitis   . GI bleed   . Anemia   . Diverticulosis     Past Surgical History  Procedure Laterality Date  . Cholecystectomy    . Thyroidectomy    . Ercp  09/2005    Dr. Laural Golden: biliary leak, ERCP with stent placement  . Ercp  11/2005    Dr. Laural Golden: ERCP with removal of biliary stent and sphincterotomy   . Esophagogastroduodenoscopy N/A 11/23/2015    Dr. Gala Romney: normal esophagus, s/p dilation. duodenal diverticulum  . Colonoscopy N/A 11/24/2015    Dr. Gala Romney: pancolonic diverticulosis, polys removed and path consistent with adenomas  . Givens capsule study N/A 11/24/2015    unremarkable, however was incomplete. No obvious bleeding source  . Knee arthroscopy      Family History  Problem Relation Age of Onset  . Heart attack Father   . Heart failure Mother   . Colon cancer Neg Hx   . Stomach cancer Sister     deceased in her 55s from stomach cancer    Social History Caleb [name] reports that he quit smoking about 17 years ago. His smoking use included Cigarettes. He started smoking about 71 years ago. He has a 37.5 pack-year smoking history. He has never used  smokeless tobacco. Caleb [name] reports that he does not drink alcohol.  Review of Systems Complete review of systems are found to be negative unless outlined in H&P above.  Physical Examination Blood pressure 135/66, pulse 72, temperature 97.5 F (36.4 C), temperature source Oral, resp. rate 16, height '5\' 10"'$  (1.778 m), weight 190 lb (86.183 kg), SpO2 96 %.  Intake/Output Summary (Last 24 hours) at 03/06/16 0938 Last data filed at 03/06/16 0210  Gross per 24 hour  Intake    243 ml  Output      0 ml  Net    243 ml    Telemetry:  Patient not on telemetry   GEN: No acute distress. HEENT: Conjunctiva and lids normal, oropharynx clear with moist mucosa. Neck: Supple, no elevated JVP or carotid bruits, no thyromegaly. Lungs: Bilateral rales and rhonchi with coughing.  Cardiac: Regular rate and rhythm, 1/6 systolic murmur, no pericardial rub. Abdomen: Soft, tender in the bilateral lower quadrants, ventral hernia noted. , no hepatomegaly, bowel sounds present, no guarding or rebound. Extremities: No pitting edema, distal pulses 2+. Skin: Warm and dry. Musculoskeletal: No kyphosis. Neuropsychiatric: Alert and oriented x3, affect grossly appropriate.  Prior Cardiac Testing/Procedures 1. Echocardiogram 09/24/2014 Left ventricle: Wall thickness was increased in a pattern of mild LVH. There was moderate focal basal hypertrophy of the septum. Systolic function was vigorous. The estimated ejection fraction was in the range of 70% to 75%. Mild LVOT gradient 43 mmHg with Valsalva. Wall motion was normal; there were no regional wall motion abnormalities. Doppler parameters are consistent with abnormal left ventricular relaxation (grade 1 diastolic dysfunction). Doppler parameters are consistent with elevated ventricular end-diastolic filling pressure. - Aortic valve: Trileaflet; mildly calcified leaflets. There was no significant regurgitation. - Aortic root: The aortic root was mildly dilated. - Mitral valve: Calcified annulus. - Left atrium: The atrium was mildly dilated. - Right atrium: Central venous pressure (est): 3 mm Hg. - Atrial septum: No defect or patent foramen ovale was identified. - Pulmonary arteries: Systolic pressure could not be accurately estimated. - Pericardium, extracardiac: A prominent pericardial fat pad was present.  2. Cardiac Cath 05/01/2011 ASSESSMENT: 1. Left dominant circulation. 2. Obstructive disease in a nondominant right coronary artery with a 99% proximal       stenosis and a 70% mid stenosis.. 3. Preserved left ventricular systolic function. 4. Preserved right heart pressures.  Lab Results  Basic Metabolic Panel:  Recent Labs Lab 03/05/16 0811 03/06/16 0201  NA 141 137  K 4.5 4.2  CL 107 104  CO2 23 27  GLUCOSE 148* 146*  BUN 23* 23*  CREATININE 0.73 0.66  CALCIUM 8.8* 9.1    Liver Function Tests:  Recent Labs Lab 03/05/16 0811  AST 24  ALT 18  ALKPHOS 74  BILITOT 0.7  PROT 6.9  ALBUMIN 4.0    CBC:  Recent Labs Lab 03/05/16 0811 03/05/16 1433 03/05/16 1931 03/06/16 0201  WBC 7.6 7.1 6.7 6.9  NEUTROABS 4.8  --   --   --   HGB 14.3 12.9* 13.1 12.9*  HCT 44.8 39.9 40.0 39.7  MCV 92.0 91.9 91.7 91.1  PLT 234 199 214 188     Radiology: Patient did not have X-rays ordered.  No results found.   ECG: No recent EKG since 11/21/2015. Will order.    Impression and Recommendations  1. PAF: Patient has CHADS VASC Score of 5, calculated in high risk category. However, he continues to have evidence of GIB, with hx of diverticulosis.  H/H is stable. Would recommend stopping anticoagulation for now. Will discuss further with Dr. Harl Bowie.  Continue rate control. Will place him on telemetry to observe for atrial arrhythmia and heart rate control. Have EKG done.  He is currently on diltiazem 240 mg daily.   2. CAD:  Most recent cardiac cath 04/28/2011 demonstrated non-dominant RCA, with 99% proximal stenosis and a 70% mid     Stenosis.Marland KitchenHe is without complaints of chest pain. Has chronic dyspnea with COPD. EKG pending.   3. Hypertension: BP normal this am. Ranging from 92/52-135/66. He has not complaints of dizziness.   4. GIB: Second episode in December 2016, with GI evaluation at that time-colonoscopy and EDG finding possible gastric erosion and no obvious bleeding lesions noted. They do not plan invasive testing during this admission.    Signed: Phill Myron. Lawrence NP Ignacio  03/06/2016, 9:38 AM Co-Sign  MD  Attending Note Patient seen and disucssed with NP Lawerence, I agree with her documentation. 82 yo male history of PAF, HTN, HL, DM2, COPD, CAD admitted with low GI bleed while on coumadin. Prior admit 11/2015 with GI bleed as well with negative workup.   INR 2.65, K 4.5, Cr 0.73, Hgb 14.3, Plt 234 EKG pending FOBT +  Second episode of GI bleeding on coumadin in 3 months, will d/c coumadin at this time. Consideration for restarting anticoag can be done at outpatient cardiology follow up. I would stop his amlodopine given he is on dilt.   No further cardiac recs at this time, he may f/u in 3-4 weeks after discharge, can readdress possibility for restarting anticoag at that time.   Zandra Abts MD

## 2016-03-06 NOTE — Progress Notes (Signed)
    Subjective: Patient reports bright red blood per rectum but tapering down, now moreso consistent with "clots", less amount. Complains of rectum feeling raw but declining supportive measures. Worried about medical expenses.   Objective: Vital signs in last 24 hours: Temp:  [97.5 F (36.4 C)-97.7 F (36.5 C)] 97.5 F (36.4 C) (03/21 0428) Pulse Rate:  [72-88] 72 (03/21 0428) Resp:  [12-23] 16 (03/21 0428) BP: (92-135)/(49-74) 135/66 mmHg (03/21 0428) SpO2:  [95 %-96 %] 96 % (03/21 0428) Last BM Date: 03/05/16 General:   Alert and oriented, pleasant Head:  Normocephalic and atraumatic. Abdomen:  Bowel sounds present, distended but soft, ventral hernia noted Extremities:  Without edema. Neurologic:  Alert and  oriented x4 Psych:  Alert and cooperative. Normal mood and affect.  Intake/Output from previous day: 03/20 0701 - 03/21 0700 In: 243 [P.O.:240; I.V.:3] Out: -  Intake/Output this shift:    Lab Results:  Recent Labs  03/05/16 1433 03/05/16 1931 03/06/16 0201  WBC 7.1 6.7 6.9  HGB 12.9* 13.1 12.9*  HCT 39.9 40.0 39.7  PLT 199 214 188   BMET  Recent Labs  03/05/16 0811 03/06/16 0201  NA 141 137  K 4.5 4.2  CL 107 104  CO2 23 27  GLUCOSE 148* 146*  BUN 23* 23*  CREATININE 0.73 0.66  CALCIUM 8.8* 9.1   LFT  Recent Labs  03/05/16 0811  PROT 6.9  ALBUMIN 4.0  AST 24  ALT 18  ALKPHOS 74  BILITOT 0.7   PT/INR  Recent Labs  03/05/16 0811  LABPROT 27.9*  INR 2.65*    Assessment: 81 year old male presenting with recent GI bleed in Dec 2016 in setting of Coumadin, s/p colonoscopy, EGD, and capsule study, now presenting with recurrent GI bleed while on anticoagulation. Known diverticulosis on prior endoscopic exam, and capsule study was incomplete but overall unremarkable. Unclear etiology but with differentials to include diverticular origin, small bowel etiology, doubt rapid transit upper GI as previous EGD overall unimpressive. Risks and  benefits of anticoagulation will need to be discussed with cardiology, and consult is pending. Does not appear to be hemodynamically significant at this time and continuing to taper. Hemoglobin stable. Will advance diet.   Plan: Cardiology consultation for Continued anticoagulation risks and benefits  Protonix once daily before breakfast Advance diet Will continue to follow Supportive measures   Orvil Feil, ANP-BC [facility]       03/06/2016, 8:17 AM

## 2016-03-07 ENCOUNTER — Telehealth: Payer: Self-pay | Admitting: Gastroenterology

## 2016-03-07 ENCOUNTER — Encounter: Payer: Self-pay | Admitting: Internal Medicine

## 2016-03-07 DIAGNOSIS — K922 Gastrointestinal hemorrhage, unspecified: Secondary | ICD-10-CM | POA: Diagnosis not present

## 2016-03-07 LAB — GLUCOSE, CAPILLARY
GLUCOSE-CAPILLARY: 139 mg/dL — AB (ref 65–99)
Glucose-Capillary: 168 mg/dL — ABNORMAL HIGH (ref 65–99)

## 2016-03-07 LAB — CBC
HCT: 40.9 % (ref 39.0–52.0)
HEMOGLOBIN: 13.7 g/dL (ref 13.0–17.0)
MCH: 30 pg (ref 26.0–34.0)
MCHC: 33.5 g/dL (ref 30.0–36.0)
MCV: 89.7 fL (ref 78.0–100.0)
Platelets: 240 10*3/uL (ref 150–400)
RBC: 4.56 MIL/uL (ref 4.22–5.81)
RDW: 14.6 % (ref 11.5–15.5)
WBC: 9.4 10*3/uL (ref 4.0–10.5)

## 2016-03-07 MED ORDER — ASPIRIN EC 81 MG PO TBEC: 81.0000 mg | DELAYED_RELEASE_TABLET | Freq: Every day | ORAL | Status: DC

## 2016-03-07 MED ORDER — WARFARIN SODIUM 5 MG PO TABS: 5.0000 mg | ORAL_TABLET | Freq: Every day | ORAL | Status: DC

## 2016-03-07 NOTE — Progress Notes (Signed)
Subjective: He was admitted with GI bleeding. He says he is having some lower abdominal discomfort now. He has not had any requirement for blood transfusion. He has been taken off warfarin because of his GI bleeding. He is frustrated with his [employer].  Objective: Vital signs in last 24 hours: Temp:  [97.7 F (36.5 C)-97.9 F (36.6 C)] 97.9 F (36.6 C) (03/22 0500) Pulse Rate:  [75-86] 81 (03/22 0500) Resp:  [16-18] 18 (03/22 0500) BP: (110-145)/(61-80) 110/65 mmHg (03/22 0500) SpO2:  [95 %-98 %] 95 % (03/22 0500) Weight change:  Last BM Date: 03/06/16  Intake/Output from previous day: 03/21 0701 - 03/22 0700 In: 243 [P.O.:240; I.V.:3] Out: -   PHYSICAL EXAM General appearance: alert, cooperative and no distress Resp: clear to auscultation bilaterally Cardio: irregularly irregular rhythm GI: soft, non-tender; bowel sounds normal; no masses,  no organomegaly Extremities: extremities normal, atraumatic, no cyanosis or edema  Lab Results:  Results for orders placed or performed during the hospital encounter of 03/05/16 (from the past 48 hour(s))  CBC     Status: Abnormal   Collection Time: 03/05/16  2:33 PM  Result Value Ref Range   WBC 7.1 4.0 - 10.5 K/uL   RBC 4.34 4.22 - 5.81 MIL/uL   Hemoglobin 12.9 (L) 13.0 - 17.0 g/dL   HCT 39.9 39.0 - 52.0 %   MCV 91.9 78.0 - 100.0 fL   MCH 29.7 26.0 - 34.0 pg   MCHC 32.3 30.0 - 36.0 g/dL   RDW 14.4 11.5 - 15.5 %   Platelets 199 150 - 400 K/uL  MRSA PCR Screening     Status: None   Collection Time: 03/05/16  3:10 PM  Result Value Ref Range   MRSA by PCR NEGATIVE NEGATIVE    Comment:        The GeneXpert MRSA Assay (FDA approved for NASAL specimens only), is one component of a comprehensive MRSA colonization surveillance program. It is not intended to diagnose MRSA infection nor to guide or monitor treatment for MRSA infections.   Glucose, capillary     Status: Abnormal   Collection Time: 03/05/16   3:21 PM  Result Value Ref Range   Glucose-Capillary 133 (H) 65 - 99 mg/dL  CBC     Status: None   Collection Time: 03/05/16  7:31 PM  Result Value Ref Range   WBC 6.7 4.0 - 10.5 K/uL   RBC 4.36 4.22 - 5.81 MIL/uL   Hemoglobin 13.1 13.0 - 17.0 g/dL   HCT 40.0 39.0 - 52.0 %   MCV 91.7 78.0 - 100.0 fL   MCH 30.0 26.0 - 34.0 pg   MCHC 32.8 30.0 - 36.0 g/dL   RDW 14.5 11.5 - 15.5 %   Platelets 214 150 - 400 K/uL  Glucose, capillary     Status: Abnormal   Collection Time: 03/05/16  9:06 PM  Result Value Ref Range   Glucose-Capillary 188 (H) 65 - 99 mg/dL  Glucose, capillary     Status: Abnormal   Collection Time: 03/06/16 12:23 AM  Result Value Ref Range   Glucose-Capillary 111 (H) 65 - 99 mg/dL  CBC     Status: Abnormal   Collection Time: 03/06/16  2:01 AM  Result Value Ref Range   WBC 6.9 4.0 - 10.5 K/uL   RBC 4.36 4.22 - 5.81 MIL/uL   Hemoglobin 12.9 (L) 13.0 - 17.0 g/dL   HCT 39.7 39.0 - 52.0 %   MCV 91.1 78.0 - 100.0 fL  MCH 29.6 26.0 - 34.0 pg   MCHC 32.5 30.0 - 36.0 g/dL   RDW 14.3 11.5 - 15.5 %   Platelets 188 150 - 400 K/uL  Basic metabolic panel     Status: Abnormal   Collection Time: 03/06/16  2:01 AM  Result Value Ref Range   Sodium 137 135 - 145 mmol/L   Potassium 4.2 3.5 - 5.1 mmol/L   Chloride 104 101 - 111 mmol/L   CO2 27 22 - 32 mmol/L   Glucose, Bld 146 (H) 65 - 99 mg/dL   BUN 23 (H) 6 - 20 mg/dL   Creatinine, Ser 0.66 0.61 - 1.24 mg/dL   Calcium 9.1 8.9 - 10.3 mg/dL   GFR calc non Af Amer >60 >60 mL/min   GFR calc Af Amer >60 >60 mL/min    Comment: (NOTE) The eGFR has been calculated using the CKD EPI equation. This calculation has not been validated in all clinical situations. eGFR's persistently <60 mL/min signify possible Chronic Kidney Disease.    Anion gap 6 5 - 15  Glucose, capillary     Status: Abnormal   Collection Time: 03/06/16  4:26 AM  Result Value Ref Range   Glucose-Capillary 149 (H) 65 - 99 mg/dL  Glucose, capillary     Status:  Abnormal   Collection Time: 03/06/16  5:54 AM  Result Value Ref Range   Glucose-Capillary 191 (H) 65 - 99 mg/dL  Glucose, capillary     Status: Abnormal   Collection Time: 03/06/16  7:47 AM  Result Value Ref Range   Glucose-Capillary 153 (H) 65 - 99 mg/dL   Comment 1 Document in Chart   Glucose, capillary     Status: Abnormal   Collection Time: 03/06/16  1:06 PM  Result Value Ref Range   Glucose-Capillary 202 (H) 65 - 99 mg/dL   Comment 1 Document in Chart   Glucose, capillary     Status: Abnormal   Collection Time: 03/06/16  6:26 PM  Result Value Ref Range   Glucose-Capillary 202 (H) 65 - 99 mg/dL   Comment 1 Notify RN    Comment 2 Document in Chart   Glucose, capillary     Status: Abnormal   Collection Time: 03/06/16  9:14 PM  Result Value Ref Range   Glucose-Capillary 157 (H) 65 - 99 mg/dL   Comment 1 Notify RN    Comment 2 Document in Chart   Glucose, capillary     Status: Abnormal   Collection Time: 03/07/16  5:09 AM  Result Value Ref Range   Glucose-Capillary 168 (H) 65 - 99 mg/dL  CBC     Status: None   Collection Time: 03/07/16  8:06 AM  Result Value Ref Range   WBC 9.4 4.0 - 10.5 K/uL   RBC 4.56 4.22 - 5.81 MIL/uL   Hemoglobin 13.7 13.0 - 17.0 g/dL   HCT 40.9 39.0 - 52.0 %   MCV 89.7 78.0 - 100.0 fL   MCH 30.0 26.0 - 34.0 pg   MCHC 33.5 30.0 - 36.0 g/dL   RDW 14.6 11.5 - 15.5 %   Platelets 240 150 - 400 K/uL  Glucose, capillary     Status: Abnormal   Collection Time: 03/07/16  8:16 AM  Result Value Ref Range   Glucose-Capillary 139 (H) 65 - 99 mg/dL   Comment 1 Notify RN     ABGS No results for input(s): PHART, PO2ART, TCO2, HCO3 in the last 72 hours.  Invalid input(s): PCO2 CULTURES  Recent Results (from the past 240 hour(s))  MRSA PCR Screening     Status: None   Collection Time: 03/05/16  3:10 PM  Result Value Ref Range Status   MRSA by PCR NEGATIVE NEGATIVE Final    Comment:        The GeneXpert MRSA Assay (FDA approved for NASAL  specimens only), is one component of a comprehensive MRSA colonization surveillance program. It is not intended to diagnose MRSA infection nor to guide or monitor treatment for MRSA infections.    Studies/Results: No results found.  Medications:  Prior to Admission:  Prescriptions prior to admission  Medication Sig Dispense Refill Last Dose  . allopurinol (ZYLOPRIM) 300 MG tablet Take 300 mg by mouth daily.   03/05/2016 at Unknown time  . amLODipine (NORVASC) 5 MG tablet Take 1 tablet (5 mg total) by mouth daily. 180 tablet 3 03/05/2016 at Unknown time  . Ascorbic Acid (VITAMIN C) 500 MG tablet Take 500 mg by mouth 2 (two) times daily.    03/05/2016 at Unknown time  . aspirin EC 81 MG tablet Take 81 mg by mouth daily.   03/04/2016 at 1830  . cholecalciferol (VITAMIN D) 1000 UNITS tablet Take 4,000 Units by mouth daily.   03/05/2016 at Unknown time  . clobetasol cream (TEMOVATE) 8.25 % Apply 1 application topically 2 (two) times daily as needed (Dry Skin).    unknown  . diltiazem (DILACOR XR) 240 MG 24 hr capsule Take 240 mg by mouth daily.     03/05/2016 at 0430  . docusate sodium (COLACE) 100 MG capsule Take 300 mg by mouth 2 (two) times daily.   03/05/2016 at Unknown time  . ferrous fumarate (HEMOCYTE - 106 MG FE) 325 (106 FE) MG TABS tablet Take 1 tablet by mouth 2 (two) times daily.   03/05/2016 at Unknown time  . finasteride (PROSCAR) 5 MG tablet Take 5 mg by mouth daily.     03/05/2016 at Unknown time  . folic acid (FOLVITE) 1 MG tablet Take 1 mg by mouth daily.   03/05/2016 at Unknown time  . galantamine (RAZADYNE) 8 MG tablet Take 8 mg by mouth 2 (two) times daily.     03/05/2016 at Unknown time  . glyBURIDE (DIABETA) 5 MG tablet Take 5-10 mg by mouth 2 (two) times daily with a meal. 2 in the morning and 1 tab at night   03/05/2016  . guaiFENesin 200 MG tablet Take 400 mg by mouth 2 (two) times daily.   03/05/2016 at Unknown time  . Insulin Glargine (LANTUS SOLOSTAR) 100 UNIT/ML Solostar  Pen Inject 24 Units into the skin daily at 10 pm.    03/04/2016 at Unknown time  . levothyroxine (SYNTHROID, LEVOTHROID) 125 MCG tablet Take 250 mcg by mouth daily before breakfast.   03/05/2016 at Unknown time  . lisinopril (PRINIVIL,ZESTRIL) 20 MG tablet Take 20 mg by mouth daily.    03/05/2016 at Unknown time  . loratadine (CLARITIN) 10 MG tablet Take 10 mg by mouth daily.     03/05/2016 at Unknown time  . magnesium oxide (MAG-OX) 400 MG tablet Take 400 mg by mouth 2 (two) times daily.    03/05/2016 at Unknown time  . meclizine (ANTIVERT) 25 MG tablet Take 1 tablet by mouth daily as needed (inner ear).    unknown  . metFORMIN (GLUCOPHAGE) 1000 MG tablet Take 1,000 mg by mouth 2 (two) times daily with a meal.   03/05/2016 at Unknown time  . Multiple Vitamin (MULTIVITAMIN  WITH MINERALS) TABS tablet Take 1 tablet by mouth daily.   03/05/2016 at Unknown time  . omeprazole (PRILOSEC) 20 MG capsule Take 20 mg by mouth 2 (two) times daily.     03/05/2016 at Unknown time  . oxybutynin (DITROPAN) 5 MG tablet Take 5 mg by mouth 2 (two) times daily.    03/05/2016 at Unknown time  . Pseudoeph-Doxylamine-DM-APAP (NYQUIL PO) Take 10 mLs by mouth at bedtime.   03/04/2016 at Unknown time  . rosuvastatin (CRESTOR) 40 MG tablet Take 20 mg by mouth daily.   03/04/2016 at Unknown time  . warfarin (COUMADIN) 5 MG tablet Take 1 tablet (5 mg total) by mouth at bedtime. Stop taking for 1 week (Patient taking differently: Take 2.5-5 mg by mouth at bedtime. 5 mg once daily except on Sunday take 2.5 mg.) 45 tablet 5 03/04/2016 at Unknown time  . ondansetron (ZOFRAN) 4 MG tablet Take 1 tablet by mouth every 4 (four) hours as needed for nausea or vomiting.    unknown  . predniSONE (DELTASONE) 10 MG tablet Take 4 tablets (40 mg total) by mouth daily with breakfast. Take 4 daily 3 days, 3 daily 3 days, 2 daily 3 days 1 daily 3 days (Patient not taking: Reported on 03/05/2016) 30 tablet 0 Not Taking at Unknown time  .  sulfamethoxazole-trimethoprim (BACTRIM DS,SEPTRA DS) 800-160 MG tablet Take 1 tablet by mouth every 12 (twelve) hours. (Patient not taking: Reported on 03/05/2016) 10 tablet 0 Not Taking at Unknown time   Scheduled: . allopurinol  300 mg Oral Daily  . amLODipine  5 mg Oral Daily  . diltiazem  240 mg Oral Daily  . galantamine  8 mg Oral BID  . insulin aspart  0-9 Units Subcutaneous Q6H  . insulin glargine  24 Units Subcutaneous Q2200  . levothyroxine  250 mcg Oral QAC breakfast  . lisinopril  20 mg Oral Daily  . pantoprazole  40 mg Oral QAC breakfast  . rosuvastatin  20 mg Oral q1800  . sodium chloride flush  3 mL Intravenous Q12H   Continuous:  MWN:UUVOZD chloride, acetaminophen **OR** acetaminophen, ondansetron **OR** ondansetron (ZOFRAN) IV, sodium chloride flush  Assesment: He had lower GI bleeding. His hemoglobin level has actually gone up today. He has chronic atrial fibrillation and has been on anticoagulation which has stopped for now. He is stable for discharge. Principal Problem:   Lower GI bleed Active Problems:   Essential hypertension, benign   Coronary atherosclerosis   Atrial fibrillation (HCC)   Long term (current) use of anticoagulants   COPD (chronic obstructive pulmonary disease) (HCC)   Diabetes mellitus without complication ([place])   Acute GI bleeding    Plan: Discharge home today      Jazmarie Biever L 03/07/2016, 8:44 AM

## 2016-03-07 NOTE — Progress Notes (Signed)
Patient with orders to be discharge home. Discharge instructions given, patient verbalized understanding. Patient stable. Patient left in private vehicle with family.  

## 2016-03-07 NOTE — Telephone Encounter (Signed)
Patient needs hospital follow up in four months with [facility] or [facility].

## 2016-03-07 NOTE — Care Management Note (Signed)
Case Management Note  Patient Details  Name: Caleb [name] MRN: [id number] Date of Birth: 14-Mar-1935  Subjective/Objective:                  Pt admitted with GIB. Pt is from home and ind with ADL's.   Action/Plan: Pt discharging home today with self care. [facility] returned VM on 03/06/2016, H&P faxed, stated they were not interested in transfer as pt was to be discharged <24 hrs. [facility]. No further CM needs.   Expected Discharge Date:    03/07/2016              Expected Discharge Plan:  Home/Self Care  In-House Referral:  NA  Discharge planning Services  CM Consult  Post Acute Care Choice:  NA Choice offered to:  NA  DME Arranged:    DME Agency:     HH Arranged:    HH Agency:     Status of Service:  Completed, signed off  Medicare Important Message Given:    Date Medicare IM Given:    Medicare IM give by:    Date Additional Medicare IM Given:    Additional Medicare Important Message give by:     If discussed at [place] of Stay Meetings, dates discussed:    Additional Comments:  Sherald Barge, RN 03/07/2016, 10:22 AM

## 2016-03-07 NOTE — Discharge Summary (Signed)
Physician Discharge Summary  Patient ID: Caleb [name] MRN: [id number] DOB/AGE: 1935/10/04 81 y.o. Primary Care Physician:Tell Rozelle L, MD Admit date: 03/05/2016 Discharge date: 03/07/2016    Discharge Diagnoses:   Principal Problem:   Lower GI bleed Active Problems:   Essential hypertension, benign   Coronary atherosclerosis   Atrial fibrillation ([place])   Long term (current) use of anticoagulants   COPD (chronic obstructive pulmonary disease) (HCC)   Diabetes mellitus without complication (HCC)   Acute GI bleeding     Medication List    TAKE these medications        allopurinol 300 MG tablet  Commonly known as:  ZYLOPRIM  Take 300 mg by mouth daily.     amLODipine 5 MG tablet  Commonly known as:  NORVASC  Take 1 tablet (5 mg total) by mouth daily.     aspirin EC 81 MG tablet  Take 1 tablet (81 mg total) by mouth daily. Stop taking for 2 weeks and then resume     cholecalciferol 1000 units tablet  Commonly known as:  VITAMIN D  Take 4,000 Units by mouth daily.     clobetasol cream 0.05 %  Commonly known as:  TEMOVATE  Apply 1 application topically 2 (two) times daily as needed (Dry Skin).     diltiazem 240 MG 24 hr capsule  Commonly known as:  DILACOR XR  Take 240 mg by mouth daily.     docusate sodium 100 MG capsule  Commonly known as:  COLACE  Take 300 mg by mouth 2 (two) times daily.     ferrous fumarate 325 (106 Fe) MG Tabs tablet  Commonly known as:  HEMOCYTE - 106 mg FE  Take 1 tablet by mouth 2 (two) times daily.     finasteride 5 MG tablet  Commonly known as:  PROSCAR  Take 5 mg by mouth daily.     folic acid 1 MG tablet  Commonly known as:  FOLVITE  Take 1 mg by mouth daily.     galantamine 8 MG tablet  Commonly known as:  RAZADYNE  Take 8 mg by mouth 2 (two) times daily.     glyBURIDE 5 MG tablet  Commonly known as:  DIABETA  Take 5-10 mg by mouth 2 (two) times daily with a meal. 2 in the morning and 1 tab at night     guaiFENesin  200 MG tablet  Take 400 mg by mouth 2 (two) times daily.     LANTUS SOLOSTAR 100 UNIT/ML Solostar Pen  Generic drug:  Insulin Glargine  Inject 24 Units into the skin daily at 10 pm.     levothyroxine 125 MCG tablet  Commonly known as:  SYNTHROID, LEVOTHROID  Take 250 mcg by mouth daily before breakfast.     lisinopril 20 MG tablet  Commonly known as:  PRINIVIL,ZESTRIL  Take 20 mg by mouth daily.     loratadine 10 MG tablet  Commonly known as:  CLARITIN  Take 10 mg by mouth daily.     magnesium oxide 400 MG tablet  Commonly known as:  MAG-OX  Take 400 mg by mouth 2 (two) times daily.     meclizine 25 MG tablet  Commonly known as:  ANTIVERT  Take 1 tablet by mouth daily as needed (inner ear).     metFORMIN 1000 MG tablet  Commonly known as:  GLUCOPHAGE  Take 1,000 mg by mouth 2 (two) times daily with a meal.     multivitamin with minerals Tabs  tablet  Take 1 tablet by mouth daily.     NYQUIL PO  Take 10 mLs by mouth at bedtime.     omeprazole 20 MG capsule  Commonly known as:  PRILOSEC  Take 20 mg by mouth 2 (two) times daily.     ondansetron 4 MG tablet  Commonly known as:  ZOFRAN  Take 1 tablet by mouth every 4 (four) hours as needed for nausea or vomiting.     oxybutynin 5 MG tablet  Commonly known as:  DITROPAN  Take 5 mg by mouth 2 (two) times daily.     predniSONE 10 MG tablet  Commonly known as:  DELTASONE  Take 4 tablets (40 mg total) by mouth daily with breakfast. Take 4 daily 3 days, 3 daily 3 days, 2 daily 3 days 1 daily 3 days     rosuvastatin 40 MG tablet  Commonly known as:  CRESTOR  Take 20 mg by mouth daily.     sulfamethoxazole-trimethoprim 800-160 MG tablet  Commonly known as:  BACTRIM DS,SEPTRA DS  Take 1 tablet by mouth every 12 (twelve) hours.     vitamin C 500 MG tablet  Commonly known as:  ASCORBIC ACID  Take 500 mg by mouth 2 (two) times daily.     warfarin 5 MG tablet  Commonly known as:  COUMADIN  Take 1 tablet (5 mg total) by  mouth at bedtime. Stop taking for 2 weeks. Discuss with your physician at the [facility] and resume if they order it        Discharged Condition: Improved    Consults: GI/cardiology  Significant Diagnostic Studies: No results found.  Lab Results: Basic Metabolic Panel:  Recent Labs  03/05/16 0811 03/06/16 0201  NA 141 137  K 4.5 4.2  CL 107 104  CO2 23 27  GLUCOSE 148* 146*  BUN 23* 23*  CREATININE 0.73 0.66  CALCIUM 8.8* 9.1   Liver Function Tests:  Recent Labs  03/05/16 0811  AST 24  ALT 18  ALKPHOS 74  BILITOT 0.7  PROT 6.9  ALBUMIN 4.0     CBC:  Recent Labs  03/05/16 0811  03/06/16 0201 03/07/16 0806  WBC 7.6  < > 6.9 9.4  NEUTROABS 4.8  --   --   --   HGB 14.3  < > 12.9* 13.7  HCT 44.8  < > 39.7 40.9  MCV 92.0  < > 91.1 89.7  PLT 234  < > 188 240  < > = values in this interval not displayed.  Recent Results (from the past 240 hour(s))  MRSA PCR Screening     Status: None   Collection Time: 03/05/16  3:10 PM  Result Value Ref Range Status   MRSA by PCR NEGATIVE NEGATIVE Final    Comment:        The GeneXpert MRSA Assay (FDA approved for NASAL specimens only), is one component of a comprehensive MRSA colonization surveillance program. It is not intended to diagnose MRSA infection nor to guide or monitor treatment for MRSA infections.      Hospital Course: This is a 81 year old who was in his usual state of poor health at home when he developed bloody stools. He came to the emergency department because he was concerned that he may have massive GI bleeding particularly since he is anticoagulated. His hemoglobin level was okay. This remained stable. He had GI consultation and it was felt that he did have GI bleeding but it that it was not  severe enough to need intervention right now. He's going to be off his blood thinners. His hemoglobin level actually went up by the time of discharge. His bleeding has stopped.  Discharge Exam: Blood pressure  110/65, pulse 81, temperature 97.9 F (36.6 C), temperature source Oral, resp. rate 18, height '5\' 10"'$  (1.778 m), weight 86.183 kg (190 lb), SpO2 95 %. He is awake and alert. His chest is clear. He is in chronic atrial fib.  Disposition: Home he will have another CBC in the morning as an outpatient. He will be off aspirin and warfarin for 2 weeks. He is going to discuss long-term anticoagulation with his treating physician at the [place]      Signed: Darbie Biancardi L   03/07/2016, 8:57 AM

## 2016-03-07 NOTE — Telephone Encounter (Signed)
APPT MADE WITH ANNA, HE IS RMR PATIENT

## 2016-03-08 DIAGNOSIS — K625 Hemorrhage of anus and rectum: Secondary | ICD-10-CM | POA: Diagnosis not present

## 2016-03-08 DIAGNOSIS — K922 Gastrointestinal hemorrhage, unspecified: Secondary | ICD-10-CM | POA: Diagnosis not present

## 2016-03-08 DIAGNOSIS — I10 Essential (primary) hypertension: Secondary | ICD-10-CM | POA: Diagnosis not present

## 2016-03-08 DIAGNOSIS — I25119 Atherosclerotic heart disease of native coronary artery with unspecified angina pectoris: Secondary | ICD-10-CM | POA: Diagnosis not present

## 2016-03-08 DIAGNOSIS — E1165 Type 2 diabetes mellitus with hyperglycemia: Secondary | ICD-10-CM | POA: Diagnosis not present

## 2016-05-09 NOTE — Telephone Encounter (Signed)
He is actually a Caleb [name] patient. She was on call when I did the consult, and Dr. Gala Romney did the procedures the next day. Care was established with Dr. Oneida Alar during first hospitalization.   Received a note from the [place] stating "patient will need CT angiography if he has LGI bleeding". Will scan this into epic. Patient will be evaluated clinically if persistent lower GI bleeding.

## 2016-06-13 DIAGNOSIS — I25119 Atherosclerotic heart disease of native coronary artery with unspecified angina pectoris: Secondary | ICD-10-CM | POA: Diagnosis not present

## 2016-06-13 DIAGNOSIS — I10 Essential (primary) hypertension: Secondary | ICD-10-CM | POA: Diagnosis not present

## 2016-06-13 DIAGNOSIS — J449 Chronic obstructive pulmonary disease, unspecified: Secondary | ICD-10-CM | POA: Diagnosis not present

## 2016-06-13 DIAGNOSIS — I4891 Unspecified atrial fibrillation: Secondary | ICD-10-CM | POA: Diagnosis not present

## 2016-07-10 ENCOUNTER — Ambulatory Visit: Payer: Non-veteran care | Admitting: Gastroenterology

## 2016-09-03 DIAGNOSIS — Z23 Encounter for immunization: Secondary | ICD-10-CM | POA: Diagnosis not present

## 2016-09-03 DIAGNOSIS — R112 Nausea with vomiting, unspecified: Secondary | ICD-10-CM | POA: Diagnosis not present

## 2016-09-03 DIAGNOSIS — N390 Urinary tract infection, site not specified: Secondary | ICD-10-CM | POA: Diagnosis not present

## 2016-09-20 IMAGING — DX DG CHEST 2V
3 series · 3 of 3 positions shown · non-contrast
Comparison: CT 06/27/2014.  Radiographs 06/27/2014.

CLINICAL DATA: Midsternal chest pain radiating into the left arm.
History of diabetes and coronary artery disease.

EXAM:
CHEST  2 VIEW

[chest lat (1 of 2)]
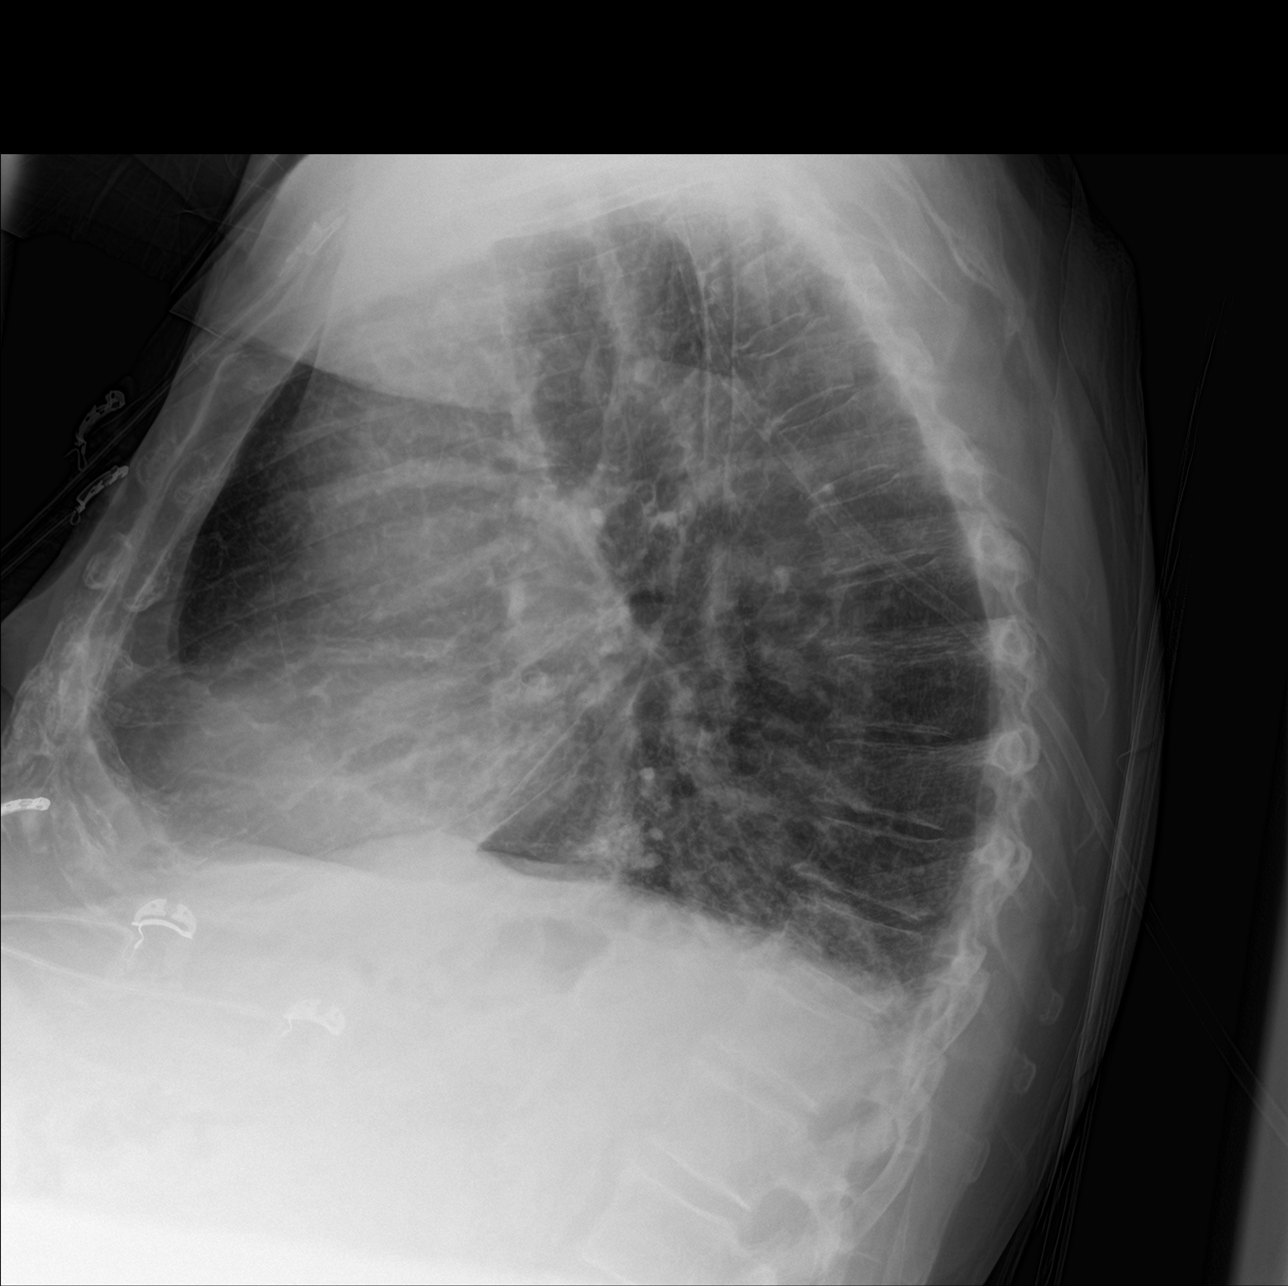

[chest ap]
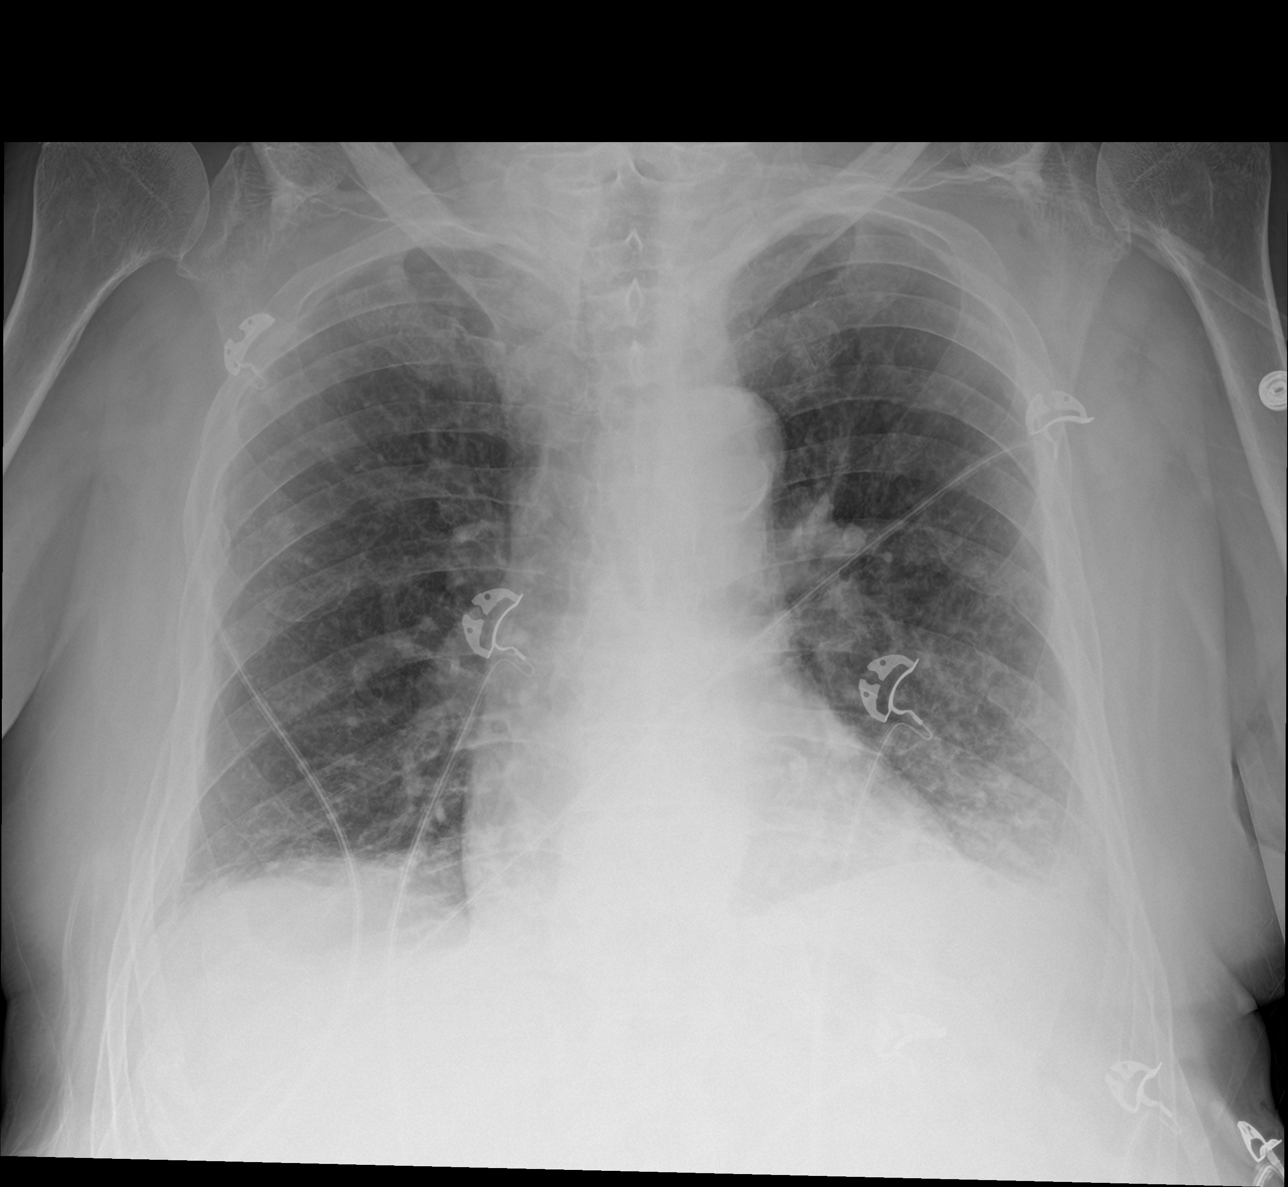

[chest lat (2 of 2)]
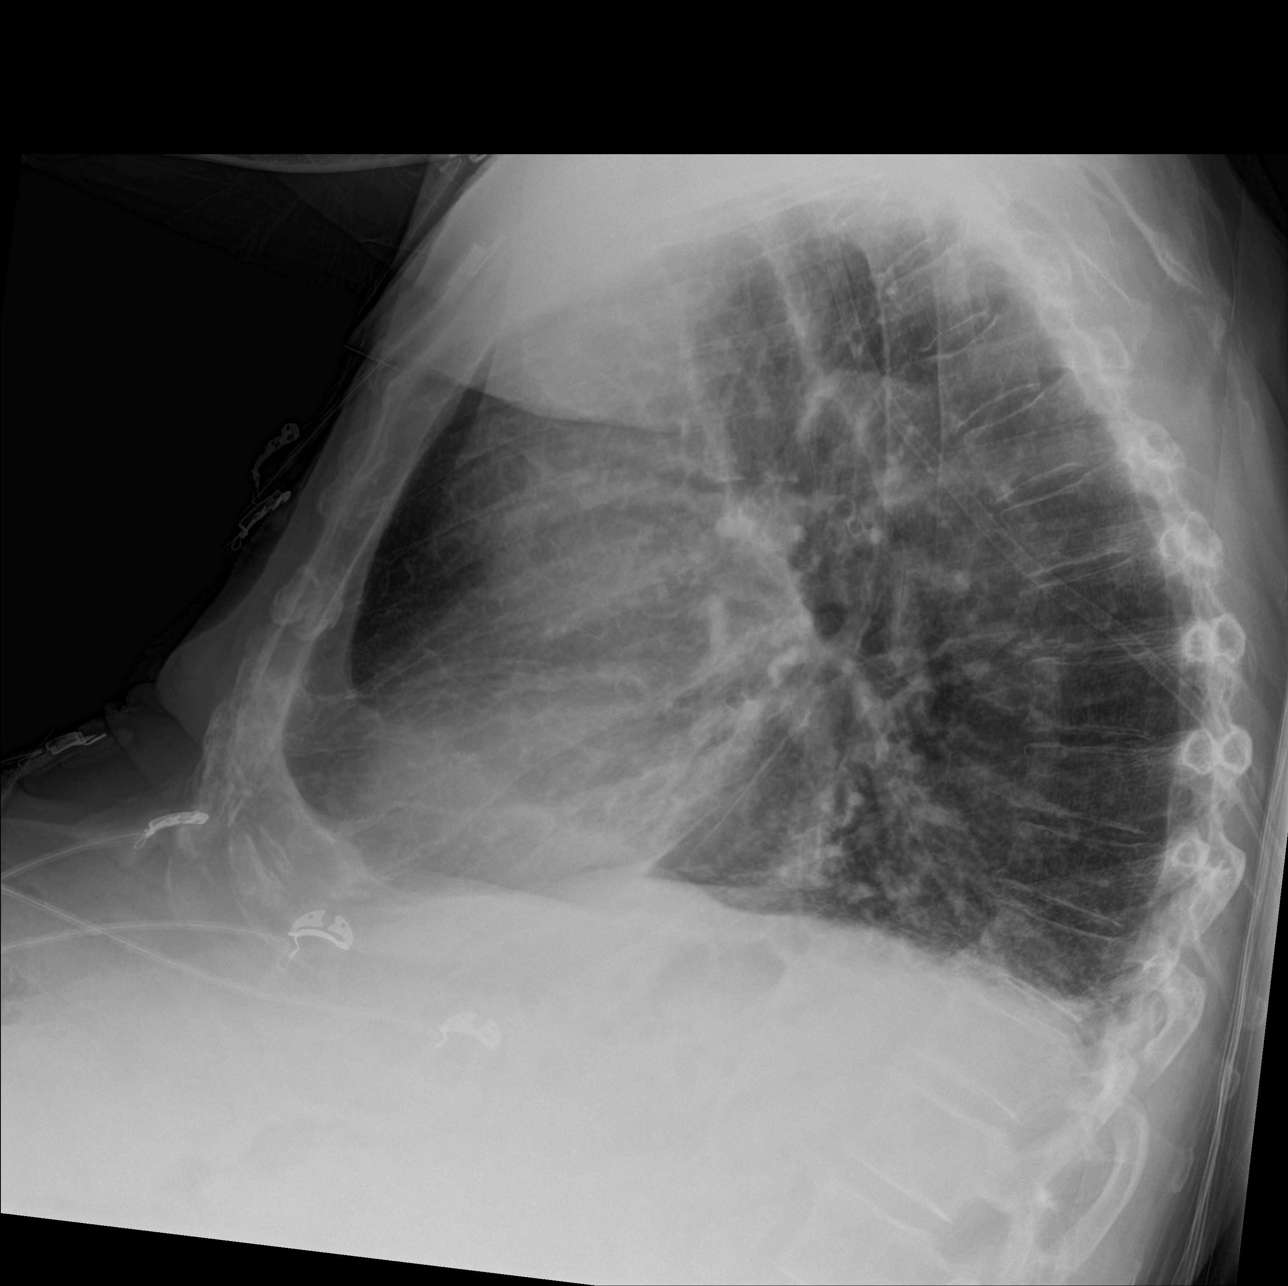

[3 of 3 positions shown; findings below may reference images not displayed]

FINDINGS: The heart size and mediastinal contours are stable with aortic
atherosclerosis. There is chronic lung disease with central airway
thickening and peribronchovascular nodularity. There are increased
patchy opacities at both lung bases compared with the most recent
studies, but no consolidation or significant pleural effusion. The
bones appear unchanged.
IMPRESSION: Mildly progressive patchy airspace opacities at both lung bases
compared with recent prior studies. These findings are nonspecific
and may reflect atelectasis or chronic inflammation. Other sequela
of chronic granulomatous disease appear unchanged.

## 2016-10-08 ENCOUNTER — Inpatient Hospital Stay (HOSPITAL_COMMUNITY)
Admission: EM | Admit: 2016-10-08 | Discharge: 2016-10-10 | DRG: 390 | Disposition: A | Discharge status: Home / Self Care | Payer: Medicare Other | Attending: Pulmonary Disease | Admitting: Pulmonary Disease

## 2016-10-08 ENCOUNTER — Encounter (HOSPITAL_COMMUNITY): Payer: Self-pay | Admitting: Emergency Medicine

## 2016-10-08 ENCOUNTER — Emergency Department (HOSPITAL_COMMUNITY): Payer: Medicare Other

## 2016-10-08 DIAGNOSIS — G43A0 Cyclical vomiting, not intractable: Secondary | ICD-10-CM | POA: Diagnosis not present

## 2016-10-08 DIAGNOSIS — Z881 Allergy status to other antibiotic agents status: Secondary | ICD-10-CM

## 2016-10-08 DIAGNOSIS — I2510 Atherosclerotic heart disease of native coronary artery without angina pectoris: Secondary | ICD-10-CM | POA: Diagnosis present

## 2016-10-08 DIAGNOSIS — I482 Chronic atrial fibrillation: Secondary | ICD-10-CM | POA: Diagnosis present

## 2016-10-08 DIAGNOSIS — R062 Wheezing: Secondary | ICD-10-CM

## 2016-10-08 DIAGNOSIS — Z794 Long term (current) use of insulin: Secondary | ICD-10-CM

## 2016-10-08 DIAGNOSIS — Z87891 Personal history of nicotine dependence: Secondary | ICD-10-CM

## 2016-10-08 DIAGNOSIS — I10 Essential (primary) hypertension: Secondary | ICD-10-CM | POA: Diagnosis present

## 2016-10-08 DIAGNOSIS — K567 Ileus, unspecified: Secondary | ICD-10-CM | POA: Diagnosis not present

## 2016-10-08 DIAGNOSIS — I4891 Unspecified atrial fibrillation: Secondary | ICD-10-CM | POA: Diagnosis present

## 2016-10-08 DIAGNOSIS — N400 Enlarged prostate without lower urinary tract symptoms: Secondary | ICD-10-CM | POA: Diagnosis present

## 2016-10-08 DIAGNOSIS — E119 Type 2 diabetes mellitus without complications: Secondary | ICD-10-CM | POA: Diagnosis present

## 2016-10-08 DIAGNOSIS — Z7901 Long term (current) use of anticoagulants: Secondary | ICD-10-CM | POA: Diagnosis not present

## 2016-10-08 DIAGNOSIS — Z79899 Other long term (current) drug therapy: Secondary | ICD-10-CM

## 2016-10-08 DIAGNOSIS — Z8611 Personal history of tuberculosis: Secondary | ICD-10-CM | POA: Diagnosis not present

## 2016-10-08 DIAGNOSIS — R079 Chest pain, unspecified: Secondary | ICD-10-CM

## 2016-10-08 DIAGNOSIS — Z8249 Family history of ischemic heart disease and other diseases of the circulatory system: Secondary | ICD-10-CM | POA: Diagnosis not present

## 2016-10-08 DIAGNOSIS — R112 Nausea with vomiting, unspecified: Secondary | ICD-10-CM | POA: Diagnosis present

## 2016-10-08 DIAGNOSIS — K219 Gastro-esophageal reflux disease without esophagitis: Secondary | ICD-10-CM | POA: Diagnosis present

## 2016-10-08 DIAGNOSIS — E890 Postprocedural hypothyroidism: Secondary | ICD-10-CM | POA: Diagnosis present

## 2016-10-08 DIAGNOSIS — J449 Chronic obstructive pulmonary disease, unspecified: Secondary | ICD-10-CM | POA: Diagnosis present

## 2016-10-08 DIAGNOSIS — E782 Mixed hyperlipidemia: Secondary | ICD-10-CM | POA: Diagnosis present

## 2016-10-08 DIAGNOSIS — Z7982 Long term (current) use of aspirin: Secondary | ICD-10-CM

## 2016-10-08 LAB — URINALYSIS, ROUTINE W REFLEX MICROSCOPIC
Bilirubin Urine: NEGATIVE
GLUCOSE, UA: NEGATIVE mg/dL
HGB URINE DIPSTICK: NEGATIVE
Ketones, ur: NEGATIVE mg/dL
LEUKOCYTES UA: NEGATIVE
Nitrite: NEGATIVE
Protein, ur: NEGATIVE mg/dL
SPECIFIC GRAVITY, URINE: 1.025 (ref 1.005–1.030)
pH: 6 (ref 5.0–8.0)

## 2016-10-08 LAB — CBC
HCT: 41.5 % (ref 39.0–52.0)
HEMOGLOBIN: 13.7 g/dL (ref 13.0–17.0)
MCH: 31.7 pg (ref 26.0–34.0)
MCHC: 33 g/dL (ref 30.0–36.0)
MCV: 96.1 fL (ref 78.0–100.0)
PLATELETS: 183 10*3/uL (ref 150–400)
RBC: 4.32 MIL/uL (ref 4.22–5.81)
RDW: 15.1 % (ref 11.5–15.5)
WBC: 10 10*3/uL (ref 4.0–10.5)

## 2016-10-08 LAB — COMPREHENSIVE METABOLIC PANEL
ALBUMIN: 3.8 g/dL (ref 3.5–5.0)
ALT: 20 U/L (ref 17–63)
ANION GAP: 8 (ref 5–15)
AST: 20 U/L (ref 15–41)
Alkaline Phosphatase: 71 U/L (ref 38–126)
BUN: 24 mg/dL — AB (ref 6–20)
CHLORIDE: 97 mmol/L — AB (ref 101–111)
CO2: 28 mmol/L (ref 22–32)
Calcium: 8.6 mg/dL — ABNORMAL LOW (ref 8.9–10.3)
Creatinine, Ser: 0.69 mg/dL (ref 0.61–1.24)
GFR calc Af Amer: >60 mL/min (ref >60)
GFR calc non Af Amer: >60 mL/min (ref >60)
GLUCOSE: 132 mg/dL — AB (ref 65–99)
POTASSIUM: 3.6 mmol/L (ref 3.5–5.1)
Sodium: 133 mmol/L — ABNORMAL LOW (ref 135–145)
Total Bilirubin: 1 mg/dL (ref 0.3–1.2)
Total Protein: 6.7 g/dL (ref 6.5–8.1)

## 2016-10-08 LAB — PROTIME-INR
INR: 1.01
PROTHROMBIN TIME: 13.3 s (ref 11.4–15.2)

## 2016-10-08 LAB — TROPONIN I

## 2016-10-08 LAB — LACTIC ACID, PLASMA: Lactic Acid, Venous: 1.1 mmol/L (ref 0.5–1.9)

## 2016-10-08 LAB — LIPASE, BLOOD: LIPASE: 17 U/L (ref 11–51)

## 2016-10-08 MED ORDER — SODIUM CHLORIDE 0.9 % IV SOLN: INTRAVENOUS | Status: AC

## 2016-10-08 MED ORDER — INSULIN ASPART 100 UNIT/ML ~~LOC~~ SOLN
0.0000 [IU] | Freq: Three times a day (TID) | SUBCUTANEOUS | Status: DC
  Administered 2016-10-09 (×3): 2 [IU] via SUBCUTANEOUS
  Administered 2016-10-10: 1 [IU] via SUBCUTANEOUS

## 2016-10-08 MED ORDER — IPRATROPIUM-ALBUTEROL 0.5-2.5 (3) MG/3ML IN SOLN
3.0000 mL | Freq: Once | RESPIRATORY_TRACT | Status: AC
  Administered 2016-10-08: 3 mL via RESPIRATORY_TRACT
  Filled 2016-10-08: qty 3

## 2016-10-08 MED ORDER — LEVOTHYROXINE SODIUM 100 MCG IV SOLR
125.0000 ug | Freq: Every day | INTRAVENOUS | Status: DC
  Filled 2016-10-08 (×2): qty 10

## 2016-10-08 MED ORDER — ASPIRIN 81 MG PO CHEW
324.0000 mg | CHEWABLE_TABLET | Freq: Once | ORAL | Status: AC
  Administered 2016-10-08: 324 mg via ORAL
  Filled 2016-10-08: qty 4

## 2016-10-08 MED ORDER — ALBUTEROL SULFATE (2.5 MG/3ML) 0.083% IN NEBU
2.5000 mg | INHALATION_SOLUTION | Freq: Once | RESPIRATORY_TRACT | Status: AC
  Administered 2016-10-08: 2.5 mg via RESPIRATORY_TRACT
  Filled 2016-10-08: qty 3

## 2016-10-08 MED ORDER — METOPROLOL TARTRATE 5 MG/5ML IV SOLN: 5.0000 mg | Freq: Four times a day (QID) | INTRAVENOUS | Status: DC | PRN

## 2016-10-08 MED ORDER — ACETAMINOPHEN 650 MG RE SUPP: 650.0000 mg | Freq: Four times a day (QID) | RECTAL | Status: DC | PRN

## 2016-10-08 MED ORDER — SODIUM CHLORIDE 0.9 % IV SOLN
INTRAVENOUS | Status: DC
  Administered 2016-10-08 – 2016-10-10 (×4): via INTRAVENOUS

## 2016-10-08 MED ORDER — ONDANSETRON HCL 4 MG/2ML IJ SOLN
4.0000 mg | INTRAMUSCULAR | Status: DC | PRN
  Administered 2016-10-08: 4 mg via INTRAVENOUS
  Filled 2016-10-08: qty 2

## 2016-10-08 MED ORDER — ACETAMINOPHEN 325 MG PO TABS
650.0000 mg | ORAL_TABLET | Freq: Four times a day (QID) | ORAL | Status: DC | PRN
Start: 2016-10-08 — End: 2016-10-10

## 2016-10-08 MED ORDER — ENOXAPARIN SODIUM 40 MG/0.4ML ~~LOC~~ SOLN
40.0000 mg | SUBCUTANEOUS | Status: DC
  Administered 2016-10-08 – 2016-10-09 (×2): 40 mg via SUBCUTANEOUS
  Filled 2016-10-08 (×2): qty 0.4

## 2016-10-08 MED ORDER — ONDANSETRON HCL 4 MG/2ML IJ SOLN: 4.0000 mg | Freq: Four times a day (QID) | INTRAMUSCULAR | Status: AC | PRN

## 2016-10-08 NOTE — ED Provider Notes (Signed)
[facility] DEPT Provider Note   CSN: [id number] Arrival date & time: 10/08/16  1805     History   Chief Complaint Chief Complaint  Patient presents with  . Chest Pain  . Emesis    HPI Caleb [name] is a 81 y.o. male.  HPI  Pt was seen at 1830. Per pt, c/o gradual onset and persistence of multiple intermittent episodes of N/V that began 2 to 3 days ago. Has been associated with cough, and chest "pains" that began this afternoon. Describes the CP as mid-sternal "pressure." Denies abd pain, no CP/SOB, no back pain, no fevers, no black or blood in stools or emesis.    Past Medical History:  Diagnosis Date  . Acute exacerbation of chronic bronchitis ([place])   . Anemia   . BPH (benign prostatic hypertrophy)   . CAD (coronary artery disease)   . Diabetes mellitus without complication ([place])   . Diverticulitis   . Diverticulosis   . GERD (gastroesophageal reflux disease)   . GI bleed   . History of tuberculosis   . Hyperlipidemia   . Hyperthyroidism     Patient Active Problem List   Diagnosis Date Noted  . Acute GI bleeding 03/05/2016  . Lower GI bleed 03/05/2016  . History of colonic polyps   . Diverticulosis of colon without hemorrhage   . Hiatal hernia   . Duodenal diverticulum   . Dysphagia   . Acute blood loss anemia 11/22/2015  . Melena   . Sepsis ([place]) 11/20/2015  . CAP (community acquired pneumonia) 06/29/2014  . COPD (chronic obstructive pulmonary disease) ([place]) 06/29/2014  . Diabetes mellitus without complication ([place]) 40/98/1191  . Hemoptysis, non-massive currently 06/27/2014  . Black tarry stools-potentially related to chief problem of hemoptysis-has had polyps removed by colonoscopy in the past by Dr. Laural Golden 06/27/2014  . Cough with hemoptysis 06/27/2014  . 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 Arthritis of wrist, degenerative 06/30/2013  . Long term (current) use of anticoagulants 05/23/2011  . ECZEMA HERPETICUM 09/13/2010  . MIXED HYPERLIPIDEMIA  09/13/2010  . Essential hypertension, benign 09/13/2010  . Coronary atherosclerosis 09/13/2010  . Atrial fibrillation ([place]) 09/13/2010  . Shortness of breath 09/13/2010    Past Surgical History:  Procedure Laterality Date  . CHOLECYSTECTOMY    . COLONOSCOPY N/A 11/24/2015   Dr. Gala Romney: pancolonic diverticulosis, polys removed and path consistent with adenomas  . ERCP  09/2005   Dr. Laural Golden: biliary leak, ERCP with stent placement  . ERCP  11/2005   Dr. Laural Golden: ERCP with removal of biliary stent and sphincterotomy   . ESOPHAGOGASTRODUODENOSCOPY N/A 11/23/2015   Dr. Gala Romney: normal esophagus, s/p dilation. duodenal diverticulum  . GIVENS CAPSULE STUDY N/A 11/24/2015   unremarkable, however was incomplete. No obvious bleeding source  . KNEE ARTHROSCOPY    . THYROIDECTOMY         Home Medications    Prior to Admission medications   Medication Sig Start Date End Date Taking? Authorizing Provider  allopurinol (ZYLOPRIM) 300 MG tablet Take 300 mg by mouth daily.    Historical Provider, MD  amLODipine (NORVASC) 5 MG tablet Take 1 tablet (5 mg total) by mouth daily. 09/16/14   Arnoldo Lenis, MD  Ascorbic Acid (VITAMIN C) 500 MG tablet Take 500 mg by mouth 2 (two) times daily.     Historical Provider, MD  aspirin EC 81 MG tablet Take 1 tablet (81 mg total) by mouth daily. Stop taking for 2 weeks and then resume 03/07/16   Sinda Du,  MD  cholecalciferol (VITAMIN D) 1000 UNITS tablet Take 4,000 Units by mouth daily.    Historical Provider, MD  clobetasol cream (TEMOVATE) 9.93 % Apply 1 application topically 2 (two) times daily as needed (Dry Skin).  07/21/12   Historical Provider, MD  diltiazem (DILACOR XR) 240 MG 24 hr capsule Take 240 mg by mouth daily.      Historical Provider, MD  docusate sodium (COLACE) 100 MG capsule Take 300 mg by mouth 2 (two) times daily.    Historical Provider, MD  ferrous fumarate (HEMOCYTE - 106 MG FE) 325 (106 FE) MG TABS tablet Take 1 tablet by mouth 2 (two)  times daily.    Historical Provider, MD  finasteride (PROSCAR) 5 MG tablet Take 5 mg by mouth daily.      Historical Provider, MD  folic acid (FOLVITE) 1 MG tablet Take 1 mg by mouth daily.    Historical Provider, MD  galantamine (RAZADYNE) 8 MG tablet Take 8 mg by mouth 2 (two) times daily.      Historical Provider, MD  glyBURIDE (DIABETA) 5 MG tablet Take 5-10 mg by mouth 2 (two) times daily with a meal. 2 in the morning and 1 tab at night    Historical Provider, MD  guaiFENesin 200 MG tablet Take 400 mg by mouth 2 (two) times daily.    Historical Provider, MD  Insulin Glargine (LANTUS SOLOSTAR) 100 UNIT/ML Solostar Pen Inject 24 Units into the skin daily at 10 pm.     Historical Provider, MD  levothyroxine (SYNTHROID, LEVOTHROID) 125 MCG tablet Take 250 mcg by mouth daily before breakfast.    Historical Provider, MD  lisinopril (PRINIVIL,ZESTRIL) 20 MG tablet Take 20 mg by mouth daily.     Historical Provider, MD  loratadine (CLARITIN) 10 MG tablet Take 10 mg by mouth daily.      Historical Provider, MD  magnesium oxide (MAG-OX) 400 MG tablet Take 400 mg by mouth 2 (two) times daily.     Historical Provider, MD  meclizine (ANTIVERT) 25 MG tablet Take 1 tablet by mouth daily as needed (inner ear).  01/23/16   Historical Provider, MD  metFORMIN (GLUCOPHAGE) 1000 MG tablet Take 1,000 mg by mouth 2 (two) times daily with a meal.    Historical Provider, MD  Multiple Vitamin (MULTIVITAMIN WITH MINERALS) TABS tablet Take 1 tablet by mouth daily.    Historical Provider, MD  omeprazole (PRILOSEC) 20 MG capsule Take 20 mg by mouth 2 (two) times daily.      Historical Provider, MD  ondansetron (ZOFRAN) 4 MG tablet Take 1 tablet by mouth every 4 (four) hours as needed for nausea or vomiting.  11/07/15   Historical Provider, MD  oxybutynin (DITROPAN) 5 MG tablet Take 5 mg by mouth 2 (two) times daily.     Historical Provider, MD  predniSONE (DELTASONE) 10 MG tablet Take 4 tablets (40 mg total) by mouth daily  with breakfast. Take 4 daily 3 days, 3 daily 3 days, 2 daily 3 days 1 daily 3 days Patient not taking: Reported on 03/05/2016 11/25/15   Sinda Du, MD  Pseudoeph-Doxylamine-DM-APAP (NYQUIL PO) Take 10 mLs by mouth at bedtime.    Historical Provider, MD  rosuvastatin (CRESTOR) 40 MG tablet Take 20 mg by mouth daily.    Historical Provider, MD  sulfamethoxazole-trimethoprim (BACTRIM DS,SEPTRA DS) 800-160 MG tablet Take 1 tablet by mouth every 12 (twelve) hours. Patient not taking: Reported on 03/05/2016 11/25/15   Sinda Du, MD  warfarin (COUMADIN) 5 MG  tablet Take 1 tablet (5 mg total) by mouth at bedtime. Stop taking for 2 weeks. Discuss with your physician at the [facility] and resume if they order it 03/07/16   Sinda Du, MD    Family History Family History  Problem Relation Age of Onset  . Heart failure Mother   . Heart attack Father   . Stomach cancer Sister     deceased in her 11s from stomach cancer  . Colon cancer Neg Hx     Social History Social History  Substance Use Topics  . Smoking status: Former Smoker    Packs/day: 0.75    Years: 50.00    Types: Cigarettes    Start date: 09/16/1944    Quit date: 10/17/1998  . Smokeless tobacco: Never Used  . Alcohol use No     Allergies   Levaquin [levofloxacin in d5w]   Review of Systems Review of Systems ROS: Statement: All systems negative except as marked or noted in the HPI; Constitutional: Negative for fever and chills. ; ; Eyes: Negative for eye pain, redness and discharge. ; ; ENMT: Negative for ear pain, hoarseness, nasal congestion, sinus pressure and sore throat. ; ; Cardiovascular: +CP. Negative for palpitations, diaphoresis, dyspnea and peripheral edema. ; ; Respiratory: +cough. Negative for wheezing and stridor. ; ; Gastrointestinal: +N/V. Negative for diarrhea, abdominal pain, blood in stool, hematemesis, jaundice and rectal bleeding. . ; ; Genitourinary: Negative for dysuria, flank pain and hematuria. ; ;  Musculoskeletal: Negative for back pain and neck pain. Negative for swelling and trauma.; ; Skin: Negative for pruritus, rash, abrasions, blisters, bruising and skin lesion.; ; Neuro: Negative for headache, lightheadedness and neck stiffness. Negative for weakness, altered level of consciousness, altered mental status, extremity weakness, paresthesias, involuntary movement, seizure and syncope.      Physical Exam Updated Vital Signs BP 115/61 (BP Location: Left Arm)   Pulse 79   Temp 98.3 F (36.8 C) (Oral)   Resp 16   Ht '5\' 9"'$  (1.753 m)   Wt 194 lb (88 kg)   SpO2 94%   BMI 28.65 kg/m   Physical Exam 1835: Physical examination:  Nursing notes reviewed; Vital signs and O2 SAT reviewed;  Constitutional: Well developed, Well nourished, In no acute distress; Head:  Normocephalic, atraumatic; Eyes: EOMI, PERRL, No scleral icterus; ENMT: Mouth and pharynx normal, Mucous membranes dry; Neck: Supple, Full range of motion, No lymphadenopathy; Cardiovascular: Regular rate and rhythm, No gallop; Respiratory: Breath sounds clear & equal bilaterally, scattered wheezes. +moist cough during exam.  Speaking full sentences with ease, Normal respiratory effort/excursion; Chest: Nontender, Movement normal; Abdomen: Soft, +mild diffuse tenderness, +tympanitic, +distended, Normal bowel sounds; Genitourinary: No CVA tenderness; Extremities: Pulses normal, No tenderness, No edema, No calf edema or asymmetry.; Neuro: AA&Ox3, Major CN grossly intact.  Speech clear. No gross focal motor deficits in extremities.; Skin: Color normal, Warm, Dry.   ED Treatments / Results  Labs (all labs ordered are listed, but only abnormal results are displayed)   EKG  EKG Interpretation  Date/Time:  Monday October 08 2016 18:28:23 EDT Ventricular Rate:  78 PR Interval:    QRS Duration: 100 QT Interval:  409 QTC Calculation: 466 R Axis:   36 Text Interpretation:  Sinus rhythm Ventricular premature complex Abnormal R-wave  progression, early transition Probable inferior infarct, old Since last tracing of earlier today No significant change was found Confirmed by [facility]  MD, Nunzio Cory [phone]) on 10/08/2016 6:43:21 PM       Radiology  Procedures  Procedures (including critical care time)  Medications Ordered in ED Medications  ipratropium-albuterol (DUONEB) 0.5-2.5 (3) MG/3ML nebulizer solution 3 mL (not administered)  albuterol (PROVENTIL) (2.5 MG/3ML) 0.083% nebulizer solution 2.5 mg (not administered)  ondansetron (ZOFRAN) injection 4 mg (not administered)     Initial Impression / Assessment and Plan / ED Course  I have reviewed the triage vital signs and the nursing notes.  Pertinent labs & imaging results that were available during my care of the patient were reviewed by me and considered in my medical decision making (see chart for details).  MDM Reviewed: previous chart, nursing note and vitals Reviewed previous: labs and ECG Interpretation: labs, ECG and x-ray   ED ECG REPORT   Date: 10/08/2016  Rate: 79  Rhythm: normal sinus rhythm  QRS Axis: normal  Intervals: normal  ST/T Wave abnormalities: normal  Conduction Disutrbances:none  Narrative Interpretation:   Old EKG Reviewed: unchanged; no significant changes from previous EKG dated 03/06/2016.   Results for orders placed or performed during the hospital encounter of 10/08/16  CBC  Result Value Ref Range   WBC 10.0 4.0 - 10.5 K/uL   RBC 4.32 4.22 - 5.81 MIL/uL   Hemoglobin 13.7 13.0 - 17.0 g/dL   HCT 41.5 39.0 - 52.0 %   MCV 96.1 78.0 - 100.0 fL   MCH 31.7 26.0 - 34.0 pg   MCHC 33.0 30.0 - 36.0 g/dL   RDW 15.1 11.5 - 15.5 %   Platelets 183 150 - 400 K/uL  Troponin I  Result Value Ref Range   Troponin I <0.03 <0.03 ng/mL  Lipase, blood  Result Value Ref Range   Lipase 17 11 - 51 U/L  Comprehensive metabolic panel  Result Value Ref Range   Sodium 133 (L) 135 - 145 mmol/L   Potassium 3.6 3.5 - 5.1 mmol/L   Chloride 97  (L) 101 - 111 mmol/L   CO2 28 22 - 32 mmol/L   Glucose, Bld 132 (H) 65 - 99 mg/dL   BUN 24 (H) 6 - 20 mg/dL   Creatinine, Ser 0.69 0.61 - 1.24 mg/dL   Calcium 8.6 (L) 8.9 - 10.3 mg/dL   Total Protein 6.7 6.5 - 8.1 g/dL   Albumin 3.8 3.5 - 5.0 g/dL   AST 20 15 - 41 U/L   ALT 20 17 - 63 U/L   Alkaline Phosphatase 71 38 - 126 U/L   Total Bilirubin 1.0 0.3 - 1.2 mg/dL   GFR calc non Af Amer >60 >60 mL/min   GFR calc Af Amer >60 >60 mL/min   Anion gap 8 5 - 15  Urinalysis, Routine w reflex microscopic  Result Value Ref Range   Color, Urine YELLOW YELLOW   APPearance CLEAR CLEAR   Specific Gravity, Urine 1.025 1.005 - 1.030   pH 6.0 5.0 - 8.0   Glucose, UA NEGATIVE NEGATIVE mg/dL   Hgb urine dipstick NEGATIVE NEGATIVE   Bilirubin Urine NEGATIVE NEGATIVE   Ketones, ur NEGATIVE NEGATIVE mg/dL   Protein, ur NEGATIVE NEGATIVE mg/dL   Nitrite NEGATIVE NEGATIVE   Leukocytes, UA NEGATIVE NEGATIVE  Protime-INR  Result Value Ref Range   Prothrombin Time 13.3 11.4 - 15.2 seconds   INR 1.01   Lactic acid, plasma  Result Value Ref Range   Lactic Acid, Venous 1.1 0.5 - 1.9 mmol/L   Dg Chest 2 View Result Date: 10/08/2016 CLINICAL DATA:  Chest pain, wheezing, nausea, and vomiting. Remote history of tuberculosis. History of chronic bronchitis. EXAM: CHEST  2 VIEW COMPARISON:  11/20/2015 FINDINGS: Cardiomediastinal silhouette is unchanged, with the heart upper limits of normal in size. Aortic atherosclerosis is again noted. Peribronchial thickening is similar to the prior study. Mild bibasilar airspace opacities are similar to the prior study and may reflect atelectasis. No definite acute airspace consolidation, overt edema, pleural effusion, or pneumothorax is identified. No acute osseous abnormality is seen. IMPRESSION: 1. Chronic lung disease/bronchitic changes without evidence of acute abnormality. 2. Aortic atherosclerosis. Electronically Signed   By: Logan Bores M.D.   On: 10/08/2016 19:33    Dg Abd 2 Views Result Date: 10/08/2016 CLINICAL DATA:  Wheezing, nausea vomiting chest pain EXAM: ABDOMEN - 2 VIEW COMPARISON:  11/28/2015 FINDINGS: Small amount of right pleural effusion or thickening. Mild bibasilar atelectasis or scarring. No gross free air beneath the diaphragm. Surgical clips in the right upper quadrant. Mild gas-filled loops of central small bowel with scattered colonic bowel gas. Scattered fluid levels on upright exam suggest mild ileus. Calcified phleboliths in the right pelvis. Degenerative changes of the bilateral hips and spine. No pathologic calcifications. IMPRESSION: 1. Mild increased central small bowel gas with scattered fluid levels; there is gas present in the colon, findings could relate to a mild ileus. 2. Small amount of right pleural effusion or thickening. Bibasilar atelectasis or scarring. Electronically Signed   By: Donavan Foil M.D.   On: 10/08/2016 19:33     2015:  Short neb given for moist cough and wheezing with improvement; lungs coarse. IV zofran given for N/V with improvement. ASA given for CP. Pt states he "feels better now." Will observation admit. T/C to [facility] Dr. Darrick Meigs, case discussed, including:  HPI, pertinent PM/SHx, VS/PE, dx testing, ED course and treatment:  Agreeable to admit, requests to write temporary orders, obtain observation tele bed to Dr. Luan Pulling' service.   Final Clinical Impressions(s) / ED Diagnoses   Final diagnoses:  None    New Prescriptions New Prescriptions   No medications on file     Francine Graven, DO 10/09/16 1411

## 2016-10-08 NOTE — H&P (Signed)
TRH H&P    Patient Demographics:    Caleb [name], is a 81 y.o. male  MRN: [id number]  DOB - 01-Aug-1935  Admit Date - 10/08/2016  Referring MD/NP/PA: Dr. Thurnell Garbe  Outpatient Primary MD for the patient is Alonza Bogus, MD  Patient coming from: Home  Chief Complaint  Patient presents with  . Chest Pain  . Emesis      HPI:    Caleb [name]  is a 81 y.o. male, with a history of atrial fibrillation, not on anticoagulation, warfarin stopped by [facility] as per patient ,hypothyroidism, CAD, diabetes mellitus, diverticulosis came to the hospital with multiple episodes of nausea and vomiting which started 3 days ago. Patient says that he has not been able to keep anything down and gets vomiting after eating. He denies blood in the vomitus. He denies abdominal pain. No diarrhea. He also complains of some chest pain. Denies shortness of breath  In the ED abdominal x-ray showed findings consistent with mild ileus.   Review of systems:    In addition to the HPI above,  No Fever-chills, No Headache, No changes with Vision or hearing, No problems swallowing food or Liquids, No Blood in stool or Urine, No dysuria, No new skin rashes or bruises, No new joints pains-aches,  No new weakness, tingling, numbness in any extremity, No recent weight gain or loss, No polyuria, polydypsia or polyphagia, No significant Mental Stressors.  A full 10 point Review of Systems was done, except as stated above, all other Review of Systems were negative.   With Past History of the following :    Past Medical History:  Diagnosis Date  . Acute exacerbation of chronic bronchitis ([place])   . Anemia   . BPH (benign prostatic hypertrophy)   . CAD (coronary artery disease)   . Diabetes mellitus without complication ([place])   . Diverticulitis   . Diverticulosis   . GERD (gastroesophageal reflux disease)   . GI bleed   . History of  tuberculosis   . Hyperlipidemia   . Hyperthyroidism       Past Surgical History:  Procedure Laterality Date  . CHOLECYSTECTOMY    . COLONOSCOPY N/A 11/24/2015   Dr. Gala Romney: pancolonic diverticulosis, polys removed and path consistent with adenomas  . ERCP  09/2005   Dr. Laural Golden: biliary leak, ERCP with stent placement  . ERCP  11/2005   Dr. Laural Golden: ERCP with removal of biliary stent and sphincterotomy   . ESOPHAGOGASTRODUODENOSCOPY N/A 11/23/2015   Dr. Gala Romney: normal esophagus, s/p dilation. duodenal diverticulum  . GIVENS CAPSULE STUDY N/A 11/24/2015   unremarkable, however was incomplete. No obvious bleeding source  . KNEE ARTHROSCOPY    . THYROIDECTOMY        Social History:      Social History  Substance Use Topics  . Smoking status: Former Smoker    Packs/day: 0.75    Years: 50.00    Types: Cigarettes    Start date: 09/16/1944    Quit date: 10/17/1998  . Smokeless tobacco: Never Used  . Alcohol use  No       Family History :     Family History  Problem Relation Age of Onset  . Heart failure Mother   . Heart attack Father   . Stomach cancer Sister     deceased in her 57s from stomach cancer  . Colon cancer Neg Hx       Home Medications:   Prior to Admission medications   Medication Sig Start Date End Date Taking? Authorizing Provider  allopurinol (ZYLOPRIM) 300 MG tablet Take 300 mg by mouth daily.    Historical Provider, MD  amLODipine (NORVASC) 5 MG tablet Take 1 tablet (5 mg total) by mouth daily. 09/16/14   Arnoldo Lenis, MD  Ascorbic Acid (VITAMIN C) 500 MG tablet Take 500 mg by mouth 2 (two) times daily.     Historical Provider, MD  aspirin EC 81 MG tablet Take 1 tablet (81 mg total) by mouth daily. Stop taking for 2 weeks and then resume 03/07/16   Sinda Du, MD  cholecalciferol (VITAMIN D) 1000 UNITS tablet Take 4,000 Units by mouth daily.    Historical Provider, MD  clobetasol cream (TEMOVATE) 4.08 % Apply 1 application topically 2 (two) times  daily as needed (Dry Skin).  07/21/12   Historical Provider, MD  diltiazem (DILACOR XR) 240 MG 24 hr capsule Take 240 mg by mouth daily.      Historical Provider, MD  docusate sodium (COLACE) 100 MG capsule Take 300 mg by mouth 2 (two) times daily.    Historical Provider, MD  ferrous fumarate (HEMOCYTE - 106 MG FE) 325 (106 FE) MG TABS tablet Take 1 tablet by mouth 2 (two) times daily.    Historical Provider, MD  finasteride (PROSCAR) 5 MG tablet Take 5 mg by mouth daily.      Historical Provider, MD  folic acid (FOLVITE) 1 MG tablet Take 1 mg by mouth daily.    Historical Provider, MD  galantamine (RAZADYNE) 8 MG tablet Take 8 mg by mouth 2 (two) times daily.      Historical Provider, MD  glyBURIDE (DIABETA) 5 MG tablet Take 5-10 mg by mouth 2 (two) times daily with a meal. 2 in the morning and 1 tab at night    Historical Provider, MD  guaiFENesin 200 MG tablet Take 400 mg by mouth 2 (two) times daily.    Historical Provider, MD  Insulin Glargine (LANTUS SOLOSTAR) 100 UNIT/ML Solostar Pen Inject 24 Units into the skin daily at 10 pm.     Historical Provider, MD  levothyroxine (SYNTHROID, LEVOTHROID) 125 MCG tablet Take 250 mcg by mouth daily before breakfast.    Historical Provider, MD  lisinopril (PRINIVIL,ZESTRIL) 20 MG tablet Take 20 mg by mouth daily.     Historical Provider, MD  loratadine (CLARITIN) 10 MG tablet Take 10 mg by mouth daily.      Historical Provider, MD  magnesium oxide (MAG-OX) 400 MG tablet Take 400 mg by mouth 2 (two) times daily.     Historical Provider, MD  meclizine (ANTIVERT) 25 MG tablet Take 1 tablet by mouth daily as needed (inner ear).  01/23/16   Historical Provider, MD  metFORMIN (GLUCOPHAGE) 1000 MG tablet Take 1,000 mg by mouth 2 (two) times daily with a meal.    Historical Provider, MD  Multiple Vitamin (MULTIVITAMIN WITH MINERALS) TABS tablet Take 1 tablet by mouth daily.    Historical Provider, MD  omeprazole (PRILOSEC) 20 MG capsule Take 20 mg by mouth 2 (two)  times daily.  Historical Provider, MD  ondansetron (ZOFRAN) 4 MG tablet Take 1 tablet by mouth every 4 (four) hours as needed for nausea or vomiting.  11/07/15   Historical Provider, MD  oxybutynin (DITROPAN) 5 MG tablet Take 5 mg by mouth 2 (two) times daily.     Historical Provider, MD  predniSONE (DELTASONE) 10 MG tablet Take 4 tablets (40 mg total) by mouth daily with breakfast. Take 4 daily 3 days, 3 daily 3 days, 2 daily 3 days 1 daily 3 days Patient not taking: Reported on 03/05/2016 11/25/15   Sinda Du, MD  Pseudoeph-Doxylamine-DM-APAP (NYQUIL PO) Take 10 mLs by mouth at bedtime.    Historical Provider, MD  rosuvastatin (CRESTOR) 40 MG tablet Take 20 mg by mouth daily.    Historical Provider, MD  sulfamethoxazole-trimethoprim (BACTRIM DS,SEPTRA DS) 800-160 MG tablet Take 1 tablet by mouth every 12 (twelve) hours. Patient not taking: Reported on 03/05/2016 11/25/15   Sinda Du, MD  warfarin (COUMADIN) 5 MG tablet Take 1 tablet (5 mg total) by mouth at bedtime. Stop taking for 2 weeks. Discuss with your physician at the [facility] and resume if they order it 03/07/16   Sinda Du, MD     Allergies:     Allergies  Allergen Reactions  . Levaquin [Levofloxacin In D5w]     SWELLING     Physical Exam:   Vitals  Blood pressure 109/59, pulse 84, temperature 98.3 F (36.8 C), temperature source Oral, resp. rate 13, height '5\' 9"'$  (1.753 m), weight 88 kg (194 lb), SpO2 94 %.  1.  General: Appears in no acute distress  2. Psychiatric:  Intact judgement and  insight, awake alert, oriented x 3.  3. Neurologic: No focal neurological deficits, all cranial nerves intact.Strength 5/5 all 4 extremities, sensation intact all 4 extremities, plantars down going.  4. Eyes :  anicteric sclerae, moist conjunctivae with no lid lag. PERRLA.  5. ENMT:  Oropharynx clear with moist mucous membranes and good dentition  6. Neck:  supple, no cervical lymphadenopathy appriciated, No  thyromegaly  7. Respiratory : Normal respiratory effort, good air movement bilaterally,clear to  auscultation bilaterally  8. Cardiovascular : RRR, no gallops, rubs or murmurs, no leg edema  9. Gastrointestinal:  Positive bowel sounds, abdomen soft, distended, non-tender to palpation,no hepatosplenomegaly, no rigidity or guarding       10. Skin:  No cyanosis, normal texture and turgor, no rash, lesions or ulcers  11.Musculoskeletal:  Good muscle tone,  joints appear normal , no effusions,  normal range of motion    Data Review:    CBC  Recent Labs Lab 10/08/16 1847  WBC 10.0  HGB 13.7  HCT 41.5  PLT 183  MCV 96.1  MCH 31.7  MCHC 33.0  RDW 15.1   ------------------------------------------------------------------------------------------------------------------  Chemistries   Recent Labs Lab 10/08/16 1847  NA 133*  K 3.6  CL 97*  CO2 28  GLUCOSE 132*  BUN 24*  CREATININE 0.69  CALCIUM 8.6*  AST 20  ALT 20  ALKPHOS 71  BILITOT 1.0   ------------------------------------------------------------------------------------------------------------------  ------------------------------------------------------------------------------------------------------------------ GFR: Estimated Creatinine Clearance: 82.2 mL/min (by C-G formula based on SCr of 0.69 mg/dL). Liver Function Tests:  Recent Labs Lab 10/08/16 1847  AST 20  ALT 20  ALKPHOS 71  BILITOT 1.0  PROT 6.7  ALBUMIN 3.8    Recent Labs Lab 10/08/16 1847  LIPASE 17   No results for input(s): AMMONIA in the last 168 hours. Coagulation Profile:  Recent Labs Lab 10/08/16 1847  INR 1.01   Cardiac Enzymes:  Recent Labs Lab 10/08/16 1847  TROPONINI <0.03       Imaging Results:    Dg Chest 2 View  Result Date: 10/08/2016 CLINICAL DATA:  Chest pain, wheezing, nausea, and vomiting. Remote history of tuberculosis. History of chronic bronchitis. EXAM: CHEST  2 VIEW COMPARISON:  11/20/2015  FINDINGS: Cardiomediastinal silhouette is unchanged, with the heart upper limits of normal in size. Aortic atherosclerosis is again noted. Peribronchial thickening is similar to the prior study. Mild bibasilar airspace opacities are similar to the prior study and may reflect atelectasis. No definite acute airspace consolidation, overt edema, pleural effusion, or pneumothorax is identified. No acute osseous abnormality is seen. IMPRESSION: 1. Chronic lung disease/bronchitic changes without evidence of acute abnormality. 2. Aortic atherosclerosis. Electronically Signed   By: Logan Bores M.D.   On: 10/08/2016 19:33   Dg Abd 2 Views  Result Date: 10/08/2016 CLINICAL DATA:  Wheezing, nausea vomiting chest pain EXAM: ABDOMEN - 2 VIEW COMPARISON:  11/28/2015 FINDINGS: Small amount of right pleural effusion or thickening. Mild bibasilar atelectasis or scarring. No gross free air beneath the diaphragm. Surgical clips in the right upper quadrant. Mild gas-filled loops of central small bowel with scattered colonic bowel gas. Scattered fluid levels on upright exam suggest mild ileus. Calcified phleboliths in the right pelvis. Degenerative changes of the bilateral hips and spine. No pathologic calcifications. IMPRESSION: 1. Mild increased central small bowel gas with scattered fluid levels; there is gas present in the colon, findings could relate to a mild ileus. 2. Small amount of right pleural effusion or thickening. Bibasilar atelectasis or scarring. Electronically Signed   By: Donavan Foil M.D.   On: 10/08/2016 19:33    My personal review of EKG: Rhythm NSR   Assessment & Plan:    Active Problems:   Atrial fibrillation (HCC)   Nausea and vomiting   Ileus (HCC)   Chest pain   1. Ileus- placed under observation, keep nothing by mouth. If vomiting not controlled with conservative measures consider NG tube placement. Will repeat abdominal x-ray in a.m. 2. Atrial fibrillation- patient says warfarin was  recently stopped, heart rate is controlled. Will hold all by mouth medications at this time. Will use when necessary IV metoprolol 5 mg every 6 hours when necessary for heart rate greater than 110. Hold aspirin at this time 3. Hypothyroidism- we'll start IV Synthroid 125 g daily, patient takes Synthroid to 50 g daily at home. 4. Chest pain- likely from repeated nausea and vomiting, EKG showed no significant abnormality. We'll cycle troponin every 6 hours 3 5. Diabetes mellitus- hold oral hypoglycemic agents, check CBG every 4 hours, start sliding scale insulin with NovoLog. 6. Hypertension- hold amlodipine, lisinopril. Start metoprolol when necessary as above.   DVT Prophylaxis-   Lovenox   AM Labs Ordered, also please review Full Orders  Family Communication: No family at bedside.  Code Status:  Full code  Admission status: Observation    Time spent in minutes : 60 minutes   Denzil Bristol S M.D on 10/08/2016 at 8:50 PM  Between 7am to 7pm - Pager - [phone]. After 7pm go to [email or web address] - password [facility]  [facility] - Office  [phone]

## 2016-10-08 NOTE — ED Notes (Signed)
Patient unable to provide urine specimen at this time. Urinal at bedside.

## 2016-10-08 NOTE — ED Triage Notes (Signed)
Patient complaining of vomiting x 3 days with chest pain starting today.

## 2016-10-09 ENCOUNTER — Inpatient Hospital Stay (HOSPITAL_COMMUNITY): Payer: Medicare Other

## 2016-10-09 LAB — COMPREHENSIVE METABOLIC PANEL
ALT: 18 U/L (ref 17–63)
AST: 19 U/L (ref 15–41)
Albumin: 3.4 g/dL — ABNORMAL LOW (ref 3.5–5.0)
Alkaline Phosphatase: 64 U/L (ref 38–126)
Anion gap: 7 (ref 5–15)
BUN: 24 mg/dL — AB (ref 6–20)
CHLORIDE: 98 mmol/L — AB (ref 101–111)
CO2: 29 mmol/L (ref 22–32)
CREATININE: 0.66 mg/dL (ref 0.61–1.24)
Calcium: 8 mg/dL — ABNORMAL LOW (ref 8.9–10.3)
GFR calc Af Amer: >60 mL/min (ref >60)
GFR calc non Af Amer: >60 mL/min (ref >60)
Glucose, Bld: 166 mg/dL — ABNORMAL HIGH (ref 65–99)
Potassium: 3.8 mmol/L (ref 3.5–5.1)
SODIUM: 134 mmol/L — AB (ref 135–145)
Total Bilirubin: 0.9 mg/dL (ref 0.3–1.2)
Total Protein: 6 g/dL — ABNORMAL LOW (ref 6.5–8.1)

## 2016-10-09 LAB — GLUCOSE, CAPILLARY
GLUCOSE-CAPILLARY: 174 mg/dL — AB (ref 65–99)
GLUCOSE-CAPILLARY: 152 mg/dL — AB (ref 65–99)
GLUCOSE-CAPILLARY: 124 mg/dL — AB (ref 65–99)
Glucose-Capillary: 139 mg/dL — ABNORMAL HIGH (ref 65–99)
Glucose-Capillary: 151 mg/dL — ABNORMAL HIGH (ref 65–99)
Glucose-Capillary: 160 mg/dL — ABNORMAL HIGH (ref 65–99)

## 2016-10-09 LAB — CBC
HCT: 37.6 % — ABNORMAL LOW (ref 39.0–52.0)
Hemoglobin: 12.4 g/dL — ABNORMAL LOW (ref 13.0–17.0)
MCH: 32.1 pg (ref 26.0–34.0)
MCHC: 33 g/dL (ref 30.0–36.0)
MCV: 97.4 fL (ref 78.0–100.0)
PLATELETS: 156 10*3/uL (ref 150–400)
RBC: 3.86 MIL/uL — AB (ref 4.22–5.81)
RDW: 15.2 % (ref 11.5–15.5)
WBC: 8.4 10*3/uL (ref 4.0–10.5)

## 2016-10-09 LAB — TROPONIN I: Troponin I: <0.03 ng/mL (ref <0.03)

## 2016-10-09 LAB — MRSA PCR SCREENING: MRSA by PCR: NEGATIVE

## 2016-10-09 MED ORDER — ONDANSETRON HCL 4 MG PO TABS
4.0000 mg | ORAL_TABLET | Freq: Three times a day (TID) | ORAL | Status: DC | PRN
Start: 2016-10-09 — End: 2016-10-10

## 2016-10-09 MED ORDER — FERROUS SULFATE 325 (65 FE) MG PO TABS
325.0000 mg | ORAL_TABLET | Freq: Two times a day (BID) | ORAL | Status: DC
  Administered 2016-10-09 – 2016-10-10 (×3): 325 mg via ORAL
  Filled 2016-10-09 (×3): qty 1

## 2016-10-09 MED ORDER — DILTIAZEM HCL ER COATED BEADS 120 MG PO CP24
120.0000 mg | ORAL_CAPSULE | Freq: Every day | ORAL | Status: DC
  Administered 2016-10-09 – 2016-10-10 (×2): 120 mg via ORAL
  Filled 2016-10-09 (×2): qty 1

## 2016-10-09 MED ORDER — GALANTAMINE HYDROBROMIDE 4 MG PO TABS
8.0000 mg | ORAL_TABLET | Freq: Two times a day (BID) | ORAL | Status: DC
  Administered 2016-10-09 – 2016-10-10 (×2): 8 mg via ORAL
  Filled 2016-10-09 (×7): qty 2

## 2016-10-09 MED ORDER — MONTELUKAST SODIUM 10 MG PO TABS
10.0000 mg | ORAL_TABLET | Freq: Every day | ORAL | Status: DC
  Administered 2016-10-09: 10 mg via ORAL
  Filled 2016-10-09: qty 1

## 2016-10-09 MED ORDER — FOLIC ACID 1 MG PO TABS
1.0000 mg | ORAL_TABLET | Freq: Every day | ORAL | Status: DC
  Administered 2016-10-10: 1 mg via ORAL
  Filled 2016-10-09: qty 1

## 2016-10-09 MED ORDER — FINASTERIDE 5 MG PO TABS
5.0000 mg | ORAL_TABLET | Freq: Every day | ORAL | Status: DC
  Administered 2016-10-10: 5 mg via ORAL
  Filled 2016-10-09 (×4): qty 1

## 2016-10-09 MED ORDER — LISINOPRIL 10 MG PO TABS
10.0000 mg | ORAL_TABLET | Freq: Every day | ORAL | Status: DC
  Administered 2016-10-09 – 2016-10-10 (×2): 10 mg via ORAL
  Filled 2016-10-09 (×2): qty 1

## 2016-10-09 MED ORDER — MECLIZINE HCL 12.5 MG PO TABS: 25.0000 mg | ORAL_TABLET | Freq: Four times a day (QID) | ORAL | Status: DC | PRN

## 2016-10-09 MED ORDER — LEVOTHYROXINE SODIUM 75 MCG PO TABS
150.0000 ug | ORAL_TABLET | Freq: Every day | ORAL | Status: DC
  Administered 2016-10-09 – 2016-10-10 (×2): 150 ug via ORAL
  Filled 2016-10-09 (×2): qty 2

## 2016-10-09 MED ORDER — PANTOPRAZOLE SODIUM 40 MG PO TBEC
40.0000 mg | DELAYED_RELEASE_TABLET | Freq: Two times a day (BID) | ORAL | Status: DC
  Administered 2016-10-09 – 2016-10-10 (×3): 40 mg via ORAL
  Filled 2016-10-09 (×3): qty 1

## 2016-10-09 MED ORDER — LORATADINE 10 MG PO TABS
10.0000 mg | ORAL_TABLET | Freq: Every day | ORAL | Status: DC
  Administered 2016-10-09 – 2016-10-10 (×2): 10 mg via ORAL
  Filled 2016-10-09 (×2): qty 1

## 2016-10-09 MED ORDER — ALLOPURINOL 300 MG PO TABS: 300.0000 mg | ORAL_TABLET | Freq: Every day | ORAL | Status: DC | PRN

## 2016-10-09 MED ORDER — TAMSULOSIN HCL 0.4 MG PO CAPS
0.4000 mg | ORAL_CAPSULE | Freq: Every day | ORAL | Status: DC
  Administered 2016-10-09 – 2016-10-10 (×2): 0.4 mg via ORAL
  Filled 2016-10-09 (×2): qty 1

## 2016-10-09 NOTE — Progress Notes (Signed)
Patient doing well, no nausea or vomiting. Diet was advanced and patient has tolerated it well. Resting quietly at this time.

## 2016-10-09 NOTE — Care Management Note (Signed)
Case Management Note  Patient Details  Name: Caleb [name] MRN: [id number] Date of Birth: 1935-02-18  Subjective/Objective:                  Pt admitted with SBO. He is from home, lives with his wife and son. He is ind with ADL's He has PCP, drives himself to appointments and has no difficulty affording medications. He plans to return home with self care at DC. He is connected with the [facility]. They are aware of his admission and anticipated LOS.  Action/Plan: Will [facility] to updated [facility] and pt status at [facility].  Expected Discharge Date:  10/11/16               Expected Discharge Plan:  Home/Self Care  In-House Referral:  NA  Discharge planning Services  CM Consult  Post Acute Care Choice:  NA Choice offered to:  NA  Status of Service:  Completed, signed off  I Sherald Barge, RN 10/09/2016, 3:04 PM

## 2016-10-09 NOTE — Progress Notes (Signed)
Report called to [employer]. {atient going to 3rd floor room 332.

## 2016-10-09 NOTE — Progress Notes (Signed)
Subjective: He was admitted yesterday with nausea and vomiting. He had ileus by abdominal film. He says he feels better. He has not had any vomiting during the night but he has been nothing by mouth. He says he is passing gas but has not had a bowel movement. He is not having any more chest pain. He does have cough and congestion which is a chronic problem. No abdominal pain.  Objective: Vital signs in last 24 hours: Temp:  [97 F (36.1 C)-98.3 F (36.8 C)] 97 F (36.1 C) (10/24 0400) Pulse Rate:  [75-84] 76 (10/24 0400) Resp:  [13-17] 16 (10/24 0400) BP: (103-118)/(57-72) 118/63 (10/24 0400) SpO2:  [90 %-94 %] 90 % (10/24 0400) Weight:  [87.5 kg (192 lb 14.4 oz)-88 kg (194 lb)] 87.6 kg (193 lb 2 oz) (10/24 0500) Weight change:  Last BM Date: 10/06/16  Intake/Output from previous day: 10/23 0701 - 10/24 0700 In: -  Out: 700 [Urine:700]  PHYSICAL EXAM General appearance: alert, cooperative and mild distress Resp: rhonchi bilaterally Cardio: His heart is irregular with no gallop GI: His abdomen is soft with no masses no tenderness and his bowel sounds are present and active Extremities: Chronic venous stasis changes Mucous membranes are slightly dry. His skin is warm and dry.  Lab Results:  Results for orders placed or performed during the hospital encounter of 10/08/16 (from the past 48 hour(s))  CBC     Status: None   Collection Time: 10/08/16  6:47 PM  Result Value Ref Range   WBC 10.0 4.0 - 10.5 K/uL   RBC 4.32 4.22 - 5.81 MIL/uL   Hemoglobin 13.7 13.0 - 17.0 g/dL   HCT 41.5 39.0 - 52.0 %   MCV 96.1 78.0 - 100.0 fL   MCH 31.7 26.0 - 34.0 pg   MCHC 33.0 30.0 - 36.0 g/dL   RDW 15.1 11.5 - 15.5 %   Platelets 183 150 - 400 K/uL  Troponin I     Status: None   Collection Time: 10/08/16  6:47 PM  Result Value Ref Range   Troponin I <0.03 <0.03 ng/mL  Lipase, blood     Status: None   Collection Time: 10/08/16  6:47 PM  Result Value Ref Range   Lipase 17 11 - 51 U/L   Comprehensive metabolic panel     Status: Abnormal   Collection Time: 10/08/16  6:47 PM  Result Value Ref Range   Sodium 133 (L) 135 - 145 mmol/L   Potassium 3.6 3.5 - 5.1 mmol/L   Chloride 97 (L) 101 - 111 mmol/L   CO2 28 22 - 32 mmol/L   Glucose, Bld 132 (H) 65 - 99 mg/dL   BUN 24 (H) 6 - 20 mg/dL   Creatinine, Ser 0.69 0.61 - 1.24 mg/dL   Calcium 8.6 (L) 8.9 - 10.3 mg/dL   Total Protein 6.7 6.5 - 8.1 g/dL   Albumin 3.8 3.5 - 5.0 g/dL   AST 20 15 - 41 U/L   ALT 20 17 - 63 U/L   Alkaline Phosphatase 71 38 - 126 U/L   Total Bilirubin 1.0 0.3 - 1.2 mg/dL   GFR calc non Af Amer >60 >60 mL/min   GFR calc Af Amer >60 >60 mL/min    Comment: (NOTE) The eGFR has been calculated using the CKD EPI equation. This calculation has not been validated in all clinical situations. eGFR's persistently <60 mL/min signify possible Chronic Kidney Disease.    Anion gap 8 5 - 15  Protime-INR     Status: None   Collection Time: 10/08/16  6:47 PM  Result Value Ref Range   Prothrombin Time 13.3 11.4 - 15.2 seconds   INR 1.01   Lactic acid, plasma     Status: None   Collection Time: 10/08/16  6:47 PM  Result Value Ref Range   Lactic Acid, Venous 1.1 0.5 - 1.9 mmol/L  Urinalysis, Routine w reflex microscopic     Status: None   Collection Time: 10/08/16  8:26 PM  Result Value Ref Range   Color, Urine YELLOW YELLOW   APPearance CLEAR CLEAR   Specific Gravity, Urine 1.025 1.005 - 1.030   pH 6.0 5.0 - 8.0   Glucose, UA NEGATIVE NEGATIVE mg/dL   Hgb urine dipstick NEGATIVE NEGATIVE   Bilirubin Urine NEGATIVE NEGATIVE   Ketones, ur NEGATIVE NEGATIVE mg/dL   Protein, ur NEGATIVE NEGATIVE mg/dL   Nitrite NEGATIVE NEGATIVE   Leukocytes, UA NEGATIVE NEGATIVE    Comment: MICROSCOPIC NOT DONE ON URINES WITH NEGATIVE PROTEIN, BLOOD, LEUKOCYTES, NITRITE, OR GLUCOSE <1000 mg/dL.  Troponin I (q 6hr x 3)     Status: None   Collection Time: 10/08/16 10:47 PM  Result Value Ref Range   Troponin I <0.03 <0.03  ng/mL  Glucose, capillary     Status: Abnormal   Collection Time: 10/09/16 12:41 AM  Result Value Ref Range   Glucose-Capillary 139 (H) 65 - 99 mg/dL   Comment 1 Notify RN   CBC     Status: Abnormal   Collection Time: 10/09/16  4:27 AM  Result Value Ref Range   WBC 8.4 4.0 - 10.5 K/uL   RBC 3.86 (L) 4.22 - 5.81 MIL/uL   Hemoglobin 12.4 (L) 13.0 - 17.0 g/dL   HCT 37.6 (L) 39.0 - 52.0 %   MCV 97.4 78.0 - 100.0 fL   MCH 32.1 26.0 - 34.0 pg   MCHC 33.0 30.0 - 36.0 g/dL   RDW 15.2 11.5 - 15.5 %   Platelets 156 150 - 400 K/uL  Comprehensive metabolic panel     Status: Abnormal   Collection Time: 10/09/16  4:27 AM  Result Value Ref Range   Sodium 134 (L) 135 - 145 mmol/L   Potassium 3.8 3.5 - 5.1 mmol/L   Chloride 98 (L) 101 - 111 mmol/L   CO2 29 22 - 32 mmol/L   Glucose, Bld 166 (H) 65 - 99 mg/dL   BUN 24 (H) 6 - 20 mg/dL   Creatinine, Ser 0.66 0.61 - 1.24 mg/dL   Calcium 8.0 (L) 8.9 - 10.3 mg/dL   Total Protein 6.0 (L) 6.5 - 8.1 g/dL   Albumin 3.4 (L) 3.5 - 5.0 g/dL   AST 19 15 - 41 U/L   ALT 18 17 - 63 U/L   Alkaline Phosphatase 64 38 - 126 U/L   Total Bilirubin 0.9 0.3 - 1.2 mg/dL   GFR calc non Af Amer >60 >60 mL/min   GFR calc Af Amer >60 >60 mL/min    Comment: (NOTE) The eGFR has been calculated using the CKD EPI equation. This calculation has not been validated in all clinical situations. eGFR's persistently <60 mL/min signify possible Chronic Kidney Disease.    Anion gap 7 5 - 15  Troponin I (q 6hr x 3)     Status: None   Collection Time: 10/09/16  4:27 AM  Result Value Ref Range   Troponin I <0.03 <0.03 ng/mL  Glucose, capillary     Status: Abnormal  Collection Time: 10/09/16  4:34 AM  Result Value Ref Range   Glucose-Capillary 151 (H) 65 - 99 mg/dL   Comment 1 Notify RN     ABGS No results for input(s): PHART, PO2ART, TCO2, HCO3 in the last 72 hours.  Invalid input(s): PCO2 CULTURES No results found for this or any previous visit (from the past 240  hour(s)). Studies/Results: Dg Chest 2 View  Result Date: 10/08/2016 CLINICAL DATA:  Chest pain, wheezing, nausea, and vomiting. Remote history of tuberculosis. History of chronic bronchitis. EXAM: CHEST  2 VIEW COMPARISON:  11/20/2015 FINDINGS: Cardiomediastinal silhouette is unchanged, with the heart upper limits of normal in size. Aortic atherosclerosis is again noted. Peribronchial thickening is similar to the prior study. Mild bibasilar airspace opacities are similar to the prior study and may reflect atelectasis. No definite acute airspace consolidation, overt edema, pleural effusion, or pneumothorax is identified. No acute osseous abnormality is seen. IMPRESSION: 1. Chronic lung disease/bronchitic changes without evidence of acute abnormality. 2. Aortic atherosclerosis. Electronically Signed   By: Logan Bores M.D.   On: 10/08/2016 19:33   Dg Abd 2 Views  Result Date: 10/08/2016 CLINICAL DATA:  Wheezing, nausea vomiting chest pain EXAM: ABDOMEN - 2 VIEW COMPARISON:  11/28/2015 FINDINGS: Small amount of right pleural effusion or thickening. Mild bibasilar atelectasis or scarring. No gross free air beneath the diaphragm. Surgical clips in the right upper quadrant. Mild gas-filled loops of central small bowel with scattered colonic bowel gas. Scattered fluid levels on upright exam suggest mild ileus. Calcified phleboliths in the right pelvis. Degenerative changes of the bilateral hips and spine. No pathologic calcifications. IMPRESSION: 1. Mild increased central small bowel gas with scattered fluid levels; there is gas present in the colon, findings could relate to a mild ileus. 2. Small amount of right pleural effusion or thickening. Bibasilar atelectasis or scarring. Electronically Signed   By: Donavan Foil M.D.   On: 10/08/2016 19:33    Medications:  Prior to Admission:  Prescriptions Prior to Admission  Medication Sig Dispense Refill Last Dose  . allopurinol (ZYLOPRIM) 300 MG tablet Take 300  mg by mouth daily as needed (for gout).   unknown  . Ascorbic Acid (VITAMIN C) 500 MG tablet Take 500 mg by mouth 2 (two) times daily.    10/08/2016 at Unknown time  . cholecalciferol (VITAMIN D) 1000 UNITS tablet Take 4,000 Units by mouth daily.   10/08/2016 at Unknown time  . diltiazem (CARDIZEM CD) 120 MG 24 hr capsule Take 120 mg by mouth daily.   10/08/2016 at Unknown time  . docusate sodium (COLACE) 100 MG capsule Take 300 mg by mouth 2 (two) times daily.   10/08/2016 at Unknown time  . ferrous sulfate 325 (65 FE) MG tablet Take 325 mg by mouth 2 (two) times daily with a meal.   10/08/2016 at Unknown time  . finasteride (PROSCAR) 5 MG tablet Take 5 mg by mouth daily.     10/08/2016 at Unknown time  . folic acid (FOLVITE) 1 MG tablet Take 1 mg by mouth daily.   10/08/2016 at Unknown time  . galantamine (RAZADYNE) 8 MG tablet Take 8 mg by mouth 2 (two) times daily.     10/08/2016 at Unknown time  . guaiFENesin 200 MG tablet Take 400 mg by mouth 2 (two) times daily.   10/08/2016 at Unknown time  . Insulin Glargine (LANTUS SOLOSTAR) 100 UNIT/ML Solostar Pen Inject 24 Units into the skin every evening.    10/07/2016 at Unknown time  .  levothyroxine (SYNTHROID, LEVOTHROID) 150 MCG tablet Take 150 mcg by mouth daily before breakfast.   10/08/2016 at Unknown time  . lisinopril (PRINIVIL,ZESTRIL) 20 MG tablet Take 10 mg by mouth daily.    10/08/2016 at Unknown time  . loratadine (CLARITIN) 10 MG tablet Take 10 mg by mouth daily.     10/08/2016 at Unknown time  . magnesium oxide (MAG-OX) 400 MG tablet Take 400 mg by mouth 2 (two) times daily.    10/08/2016 at Unknown time  . meclizine (ANTIVERT) 25 MG tablet Take 25 mg by mouth 4 (four) times daily as needed for dizziness or nausea.   unknown  . metFORMIN (GLUCOPHAGE) 1000 MG tablet Take 1,000 mg by mouth 2 (two) times daily with a meal.   10/08/2016 at Unknown time  . montelukast (SINGULAIR) 10 MG tablet Take 10 mg by mouth at bedtime.   10/07/2016 at  Unknown time  . Multiple Vitamin (MULTIVITAMIN WITH MINERALS) TABS tablet Take 1 tablet by mouth daily.   10/08/2016 at Unknown time  . ondansetron (ZOFRAN) 4 MG tablet Take 4 mg by mouth every 8 (eight) hours as needed for nausea or vomiting.   unknown  . oxybutynin (DITROPAN) 5 MG tablet Take 5 mg by mouth 2 (two) times daily.    10/08/2016 at Unknown time  . pantoprazole (PROTONIX) 40 MG tablet Take 40 mg by mouth 2 (two) times daily.   10/08/2016 at Unknown time  . rosuvastatin (CRESTOR) 40 MG tablet Take 20 mg by mouth at bedtime.    10/07/2016 at Unknown time  . tamsulosin (FLOMAX) 0.4 MG CAPS capsule Take 0.4 mg by mouth daily.   10/08/2016 at Unknown time   Scheduled: . sodium chloride   Intravenous STAT  . diltiazem  120 mg Oral Daily  . enoxaparin (LOVENOX) injection  40 mg Subcutaneous Q24H  . ferrous sulfate  325 mg Oral BID WC  . finasteride  5 mg Oral Daily  . folic acid  1 mg Oral Daily  . galantamine  8 mg Oral BID  . insulin aspart  0-9 Units Subcutaneous TID WC  . levothyroxine  125 mcg Intravenous QAC breakfast  . levothyroxine  150 mcg Oral QAC breakfast  . lisinopril  10 mg Oral Daily  . loratadine  10 mg Oral Daily  . montelukast  10 mg Oral QHS  . pantoprazole  40 mg Oral BID  . tamsulosin  0.4 mg Oral Daily   Continuous: . sodium chloride 100 mL/hr at 10/08/16 2245   [facility]:[id number] **OR** acetaminophen, allopurinol, meclizine, metoprolol, ondansetron (ZOFRAN) IV, ondansetron  Assesment: He was admitted with nausea and vomiting and I think he was probably somewhat dehydrated. That appears to be improving. He has ileus by x-ray but seems clinically improved. He says he wants to eat breakfast.  He has chest pain and thus far troponin is negative this is thought to be related to his vomiting.  He has hypertension and he is off his regular meds now. If he's able to tolerate clear liquids I will start him back  He has diabetes and is on sliding scale. He's on  basal insulin at home but I'm not going to start that until I see how he does  He has severe COPD mostly chronic bronchitis and that is stable at least for now Active Problems:   Atrial fibrillation (HCC)   Nausea and vomiting   Ileus ([place])   Chest pain    Plan: Advance diet. Clear liquids to start and  then go to carp modified/heart healthy if he tolerates that. If he tolerates his diet I'm going to start him on by mouth meds.    LOS: 1 day   Shakeia Krus L 10/09/2016, 7:18 AM

## 2016-10-10 LAB — GLUCOSE, CAPILLARY
GLUCOSE-CAPILLARY: 115 mg/dL — AB (ref 65–99)
GLUCOSE-CAPILLARY: 144 mg/dL — AB (ref 65–99)
GLUCOSE-CAPILLARY: 148 mg/dL — AB (ref 65–99)
GLUCOSE-CAPILLARY: 148 mg/dL — AB (ref 65–99)

## 2016-10-10 LAB — URINE CULTURE: CULTURE: NO GROWTH

## 2016-10-10 LAB — HEMOGLOBIN A1C
Hgb A1c MFr Bld: 6.3 % — ABNORMAL HIGH (ref 4.8–5.6)
Mean Plasma Glucose: 134 mg/dL

## 2016-10-10 NOTE — Progress Notes (Signed)
Subjective: He says he feels better. He says he can't eat the hospital diet. His family is going to get him some food. No nausea vomiting or diarrhea.  Objective: Vital signs in last 24 hours: Temp:  [97.9 F (36.6 C)-98.2 F (36.8 C)] 98.2 F (36.8 C) (10/25 0523) Pulse Rate:  [72-83] 72 (10/25 0523) Resp:  [13-25] 20 (10/25 0523) BP: (117-138)/(46-80) 138/80 (10/25 0545) SpO2:  [90 %-96 %] 92 % (10/25 0523) Weight change:  Last BM Date: 10/09/16  Intake/Output from previous day: 10/24 0701 - 10/25 0700 In: 2720 [P.O.:720; I.V.:2000] Out: 1400 [Urine:1400]  PHYSICAL EXAM General appearance: alert, cooperative and no distress Resp: clear to auscultation bilaterally Cardio: irregularly irregular rhythm GI: soft, non-tender; bowel sounds normal; no masses,  no organomegaly Extremities: extremities normal, atraumatic, no cyanosis or edema Mucous membranes are moist skin is warm and dry  Lab Results:  Results for orders placed or performed during the hospital encounter of 10/08/16 (from the past 48 hour(s))  CBC     Status: None   Collection Time: 10/08/16  6:47 PM  Result Value Ref Range   WBC 10.0 4.0 - 10.5 K/uL   RBC 4.32 4.22 - 5.81 MIL/uL   Hemoglobin 13.7 13.0 - 17.0 g/dL   HCT 41.5 39.0 - 52.0 %   MCV 96.1 78.0 - 100.0 fL   MCH 31.7 26.0 - 34.0 pg   MCHC 33.0 30.0 - 36.0 g/dL   RDW 15.1 11.5 - 15.5 %   Platelets 183 150 - 400 K/uL  Troponin I     Status: None   Collection Time: 10/08/16  6:47 PM  Result Value Ref Range   Troponin I <0.03 <0.03 ng/mL  Lipase, blood     Status: None   Collection Time: 10/08/16  6:47 PM  Result Value Ref Range   Lipase 17 11 - 51 U/L  Comprehensive metabolic panel     Status: Abnormal   Collection Time: 10/08/16  6:47 PM  Result Value Ref Range   Sodium 133 (L) 135 - 145 mmol/L   Potassium 3.6 3.5 - 5.1 mmol/L   Chloride 97 (L) 101 - 111 mmol/L   CO2 28 22 - 32 mmol/L   Glucose, Bld 132 (H) 65 - 99 mg/dL   BUN 24 (H) 6 -  20 mg/dL   Creatinine, Ser 0.69 0.61 - 1.24 mg/dL   Calcium 8.6 (L) 8.9 - 10.3 mg/dL   Total Protein 6.7 6.5 - 8.1 g/dL   Albumin 3.8 3.5 - 5.0 g/dL   AST 20 15 - 41 U/L   ALT 20 17 - 63 U/L   Alkaline Phosphatase 71 38 - 126 U/L   Total Bilirubin 1.0 0.3 - 1.2 mg/dL   GFR calc non Af Amer >60 >60 mL/min   GFR calc Af Amer >60 >60 mL/min    Comment: (NOTE) The eGFR has been calculated using the CKD EPI equation. This calculation has not been validated in all clinical situations. eGFR's persistently <60 mL/min signify possible Chronic Kidney Disease.    Anion gap 8 5 - 15  Protime-INR     Status: None   Collection Time: 10/08/16  6:47 PM  Result Value Ref Range   Prothrombin Time 13.3 11.4 - 15.2 seconds   INR 1.01   Lactic acid, plasma     Status: None   Collection Time: 10/08/16  6:47 PM  Result Value Ref Range   Lactic Acid, Venous 1.1 0.5 - 1.9 mmol/L  Hemoglobin  A1c     Status: Abnormal   Collection Time: 10/08/16  6:47 PM  Result Value Ref Range   Hgb A1c MFr Bld 6.3 (H) 4.8 - 5.6 %    Comment: (NOTE)         Pre-diabetes: 5.7 - 6.4         Diabetes: >6.4         Glycemic control for adults with diabetes: <7.0    Mean Plasma Glucose 134 mg/dL    Comment: (NOTE) Performed At: [facility] [place], [place] [id number] Lindon Romp MD GY:[id number]   Urinalysis, Routine w reflex microscopic     Status: None   Collection Time: 10/08/16  8:26 PM  Result Value Ref Range   Color, Urine YELLOW YELLOW   APPearance CLEAR CLEAR   Specific Gravity, Urine 1.025 1.005 - 1.030   pH 6.0 5.0 - 8.0   Glucose, UA NEGATIVE NEGATIVE mg/dL   Hgb urine dipstick NEGATIVE NEGATIVE   Bilirubin Urine NEGATIVE NEGATIVE   Ketones, ur NEGATIVE NEGATIVE mg/dL   Protein, ur NEGATIVE NEGATIVE mg/dL   Nitrite NEGATIVE NEGATIVE   Leukocytes, UA NEGATIVE NEGATIVE    Comment: MICROSCOPIC NOT DONE ON URINES WITH NEGATIVE PROTEIN, BLOOD, LEUKOCYTES, NITRITE, OR GLUCOSE  <1000 mg/dL.  MRSA PCR Screening     Status: None   Collection Time: 10/08/16 10:26 PM  Result Value Ref Range   MRSA by PCR NEGATIVE NEGATIVE    Comment:        The GeneXpert MRSA Assay (FDA approved for NASAL specimens only), is one component of a comprehensive MRSA colonization surveillance program. It is not intended to diagnose MRSA infection nor to guide or monitor treatment for MRSA infections.   Troponin I (q 6hr x 3)     Status: None   Collection Time: 10/08/16 10:47 PM  Result Value Ref Range   Troponin I <0.03 <0.03 ng/mL  Glucose, capillary     Status: Abnormal   Collection Time: 10/09/16 12:41 AM  Result Value Ref Range   Glucose-Capillary 139 (H) 65 - 99 mg/dL   Comment 1 Notify RN   CBC     Status: Abnormal   Collection Time: 10/09/16  4:27 AM  Result Value Ref Range   WBC 8.4 4.0 - 10.5 K/uL   RBC 3.86 (L) 4.22 - 5.81 MIL/uL   Hemoglobin 12.4 (L) 13.0 - 17.0 g/dL   HCT 37.6 (L) 39.0 - 52.0 %   MCV 97.4 78.0 - 100.0 fL   MCH 32.1 26.0 - 34.0 pg   MCHC 33.0 30.0 - 36.0 g/dL   RDW 15.2 11.5 - 15.5 %   Platelets 156 150 - 400 K/uL  Comprehensive metabolic panel     Status: Abnormal   Collection Time: 10/09/16  4:27 AM  Result Value Ref Range   Sodium 134 (L) 135 - 145 mmol/L   Potassium 3.8 3.5 - 5.1 mmol/L   Chloride 98 (L) 101 - 111 mmol/L   CO2 29 22 - 32 mmol/L   Glucose, Bld 166 (H) 65 - 99 mg/dL   BUN 24 (H) 6 - 20 mg/dL   Creatinine, Ser 0.66 0.61 - 1.24 mg/dL   Calcium 8.0 (L) 8.9 - 10.3 mg/dL   Total Protein 6.0 (L) 6.5 - 8.1 g/dL   Albumin 3.4 (L) 3.5 - 5.0 g/dL   AST 19 15 - 41 U/L   ALT 18 17 - 63 U/L   Alkaline Phosphatase 64 38 -  126 U/L   Total Bilirubin 0.9 0.3 - 1.2 mg/dL   GFR calc non Af Amer >60 >60 mL/min   GFR calc Af Amer >60 >60 mL/min    Comment: (NOTE) The eGFR has been calculated using the CKD EPI equation. This calculation has not been validated in all clinical situations. eGFR's persistently <60 mL/min signify  possible Chronic Kidney Disease.    Anion gap 7 5 - 15  Troponin I (q 6hr x 3)     Status: None   Collection Time: 10/09/16  4:27 AM  Result Value Ref Range   Troponin I <0.03 <0.03 ng/mL  Glucose, capillary     Status: Abnormal   Collection Time: 10/09/16  4:34 AM  Result Value Ref Range   Glucose-Capillary 151 (H) 65 - 99 mg/dL   Comment 1 Notify RN   Glucose, capillary     Status: Abnormal   Collection Time: 10/09/16  7:37 AM  Result Value Ref Range   Glucose-Capillary 160 (H) 65 - 99 mg/dL  Troponin I (q 6hr x 3)     Status: None   Collection Time: 10/09/16 10:43 AM  Result Value Ref Range   Troponin I <0.03 <0.03 ng/mL  Glucose, capillary     Status: Abnormal   Collection Time: 10/09/16 11:25 AM  Result Value Ref Range   Glucose-Capillary 174 (H) 65 - 99 mg/dL  Glucose, capillary     Status: Abnormal   Collection Time: 10/09/16  5:06 PM  Result Value Ref Range   Glucose-Capillary 152 (H) 65 - 99 mg/dL   Comment 1 Notify RN    Comment 2 Document in Chart   Glucose, capillary     Status: Abnormal   Collection Time: 10/09/16  9:34 PM  Result Value Ref Range   Glucose-Capillary 124 (H) 65 - 99 mg/dL   Comment 1 Notify RN    Comment 2 Document in Chart   Glucose, capillary     Status: Abnormal   Collection Time: 10/10/16 12:25 AM  Result Value Ref Range   Glucose-Capillary 144 (H) 65 - 99 mg/dL  Glucose, capillary     Status: Abnormal   Collection Time: 10/10/16  4:03 AM  Result Value Ref Range   Glucose-Capillary 148 (H) 65 - 99 mg/dL   Comment 1 Notify RN    Comment 2 Document in Chart   Glucose, capillary     Status: Abnormal   Collection Time: 10/10/16  8:30 AM  Result Value Ref Range   Glucose-Capillary 148 (H) 65 - 99 mg/dL    ABGS No results for input(s): PHART, PO2ART, TCO2, HCO3 in the last 72 hours.  Invalid input(s): PCO2 CULTURES Recent Results (from the past 240 hour(s))  MRSA PCR Screening     Status: None   Collection Time: 10/08/16 10:26 PM   Result Value Ref Range Status   MRSA by PCR NEGATIVE NEGATIVE Final    Comment:        The GeneXpert MRSA Assay (FDA approved for NASAL specimens only), is one component of a comprehensive MRSA colonization surveillance program. It is not intended to diagnose MRSA infection nor to guide or monitor treatment for MRSA infections.    Studies/Results: Dg Chest 2 View  Result Date: 10/08/2016 CLINICAL DATA:  Chest pain, wheezing, nausea, and vomiting. Remote history of tuberculosis. History of chronic bronchitis. EXAM: CHEST  2 VIEW COMPARISON:  11/20/2015 FINDINGS: Cardiomediastinal silhouette is unchanged, with the heart upper limits of normal in size. Aortic atherosclerosis is  again noted. Peribronchial thickening is similar to the prior study. Mild bibasilar airspace opacities are similar to the prior study and may reflect atelectasis. No definite acute airspace consolidation, overt edema, pleural effusion, or pneumothorax is identified. No acute osseous abnormality is seen. IMPRESSION: 1. Chronic lung disease/bronchitic changes without evidence of acute abnormality. 2. Aortic atherosclerosis. Electronically Signed   By: Logan Bores M.D.   On: 10/08/2016 19:33   Dg Abd 2 Views  Result Date: 10/09/2016 CLINICAL DATA:  Vomiting for several days EXAM: ABDOMEN - 2 VIEW COMPARISON:  10/08/2016 FINDINGS: No dilated loops of large or small bowel. There is gas within nondistended small bowel and colon. Gas within the rectum. No pathologic calcifications. No organomegaly. IMPRESSION: No evidence of bowel obstruction. Electronically Signed   By: Suzy Bouchard M.D.   On: 10/09/2016 10:37   Dg Abd 2 Views  Result Date: 10/08/2016 CLINICAL DATA:  Wheezing, nausea vomiting chest pain EXAM: ABDOMEN - 2 VIEW COMPARISON:  11/28/2015 FINDINGS: Small amount of right pleural effusion or thickening. Mild bibasilar atelectasis or scarring. No gross free air beneath the diaphragm. Surgical clips in the  right upper quadrant. Mild gas-filled loops of central small bowel with scattered colonic bowel gas. Scattered fluid levels on upright exam suggest mild ileus. Calcified phleboliths in the right pelvis. Degenerative changes of the bilateral hips and spine. No pathologic calcifications. IMPRESSION: 1. Mild increased central small bowel gas with scattered fluid levels; there is gas present in the colon, findings could relate to a mild ileus. 2. Small amount of right pleural effusion or thickening. Bibasilar atelectasis or scarring. Electronically Signed   By: Donavan Foil M.D.   On: 10/08/2016 19:33    Medications:  Prior to Admission:  Prescriptions Prior to Admission  Medication Sig Dispense Refill Last Dose  . allopurinol (ZYLOPRIM) 300 MG tablet Take 300 mg by mouth daily as needed (for gout).   unknown  . Ascorbic Acid (VITAMIN C) 500 MG tablet Take 500 mg by mouth 2 (two) times daily.    10/08/2016 at Unknown time  . cholecalciferol (VITAMIN D) 1000 UNITS tablet Take 4,000 Units by mouth daily.   10/08/2016 at Unknown time  . diltiazem (CARDIZEM CD) 120 MG 24 hr capsule Take 120 mg by mouth daily.   10/08/2016 at Unknown time  . docusate sodium (COLACE) 100 MG capsule Take 300 mg by mouth 2 (two) times daily.   10/08/2016 at Unknown time  . ferrous sulfate 325 (65 FE) MG tablet Take 325 mg by mouth 2 (two) times daily with a meal.   10/08/2016 at Unknown time  . finasteride (PROSCAR) 5 MG tablet Take 5 mg by mouth daily.     10/08/2016 at Unknown time  . folic acid (FOLVITE) 1 MG tablet Take 1 mg by mouth daily.   10/08/2016 at Unknown time  . galantamine (RAZADYNE) 8 MG tablet Take 8 mg by mouth 2 (two) times daily.     10/08/2016 at Unknown time  . guaiFENesin 200 MG tablet Take 400 mg by mouth 2 (two) times daily.   10/08/2016 at Unknown time  . Insulin Glargine (LANTUS SOLOSTAR) 100 UNIT/ML Solostar Pen Inject 24 Units into the skin every evening.    10/07/2016 at Unknown time  .  levothyroxine (SYNTHROID, LEVOTHROID) 150 MCG tablet Take 150 mcg by mouth daily before breakfast.   10/08/2016 at Unknown time  . lisinopril (PRINIVIL,ZESTRIL) 20 MG tablet Take 10 mg by mouth daily.    10/08/2016 at Unknown time  .  loratadine (CLARITIN) 10 MG tablet Take 10 mg by mouth daily.     10/08/2016 at Unknown time  . magnesium oxide (MAG-OX) 400 MG tablet Take 400 mg by mouth 2 (two) times daily.    10/08/2016 at Unknown time  . meclizine (ANTIVERT) 25 MG tablet Take 25 mg by mouth 4 (four) times daily as needed for dizziness or nausea.   unknown  . metFORMIN (GLUCOPHAGE) 1000 MG tablet Take 1,000 mg by mouth 2 (two) times daily with a meal.   10/08/2016 at Unknown time  . montelukast (SINGULAIR) 10 MG tablet Take 10 mg by mouth at bedtime.   10/07/2016 at Unknown time  . Multiple Vitamin (MULTIVITAMIN WITH MINERALS) TABS tablet Take 1 tablet by mouth daily.   10/08/2016 at Unknown time  . ondansetron (ZOFRAN) 4 MG tablet Take 4 mg by mouth every 8 (eight) hours as needed for nausea or vomiting.   unknown  . oxybutynin (DITROPAN) 5 MG tablet Take 5 mg by mouth 2 (two) times daily.    10/08/2016 at Unknown time  . pantoprazole (PROTONIX) 40 MG tablet Take 40 mg by mouth 2 (two) times daily.   10/08/2016 at Unknown time  . rosuvastatin (CRESTOR) 40 MG tablet Take 20 mg by mouth at bedtime.    10/07/2016 at Unknown time  . tamsulosin (FLOMAX) 0.4 MG CAPS capsule Take 0.4 mg by mouth daily.   10/08/2016 at Unknown time   Scheduled: . diltiazem  120 mg Oral Daily  . enoxaparin (LOVENOX) injection  40 mg Subcutaneous Q24H  . ferrous sulfate  325 mg Oral BID WC  . finasteride  5 mg Oral Daily  . folic acid  1 mg Oral Daily  . galantamine  8 mg Oral BID  . insulin aspart  0-9 Units Subcutaneous TID WC  . levothyroxine  150 mcg Oral QAC breakfast  . lisinopril  10 mg Oral Daily  . loratadine  10 mg Oral Daily  . montelukast  10 mg Oral QHS  . pantoprazole  40 mg Oral BID  . tamsulosin   0.4 mg Oral Daily   Continuous: . sodium chloride 100 mL/hr at 10/10/16 0502   HGD:[id number] **OR** acetaminophen, allopurinol, meclizine, metoprolol, ondansetron  Assesment: He was admitted with nausea and vomiting. His x-ray showed ileus but he has improved. However he has only had liquids so far. He has multiple other medical problems including chronic atrial fib which is stable. He has coronary artery occlusive disease which is also stable. COPD with mostly chronic bronchitis stable Active Problems:   Atrial fibrillation (HCC)   Nausea and vomiting   Ileus ([place])   Chest pain    Plan: See if we can get him to eat solid food if he can do that without nausea or vomiting he can go home later today    LOS: 2 days   Akiel Fennell L 10/10/2016, 9:01 AM

## 2016-10-10 NOTE — Discharge Instructions (Signed)
Nausea and Vomiting  Nausea means you feel sick to your stomach. Throwing up (vomiting) is a reflex where stomach contents come out of your mouth.  HOME CARE   · Take medicine as told by your doctor.  · Do not force yourself to eat. However, you do need to drink fluids.  · If you feel like eating, eat a normal diet as told by your doctor.    Eat rice, wheat, potatoes, bread, lean meats, yogurt, fruits, and vegetables.    Avoid high-fat foods.  · Drink enough fluids to keep your pee (urine) clear or pale yellow.  · Ask your doctor how to replace body fluid losses (rehydrate). Signs of body fluid loss (dehydration) include:    Feeling very thirsty.    Dry lips and mouth.    Feeling dizzy.    Dark pee.    Peeing less than normal.    Feeling confused.    Fast breathing or heart rate.  GET HELP RIGHT AWAY IF:   · You have blood in your throw up.  · You have black or bloody poop (stool).  · You have a bad headache or stiff neck.  · You feel confused.  · You have bad belly (abdominal) pain.  · You have chest pain or trouble breathing.  · You do not pee at least once every 8 hours.  · You have cold, clammy skin.  · You keep throwing up after 24 to 48 hours.  · You have a fever.  MAKE SURE YOU:   · Understand these instructions.  · Will watch your condition.  · Will get help right away if you are not doing well or get worse.     This information is not intended to replace advice given to you by your health care provider. Make sure you discuss any questions you have with your health care provider.     Document Released: 05/21/2008 Document Revised: 02/25/2012 Document Reviewed: 05/04/2011  Elsevier Interactive Patient Education ©2016 [employer]

## 2016-10-10 NOTE — Progress Notes (Signed)
Pt had breakfast and lunch. No N/V. MD notified and said pt will be discharged. Awaiting order. Oswald Hillock, RN

## 2016-10-10 NOTE — Progress Notes (Signed)
Discharged PT per MD order and protocol. Discharge handouts reviewed/explained. Education completed.  Pt verbalized understanding and left with all belongings. VSS. IV catheter D/C.  Patient wheeled down by staff member. Oswald Hillock, RN

## 2016-10-11 NOTE — Discharge Summary (Signed)
Physician Discharge Summary  Patient ID: Caleb [name] MRN: [id number] DOB/AGE: [birth year] 81 y.o. Primary Care Physician:Briawna Carver L, MD Admit date: 10/08/2016 Discharge date: 10/11/2016    Discharge Diagnoses:   Active Problems:   Essential hypertension, benign   Coronary atherosclerosis   Atrial fibrillation (HCC)   COPD (chronic obstructive pulmonary disease) (HCC)   Diabetes mellitus without complication (HCC)   Nausea and vomiting   Ileus (HCC)   Chest pain Chronic atrial fibrillation    Medication List    TAKE these medications   allopurinol 300 MG tablet Commonly known as:  ZYLOPRIM Take 300 mg by mouth daily as needed (for gout).   cholecalciferol 1000 units tablet Commonly known as:  VITAMIN D Take 4,000 Units by mouth daily.   diltiazem 120 MG 24 hr capsule Commonly known as:  CARDIZEM CD Take 120 mg by mouth daily.   docusate sodium 100 MG capsule Commonly known as:  COLACE Take 300 mg by mouth 2 (two) times daily.   ferrous sulfate 325 (65 FE) MG tablet Take 325 mg by mouth 2 (two) times daily with a meal.   finasteride 5 MG tablet Commonly known as:  PROSCAR Take 5 mg by mouth daily.   folic acid 1 MG tablet Commonly known as:  FOLVITE Take 1 mg by mouth daily.   galantamine 8 MG tablet Commonly known as:  RAZADYNE Take 8 mg by mouth 2 (two) times daily.   guaiFENesin 200 MG tablet Take 400 mg by mouth 2 (two) times daily.   LANTUS SOLOSTAR 100 UNIT/ML Solostar Pen Generic drug:  Insulin Glargine Inject 24 Units into the skin every evening.   levothyroxine 150 MCG tablet Commonly known as:  SYNTHROID, LEVOTHROID Take 150 mcg by mouth daily before breakfast.   lisinopril 20 MG tablet Commonly known as:  PRINIVIL,ZESTRIL Take 10 mg by mouth daily.   loratadine 10 MG tablet Commonly known as:  CLARITIN Take 10 mg by mouth daily.   magnesium oxide 400 MG tablet Commonly known as:  MAG-OX Take 400 mg by mouth 2 (two) times  daily.   meclizine 25 MG tablet Commonly known as:  ANTIVERT Take 25 mg by mouth 4 (four) times daily as needed for dizziness or nausea.   metFORMIN 1000 MG tablet Commonly known as:  GLUCOPHAGE Take 1,000 mg by mouth 2 (two) times daily with a meal.   montelukast 10 MG tablet Commonly known as:  SINGULAIR Take 10 mg by mouth at bedtime.   multivitamin with minerals Tabs tablet Take 1 tablet by mouth daily.   ondansetron 4 MG tablet Commonly known as:  ZOFRAN Take 4 mg by mouth every 8 (eight) hours as needed for nausea or vomiting.   oxybutynin 5 MG tablet Commonly known as:  DITROPAN Take 5 mg by mouth 2 (two) times daily.   pantoprazole 40 MG tablet Commonly known as:  PROTONIX Take 40 mg by mouth 2 (two) times daily.   rosuvastatin 40 MG tablet Commonly known as:  CRESTOR Take 20 mg by mouth at bedtime.   tamsulosin 0.4 MG Caps capsule Commonly known as:  FLOMAX Take 0.4 mg by mouth daily.   vitamin C 500 MG tablet Commonly known as:  ASCORBIC ACID Take 500 mg by mouth 2 (two) times daily.       Discharged Condition:Improved    Consults: None  Significant Diagnostic Studies: Dg Chest 2 View  Result Date: 10/08/2016 CLINICAL DATA:  Chest pain, wheezing, nausea, and vomiting. Remote history of tuberculosis.  History of chronic bronchitis. EXAM: CHEST  2 VIEW COMPARISON:  11/20/2015 FINDINGS: Cardiomediastinal silhouette is unchanged, with the heart upper limits of normal in size. Aortic atherosclerosis is again noted. Peribronchial thickening is similar to the prior study. Mild bibasilar airspace opacities are similar to the prior study and may reflect atelectasis. No definite acute airspace consolidation, overt edema, pleural effusion, or pneumothorax is identified. No acute osseous abnormality is seen. IMPRESSION: 1. Chronic lung disease/bronchitic changes without evidence of acute abnormality. 2. Aortic atherosclerosis. Electronically Signed   By: Logan Bores M.D.   On: 10/08/2016 19:33   Dg Abd 2 Views  Result Date: 10/09/2016 CLINICAL DATA:  Vomiting for several days EXAM: ABDOMEN - 2 VIEW COMPARISON:  10/08/2016 FINDINGS: No dilated loops of large or small bowel. There is gas within nondistended small bowel and colon. Gas within the rectum. No pathologic calcifications. No organomegaly. IMPRESSION: No evidence of bowel obstruction. Electronically Signed   By: Suzy Bouchard M.D.   On: 10/09/2016 10:37   Dg Abd 2 Views  Result Date: 10/08/2016 CLINICAL DATA:  Wheezing, nausea vomiting chest pain EXAM: ABDOMEN - 2 VIEW COMPARISON:  11/28/2015 FINDINGS: Small amount of right pleural effusion or thickening. Mild bibasilar atelectasis or scarring. No gross free air beneath the diaphragm. Surgical clips in the right upper quadrant. Mild gas-filled loops of central small bowel with scattered colonic bowel gas. Scattered fluid levels on upright exam suggest mild ileus. Calcified phleboliths in the right pelvis. Degenerative changes of the bilateral hips and spine. No pathologic calcifications. IMPRESSION: 1. Mild increased central small bowel gas with scattered fluid levels; there is gas present in the colon, findings could relate to a mild ileus. 2. Small amount of right pleural effusion or thickening. Bibasilar atelectasis or scarring. Electronically Signed   By: Donavan Foil M.D.   On: 10/08/2016 19:33    Lab Results: Basic Metabolic Panel:  Recent Labs  10/08/16 1847 10/09/16 0427  NA 133* 134*  K 3.6 3.8  CL 97* 98*  CO2 28 29  GLUCOSE 132* 166*  BUN 24* 24*  CREATININE 0.69 0.66  CALCIUM 8.6* 8.0*   Liver Function Tests:  Recent Labs  10/08/16 1847 10/09/16 0427  AST 20 19  ALT 20 18  ALKPHOS 71 64  BILITOT 1.0 0.9  PROT 6.7 6.0*  ALBUMIN 3.8 3.4*     CBC:  Recent Labs  10/08/16 1847 10/09/16 0427  WBC 10.0 8.4  HGB 13.7 12.4*  HCT 41.5 37.6*  MCV 96.1 97.4  PLT 183 156    Recent Results (from the past  240 hour(s))  Urine culture     Status: None   Collection Time: 10/08/16  8:26 PM  Result Value Ref Range Status   Specimen Description URINE, CLEAN CATCH  Final   Special Requests NONE  Final   Culture NO GROWTH Performed at [facility]   Final   Report Status 10/10/2016 FINAL  Final  MRSA PCR Screening     Status: None   Collection Time: 10/08/16 10:26 PM  Result Value Ref Range Status   MRSA by PCR NEGATIVE NEGATIVE Final    Comment:        The GeneXpert MRSA Assay (FDA approved for NASAL specimens only), is one component of a comprehensive MRSA colonization surveillance program. It is not intended to diagnose MRSA infection nor to guide or monitor treatment for MRSA infections.      Hospital Course: This is a 81 year old who came to  the hospital because of abdominal discomfort and nausea and vomiting. He had some chest pain but this was felt to be GI in origin. He was treated with IV fluids given anti-emetics and improved rapidly. He was able to eat and was back at his normal state by the time of discharge.  Discharge Exam: Blood pressure 138/80, pulse 72, temperature 98.2 F (36.8 C), temperature source Oral, resp. rate 20, height '5\' 9"'$  (1.753 m), weight 87.6 kg (193 lb 2 oz), SpO2 92 %. He's awake and alert. He has chronic atrial fib. His abdomen is soft.  Disposition: Home he will follow-up in my office    Follow-up [place], MD. Schedule an appointment as soon as possible for a visit in 1 week(s).   Specialty:  Pulmonary Disease Why:  make appt within one week for followup  Contact information: [address] [place] [id number] [phone]           Signed: Graham Hyun L   10/11/2016, 8:50 AM

## 2016-12-19 IMAGING — DX DG ABDOMEN 2V
3 series · 3 of 3 positions shown · non-contrast
Comparison: 10/08/2016

CLINICAL DATA: Vomiting for several days

EXAM:
ABDOMEN - 2 VIEW

[abdomen erect]
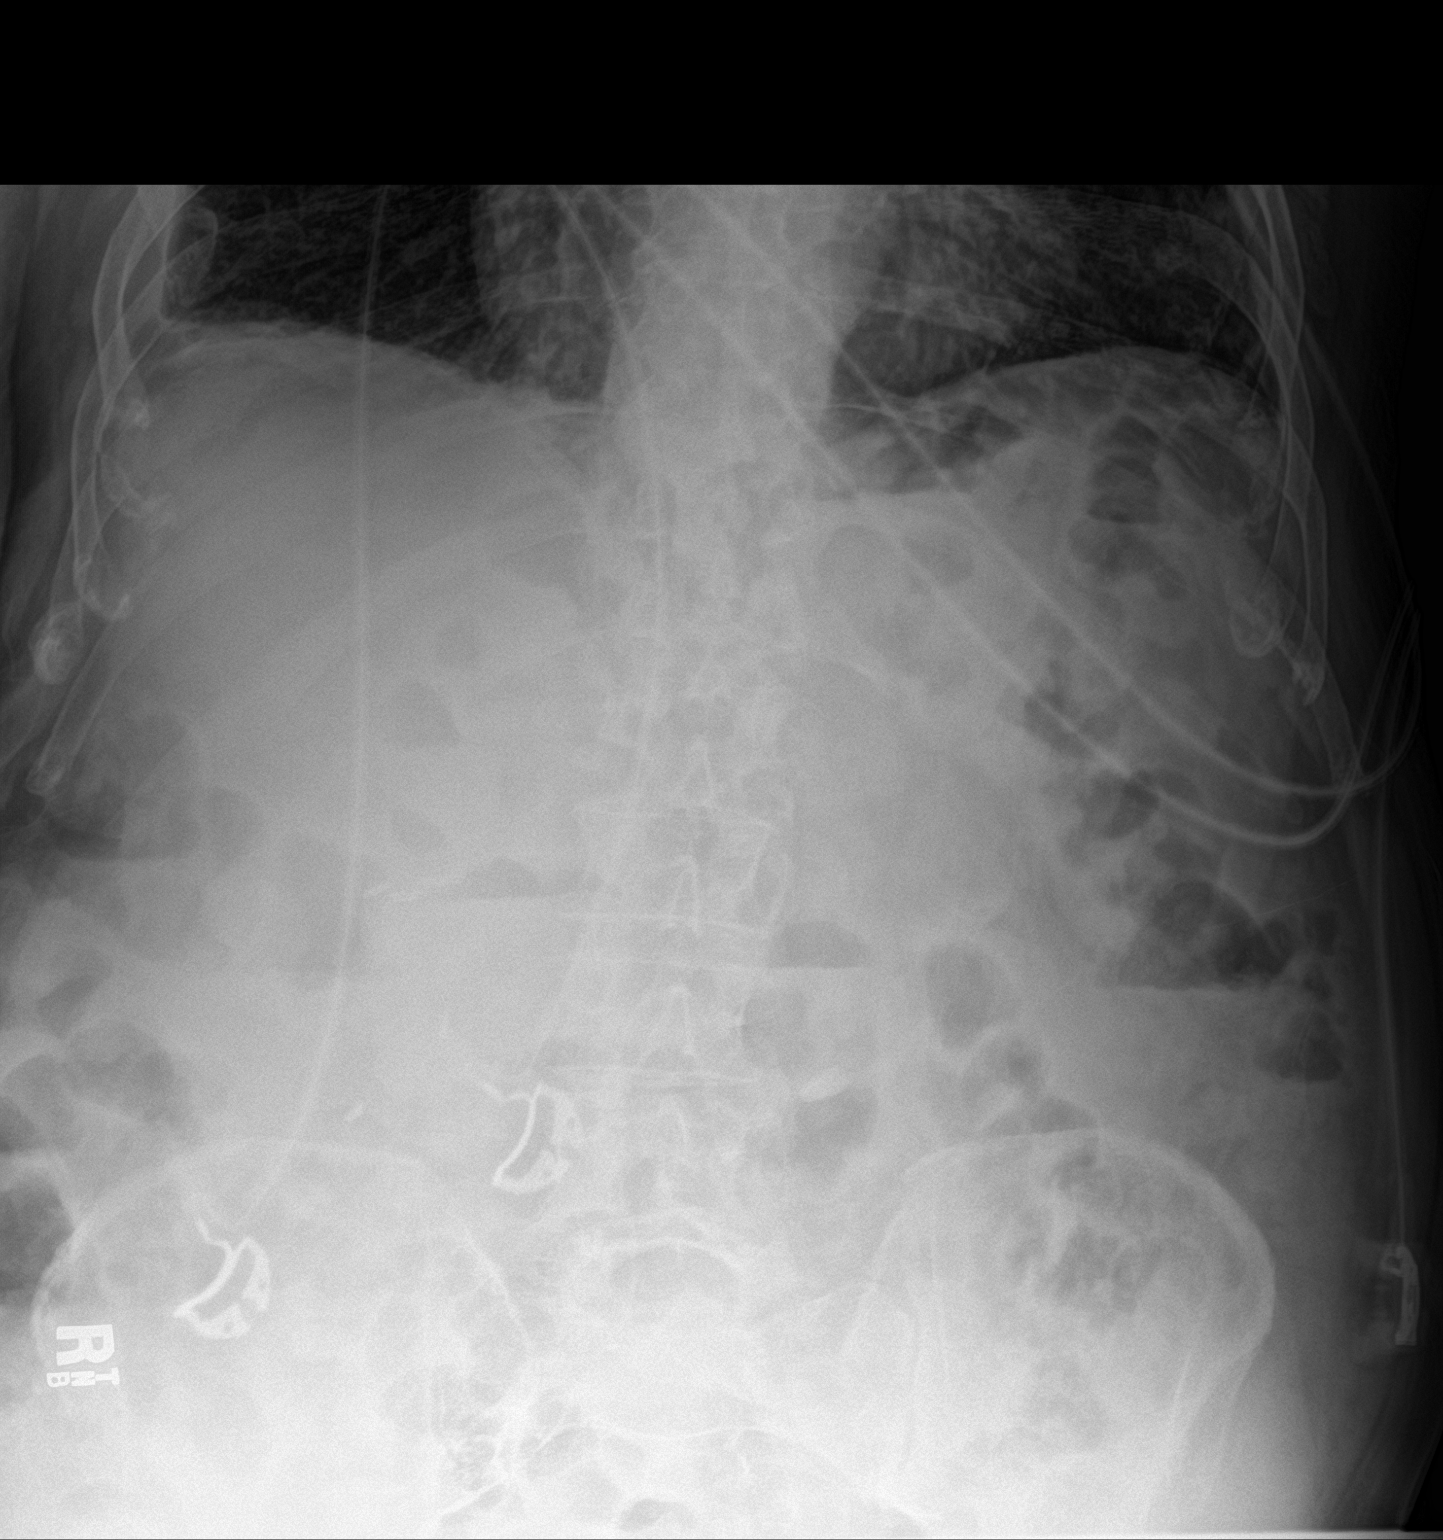

[abdomen supine (1 of 2)]
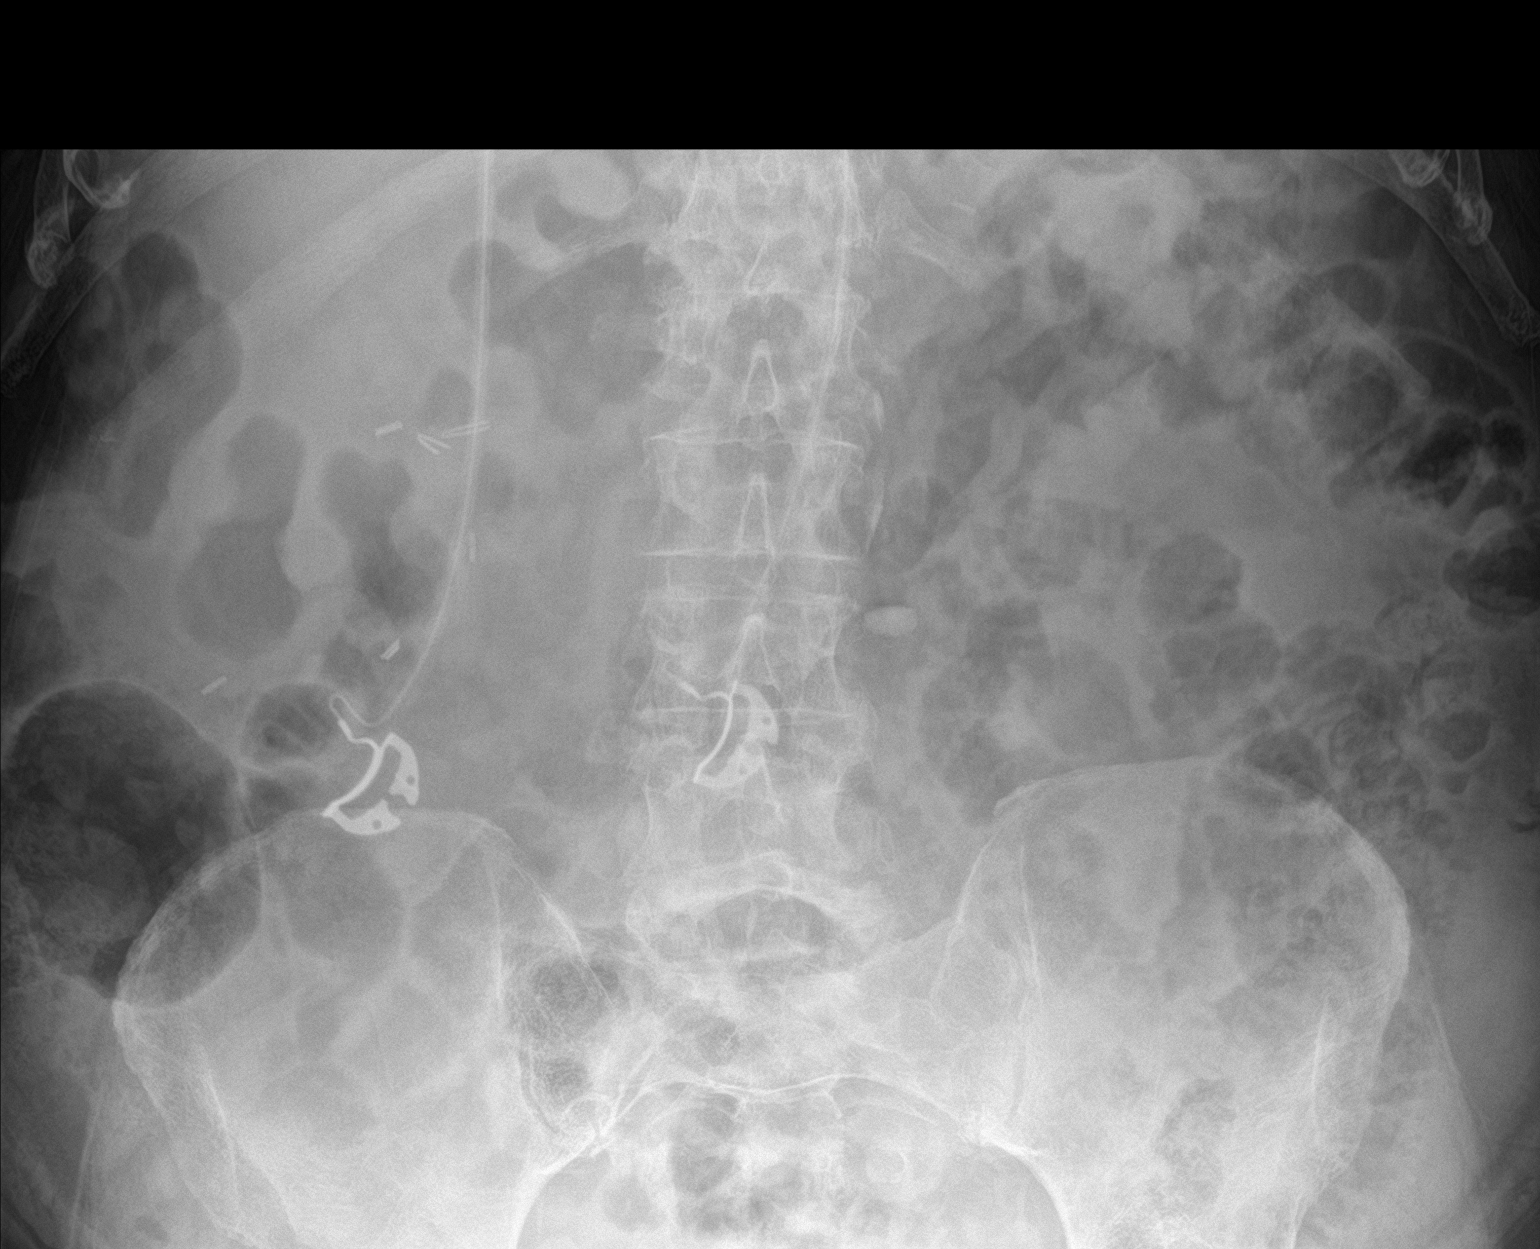

[abdomen supine (2 of 2)]
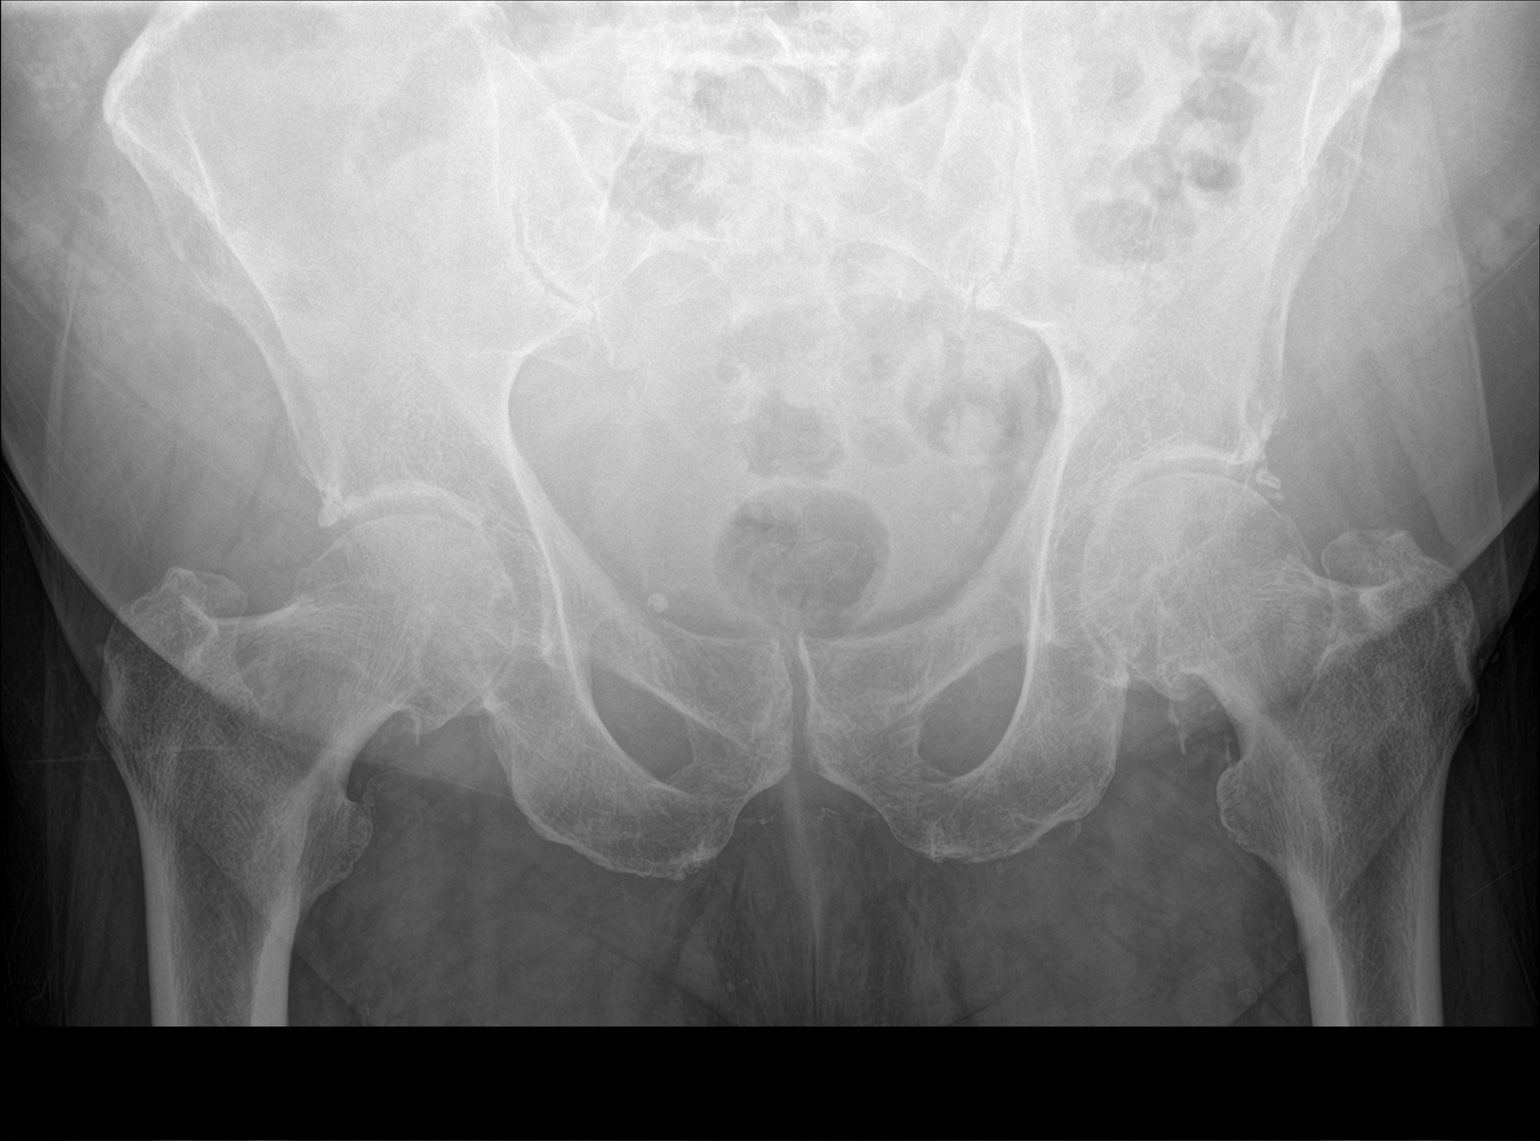

[3 of 3 positions shown; findings below may reference images not displayed]

FINDINGS: No dilated loops of large or small bowel. There is gas within
nondistended small bowel and colon. Gas within the rectum. No
pathologic calcifications. No organomegaly.
IMPRESSION: No evidence of bowel obstruction.

## 2016-12-20 DIAGNOSIS — J449 Chronic obstructive pulmonary disease, unspecified: Secondary | ICD-10-CM | POA: Diagnosis not present

## 2016-12-20 DIAGNOSIS — I10 Essential (primary) hypertension: Secondary | ICD-10-CM | POA: Diagnosis not present

## 2016-12-20 DIAGNOSIS — I4891 Unspecified atrial fibrillation: Secondary | ICD-10-CM | POA: Diagnosis not present

## 2016-12-20 DIAGNOSIS — I25119 Atherosclerotic heart disease of native coronary artery with unspecified angina pectoris: Secondary | ICD-10-CM | POA: Diagnosis not present

## 2017-01-31 DIAGNOSIS — I25119 Atherosclerotic heart disease of native coronary artery with unspecified angina pectoris: Secondary | ICD-10-CM | POA: Diagnosis not present

## 2017-01-31 DIAGNOSIS — I10 Essential (primary) hypertension: Secondary | ICD-10-CM | POA: Diagnosis not present

## 2017-01-31 DIAGNOSIS — I4891 Unspecified atrial fibrillation: Secondary | ICD-10-CM | POA: Diagnosis not present

## 2017-01-31 DIAGNOSIS — J449 Chronic obstructive pulmonary disease, unspecified: Secondary | ICD-10-CM | POA: Diagnosis not present

## 2017-04-02 DIAGNOSIS — J441 Chronic obstructive pulmonary disease with (acute) exacerbation: Secondary | ICD-10-CM | POA: Diagnosis not present

## 2017-04-02 DIAGNOSIS — I25119 Atherosclerotic heart disease of native coronary artery with unspecified angina pectoris: Secondary | ICD-10-CM | POA: Diagnosis not present

## 2017-04-02 DIAGNOSIS — I4891 Unspecified atrial fibrillation: Secondary | ICD-10-CM | POA: Diagnosis not present

## 2017-04-02 DIAGNOSIS — I10 Essential (primary) hypertension: Secondary | ICD-10-CM | POA: Diagnosis not present

## 2017-04-17 DIAGNOSIS — J449 Chronic obstructive pulmonary disease, unspecified: Secondary | ICD-10-CM | POA: Diagnosis not present

## 2017-04-17 DIAGNOSIS — I25119 Atherosclerotic heart disease of native coronary artery with unspecified angina pectoris: Secondary | ICD-10-CM | POA: Diagnosis not present

## 2017-04-17 DIAGNOSIS — I10 Essential (primary) hypertension: Secondary | ICD-10-CM | POA: Diagnosis not present

## 2017-04-17 DIAGNOSIS — I4891 Unspecified atrial fibrillation: Secondary | ICD-10-CM | POA: Diagnosis not present

## 2017-06-03 DIAGNOSIS — J449 Chronic obstructive pulmonary disease, unspecified: Secondary | ICD-10-CM | POA: Diagnosis not present

## 2017-06-03 DIAGNOSIS — I4891 Unspecified atrial fibrillation: Secondary | ICD-10-CM | POA: Diagnosis not present

## 2017-06-03 DIAGNOSIS — I10 Essential (primary) hypertension: Secondary | ICD-10-CM | POA: Diagnosis not present

## 2017-06-03 DIAGNOSIS — I25119 Atherosclerotic heart disease of native coronary artery with unspecified angina pectoris: Secondary | ICD-10-CM | POA: Diagnosis not present

## 2017-07-01 DIAGNOSIS — I10 Essential (primary) hypertension: Secondary | ICD-10-CM | POA: Diagnosis not present

## 2017-07-01 DIAGNOSIS — K219 Gastro-esophageal reflux disease without esophagitis: Secondary | ICD-10-CM | POA: Diagnosis not present

## 2017-07-01 DIAGNOSIS — I25119 Atherosclerotic heart disease of native coronary artery with unspecified angina pectoris: Secondary | ICD-10-CM | POA: Diagnosis not present

## 2017-07-01 DIAGNOSIS — J441 Chronic obstructive pulmonary disease with (acute) exacerbation: Secondary | ICD-10-CM | POA: Diagnosis not present

## 2017-08-05 DIAGNOSIS — I10 Essential (primary) hypertension: Secondary | ICD-10-CM | POA: Diagnosis not present

## 2017-08-05 DIAGNOSIS — I25119 Atherosclerotic heart disease of native coronary artery with unspecified angina pectoris: Secondary | ICD-10-CM | POA: Diagnosis not present

## 2017-08-05 DIAGNOSIS — J441 Chronic obstructive pulmonary disease with (acute) exacerbation: Secondary | ICD-10-CM | POA: Diagnosis not present

## 2017-08-05 DIAGNOSIS — I4891 Unspecified atrial fibrillation: Secondary | ICD-10-CM | POA: Diagnosis not present

## 2017-10-08 DIAGNOSIS — E1165 Type 2 diabetes mellitus with hyperglycemia: Secondary | ICD-10-CM | POA: Diagnosis not present

## 2017-10-08 DIAGNOSIS — I10 Essential (primary) hypertension: Secondary | ICD-10-CM | POA: Diagnosis not present

## 2017-10-08 DIAGNOSIS — Z23 Encounter for immunization: Secondary | ICD-10-CM | POA: Diagnosis not present

## 2017-10-08 DIAGNOSIS — J449 Chronic obstructive pulmonary disease, unspecified: Secondary | ICD-10-CM | POA: Diagnosis not present

## 2017-10-08 DIAGNOSIS — I25119 Atherosclerotic heart disease of native coronary artery with unspecified angina pectoris: Secondary | ICD-10-CM | POA: Diagnosis not present

## 2017-11-12 DIAGNOSIS — J441 Chronic obstructive pulmonary disease with (acute) exacerbation: Secondary | ICD-10-CM | POA: Diagnosis not present

## 2017-11-12 DIAGNOSIS — I4891 Unspecified atrial fibrillation: Secondary | ICD-10-CM | POA: Diagnosis not present

## 2017-11-12 DIAGNOSIS — I25119 Atherosclerotic heart disease of native coronary artery with unspecified angina pectoris: Secondary | ICD-10-CM | POA: Diagnosis not present

## 2017-11-12 DIAGNOSIS — I10 Essential (primary) hypertension: Secondary | ICD-10-CM | POA: Diagnosis not present

## 2017-12-25 DIAGNOSIS — E1165 Type 2 diabetes mellitus with hyperglycemia: Secondary | ICD-10-CM | POA: Diagnosis not present

## 2017-12-25 DIAGNOSIS — I4891 Unspecified atrial fibrillation: Secondary | ICD-10-CM | POA: Diagnosis not present

## 2017-12-25 DIAGNOSIS — J441 Chronic obstructive pulmonary disease with (acute) exacerbation: Secondary | ICD-10-CM | POA: Diagnosis not present

## 2017-12-25 DIAGNOSIS — I10 Essential (primary) hypertension: Secondary | ICD-10-CM | POA: Diagnosis not present

## 2018-01-08 DIAGNOSIS — I10 Essential (primary) hypertension: Secondary | ICD-10-CM | POA: Diagnosis not present

## 2018-01-08 DIAGNOSIS — I25119 Atherosclerotic heart disease of native coronary artery with unspecified angina pectoris: Secondary | ICD-10-CM | POA: Diagnosis not present

## 2018-01-08 DIAGNOSIS — I4891 Unspecified atrial fibrillation: Secondary | ICD-10-CM | POA: Diagnosis not present

## 2018-01-08 DIAGNOSIS — J449 Chronic obstructive pulmonary disease, unspecified: Secondary | ICD-10-CM | POA: Diagnosis not present

## 2018-04-09 DIAGNOSIS — I2510 Atherosclerotic heart disease of native coronary artery without angina pectoris: Secondary | ICD-10-CM | POA: Diagnosis not present

## 2018-04-09 DIAGNOSIS — I4891 Unspecified atrial fibrillation: Secondary | ICD-10-CM | POA: Diagnosis not present

## 2018-04-09 DIAGNOSIS — J9611 Chronic respiratory failure with hypoxia: Secondary | ICD-10-CM | POA: Diagnosis not present

## 2018-04-09 DIAGNOSIS — J449 Chronic obstructive pulmonary disease, unspecified: Secondary | ICD-10-CM | POA: Diagnosis not present

## 2018-04-30 ENCOUNTER — Encounter: Payer: Self-pay | Admitting: Gastroenterology

## 2018-07-09 DIAGNOSIS — J9611 Chronic respiratory failure with hypoxia: Secondary | ICD-10-CM | POA: Diagnosis not present

## 2018-07-09 DIAGNOSIS — I25119 Atherosclerotic heart disease of native coronary artery with unspecified angina pectoris: Secondary | ICD-10-CM | POA: Diagnosis not present

## 2018-07-09 DIAGNOSIS — J449 Chronic obstructive pulmonary disease, unspecified: Secondary | ICD-10-CM | POA: Diagnosis not present

## 2018-07-09 DIAGNOSIS — I4891 Unspecified atrial fibrillation: Secondary | ICD-10-CM | POA: Diagnosis not present

## 2018-07-21 ENCOUNTER — Encounter: Payer: Self-pay | Admitting: Nurse Practitioner

## 2018-07-21 ENCOUNTER — Ambulatory Visit (INDEPENDENT_AMBULATORY_CARE_PROVIDER_SITE_OTHER): Payer: No Typology Code available for payment source | Admitting: Nurse Practitioner

## 2018-07-21 DIAGNOSIS — K219 Gastro-esophageal reflux disease without esophagitis: Secondary | ICD-10-CM | POA: Insufficient documentation

## 2018-07-21 DIAGNOSIS — R1030 Lower abdominal pain, unspecified: Secondary | ICD-10-CM | POA: Diagnosis not present

## 2018-07-21 DIAGNOSIS — K5900 Constipation, unspecified: Secondary | ICD-10-CM

## 2018-07-21 DIAGNOSIS — R109 Unspecified abdominal pain: Secondary | ICD-10-CM | POA: Insufficient documentation

## 2018-07-21 NOTE — Progress Notes (Signed)
REVIEWED-NO ADDITIONAL RECOMMENDATIONS.  Primary Care Physician:  Sinda Du, MD Primary Gastroenterologist:  Dr. Oneida Alar  Chief Complaint  Patient presents with  . Abdominal Pain    last time was issue few weeks ago. pain comes/goes    HPI:   Caleb [name] is a 83 y.o. male who presents on referral from the [place] for abdominal pain.  Reviewed substantial notes provided by the [facility] along with the referral.  The patient was experiencing lower abdominal pain with intermittent pain in the upper abdomen, nausea, bloating.  Stated he "always has blood in his stool".  History of GI bleed.  Last colonoscopy 03/2016 at the [facility] with significant diverticular disease in the ascending, descending, sigmoid colon.  Also noted internal hemorrhoids and recommended no further colonoscopies due to age and comorbidities.  Previous colonoscopy records were provided and marked for scanning.  His lower abdominal pain was described as a "dull ache".  History of hiatal hernia, cholecystectomy, umbilical and bilateral inguinal hernia repairs.  Good appetite but occasional nausea without vomiting.  Take stool softeners, approximately 6/day.  History of COPD on oxygen.  Labs reviewed that were drawn on Apr 25, 2018 which found mildly depressed hemoglobin at 12.6 in the setting of multiple comorbidities which was found to be macrocytic and normochromic, normal platelets, normal white blood cell count.  Iron levels are normal at 68, ferritin normal at 51.  CMP completed the same day found normal alkaline phosphatase, AST, ALT, bilirubin.  Also normal amylase and lipase.  The patient is somewhat of a difficult historian. Today he states he's doing ok overall. He's had to wait for 2 months for an appointment and states "I could have died before getting in here." Has lower abdominal pain, will come about every 1-2 months. Then he will have pain and associated with black bowel movements. States he's had "holes in my colon"  (likely referring to diverticula). Colonoscopy and EGD up to date in our system (2016). The pain is described as sharp/stabbing, variable in intensity. Denies N/V, hematochezia. Appetite good. Denies persistent unintentional weight loss. Has a bowel movement every other day, stools are consistent with [place] 4, some straining. Takes 6 stool softeners a day, occasionally needs a laxative as well. . Denies chest pain, dyspnea, dizziness, lightheadedness, syncope, near syncope. Denies any other upper or lower GI symptoms.  No recent abdominal imaging.  Past Medical History:  Diagnosis Date  . Acute exacerbation of chronic bronchitis ([place])   . Anemia   . BPH (benign prostatic hypertrophy)   . CAD (coronary artery disease)   . Diabetes mellitus without complication ([place])   . Diverticulitis   . Diverticulosis   . GERD (gastroesophageal reflux disease)   . GI bleed   . History of tuberculosis   . Hyperlipidemia   . Hyperthyroidism     Past Surgical History:  Procedure Laterality Date  . CHOLECYSTECTOMY    . COLONOSCOPY N/A 11/24/2015   Dr. Gala Romney: pancolonic diverticulosis, polys removed and path consistent with adenomas  . ERCP  09/2005   Dr. Laural Golden: biliary leak, ERCP with stent placement  . ERCP  11/2005   Dr. Laural Golden: ERCP with removal of biliary stent and sphincterotomy   . ESOPHAGOGASTRODUODENOSCOPY N/A 11/23/2015   Dr. Gala Romney: normal esophagus, s/p dilation. duodenal diverticulum  . GIVENS CAPSULE STUDY N/A 11/24/2015   unremarkable, however was incomplete. No obvious bleeding source  . KNEE ARTHROSCOPY    . THYROIDECTOMY      Current Outpatient Medications  Medication Sig Dispense Refill  . allopurinol (ZYLOPRIM) 300 MG tablet Take 300 mg by mouth daily as needed (for gout).    . Ascorbic Acid (VITAMIN C) 500 MG tablet Take 500 mg by mouth 2 (two) times daily.     . cholecalciferol (VITAMIN D) 1000 UNITS tablet Take 4,000 Units by mouth daily.    Marland Kitchen diltiazem (CARDIZEM CD) 120 MG  24 hr capsule Take 120 mg by mouth daily.    Marland Kitchen docusate sodium (COLACE) 100 MG capsule Take 300 mg by mouth 2 (two) times daily.    . ferrous sulfate 325 (65 FE) MG tablet Take 325 mg by mouth 2 (two) times daily with a meal.    . finasteride (PROSCAR) 5 MG tablet Take 5 mg by mouth daily.      . folic acid (FOLVITE) 1 MG tablet Take 1 mg by mouth daily.    Marland Kitchen gabapentin (NEURONTIN) 100 MG capsule Take 200 mg by mouth at bedtime.    Marland Kitchen guaiFENesin 200 MG tablet Take 400 mg by mouth 2 (two) times daily.    . Insulin Glargine (LANTUS SOLOSTAR) 100 UNIT/ML Solostar Pen Inject 24 Units into the skin every evening.     Marland Kitchen levothyroxine (SYNTHROID, LEVOTHROID) 150 MCG tablet Take 150 mcg by mouth daily before breakfast.    . loratadine (CLARITIN) 10 MG tablet Take 10 mg by mouth daily.      . magnesium oxide (MAG-OX) 400 MG tablet Take 400 mg by mouth 2 (two) times daily.     . metFORMIN (GLUCOPHAGE) 1000 MG tablet Take 1,000 mg by mouth 2 (two) times daily with a meal.    . Multiple Vitamin (MULTIVITAMIN WITH MINERALS) TABS tablet Take 1 tablet by mouth daily.    Marland Kitchen oxybutynin (DITROPAN) 5 MG tablet Take 5 mg by mouth 2 (two) times daily.     . pantoprazole (PROTONIX) 40 MG tablet Take 40 mg by mouth 2 (two) times daily.    . rosuvastatin (CRESTOR) 40 MG tablet Take 20 mg by mouth at bedtime.     Marland Kitchen galantamine (RAZADYNE) 8 MG tablet Take 8 mg by mouth 2 (two) times daily.      Marland Kitchen lisinopril (PRINIVIL,ZESTRIL) 20 MG tablet Take 10 mg by mouth daily.     . meclizine (ANTIVERT) 25 MG tablet Take 25 mg by mouth 4 (four) times daily as needed for dizziness or nausea.    . montelukast (SINGULAIR) 10 MG tablet Take 10 mg by mouth at bedtime.    . ondansetron (ZOFRAN) 4 MG tablet Take 4 mg by mouth every 8 (eight) hours as needed for nausea or vomiting.    . tamsulosin (FLOMAX) 0.4 MG CAPS capsule Take 0.4 mg by mouth daily.     No current facility-administered medications for this visit.     Allergies as of  07/21/2018 - Review Complete 07/21/2018  Allergen Reaction Noted  . Levaquin [levofloxacin in d5w]  09/02/2013    Family History  Problem Relation Age of Onset  . Heart failure Mother   . Heart attack Father   . Stomach cancer Sister        deceased in her 66s from stomach cancer  . Colon cancer Neg Hx     Social History   Socioeconomic History  . Marital status: Married    Spouse name: Not on file  . Number of children: Not on file  . Years of education: Not on file  . Highest education level: Not on file  Occupational History  . Occupation: Retired    Fish farm manager: RETIRED  Social Needs  . Financial resource strain: Not on file  . Food insecurity:    Worry: Not on file    Inability: Not on file  . Transportation needs:    Medical: Not on file    Non-medical: Not on file  Tobacco Use  . Smoking status: Former Smoker    Packs/day: 0.75    Years: 50.00    Pack years: 37.50    Types: Cigarettes    Start date: 09/16/1944    Last attempt to quit: 10/17/1998    Years since quitting: 19.7  . Smokeless tobacco: Never Used  Substance and Sexual Activity  . Alcohol use: No  . Drug use: No  . Sexual activity: Not on file  Lifestyle  . Physical activity:    Days per week: Not on file    Minutes per session: Not on file  . Stress: Not on file  Relationships  . Social connections:    Talks on phone: Not on file    Gets together: Not on file    Attends religious service: Not on file    Active member of club or organization: Not on file    Attends meetings of clubs or organizations: Not on file    Relationship status: Not on file  . Intimate partner violence:    Fear of current or ex partner: Not on file    Emotionally abused: Not on file    Physically abused: Not on file    Forced sexual activity: Not on file  Other Topics Concern  . Not on file  Social History Narrative   Married   No regular exercise   Was in [place] years   Used to  work in [employer] fund[employer] and subsequently   Has a [place] MD in [place] [address]. Royce Macadamia    Review of Systems: Complete ROS negative except as per HPI.    Physical Exam: BP 133/71   Pulse 89   Temp (!) 97.2 F (36.2 C) (Oral)   Ht 5\' 9"  (1.753 m)   Wt 199 lb 12.8 oz (90.6 kg)   BMI 29.51 kg/m  General:   Alert and oriented. Pleasant and cooperative. Well-nourished and well-developed.  Head:  Normocephalic and atraumatic. Eyes:  Without icterus, sclera clear and conjunctiva pink.  Ears:  Normal auditory acuity. Mouth:  No deformity or lesions, oral mucosa pink.  Throat/Neck:  Supple, without mass or thyromegaly. Cardiovascular:  S1, S2 present without murmurs appreciated. Extremities without clubbing or edema. Respiratory:  Clear to auscultation bilaterally. No wheezes, rales, or rhonchi. No distress.  Gastrointestinal:  +BS, soft, and non-distended. Mild lower abdominal TTP. No HSM noted. No guarding or rebound. No masses appreciated.  Rectal:  Deferred  Musculoskalatal:  Symmetrical without gross deformities. Neurologic:  Alert and oriented x4;  grossly normal neurologically. Psych:  Alert and cooperative. Normal mood and affect. Heme/Lymph/Immune: No excessive bruising noted.    07/21/2018 10:57 AM   Disclaimer: This note was dictated with voice recognition software. Similar sounding words can inadvertently be transcribed and may not be corrected upon review.

## 2018-07-21 NOTE — Patient Instructions (Signed)
1. Stop taking your stool softeners. 2. I am giving you samples of Linzess 72 mcg.  Take this once a day, on an empty stomach. 3. Call us in 1 to 2 weeks and let us know if it is working well for you. 4. Continue your other medications. 5. Return for follow-up in 2 months. 6. Call us if you have any questions or concerns. 7. Specifically, call us if you see any blood in your stools.  At [facility] we value your feedback. You may receive a survey about your visit today. Please share your experience as we strive to create trusting relationships with our patients to provide genuine, compassionate, quality care.  It was great to meet you today!  I hope you have a great summer!!

## 2018-08-01 NOTE — Assessment & Plan Note (Signed)
The patient seemed to have struggles with constipation.  This can be contributing to his abdominal pain.  He is having to take multiple Colace stool softeners daily, up to 6 a day, and occasionally with a laxative.  He still has straining.  At this point I will have him hold Colace, I have provided Linzess samples to last 1 or 2 weeks.  Request progress for 1 to 2 weeks.  Follow-up in 2 months.

## 2018-08-01 NOTE — Assessment & Plan Note (Signed)
Patient seems to have mild lower abdominal cramping pain in the setting of constipation.  His constipation does not sound like it is well controlled, as per above.  We will give him Linzess samples as per above and request a progress report 1 to 2 weeks.  I feel his abdominal pain is likely related to constipation and if we can better control this we will likely give him some benefit in his abdominal pain.  Follow-up in 2 months.

## 2018-08-04 ENCOUNTER — Telehealth: Payer: Self-pay | Admitting: Gastroenterology

## 2018-08-04 DIAGNOSIS — R1030 Lower abdominal pain, unspecified: Secondary | ICD-10-CM

## 2018-08-04 DIAGNOSIS — K5900 Constipation, unspecified: Secondary | ICD-10-CM

## 2018-08-04 NOTE — Telephone Encounter (Signed)
I called the [facility] and was told we will need to fax the prescription to [phone]. Must include the OV note , date of birth and if possible the last 4 of SS.  Please print the Rx and I will fax.

## 2018-08-04 NOTE — Telephone Encounter (Signed)
PATIENT CALLED AND SAID THE LINZESS WORKED AND WOULD [place] [facility] [place] THE PHONE NUMBER THERE IS [phone]     PATIENT NUMBER IS [phone]   PATIENT SAID HE DOES NOT USE [facility].  HE DOES USE [facility] IN [place] ON [address].        ASKED THE PATIENT WHICH PLACE HE WANTED THE SCRIPT SENT AND [facility]ID [facility] [place].

## 2018-08-04 NOTE — Progress Notes (Signed)
cc'd to pcp 

## 2018-08-05 ENCOUNTER — Telehealth: Payer: Self-pay | Admitting: Gastroenterology

## 2018-08-05 MED ORDER — LINACLOTIDE 72 MCG PO CAPS: 72.0000 ug | ORAL_CAPSULE | Freq: Every day | ORAL | 5 refills | Status: DC

## 2018-08-05 NOTE — Telephone Encounter (Signed)
Pt called to see if his prescription had been sent to the [place]. I told him that DS was working on that. Pt said he was completely out and needed prescription called into [facility] on [address].

## 2018-08-05 NOTE — Telephone Encounter (Signed)
I have faxed the Rx to [place] and the requested information. I called pt and Left Vm that it has been faxed.

## 2018-08-05 NOTE — Telephone Encounter (Signed)
Printed Rx to fax to [place].

## 2018-08-05 NOTE — Telephone Encounter (Signed)
I called and told pt that I can leave him a few samples of the Linzess 72 mcg at front and we can still fax the prescripition to the [place] as previously requested. He said that will be great.  I am leaving Linzess 72 mcg at front desk for pick up to take one capsule daily 30 min prior to breakfast. #12 given.

## 2018-08-05 NOTE — Telephone Encounter (Signed)
Noted.  Rx sent.

## 2018-08-05 NOTE — Addendum Note (Signed)
Addended by: Gordy Levan, ERIC A on: 08/05/2018 11:04 AM   Modules accepted: Orders

## 2018-09-18 DIAGNOSIS — E1165 Type 2 diabetes mellitus with hyperglycemia: Secondary | ICD-10-CM | POA: Diagnosis not present

## 2018-09-18 DIAGNOSIS — I10 Essential (primary) hypertension: Secondary | ICD-10-CM | POA: Diagnosis not present

## 2018-09-18 DIAGNOSIS — J441 Chronic obstructive pulmonary disease with (acute) exacerbation: Secondary | ICD-10-CM | POA: Diagnosis not present

## 2018-09-18 DIAGNOSIS — J9611 Chronic respiratory failure with hypoxia: Secondary | ICD-10-CM | POA: Diagnosis not present

## 2018-10-01 ENCOUNTER — Encounter: Payer: Self-pay | Admitting: Gastroenterology

## 2018-10-01 ENCOUNTER — Ambulatory Visit: Payer: Non-veteran care | Admitting: Gastroenterology

## 2018-10-01 ENCOUNTER — Ambulatory Visit (INDEPENDENT_AMBULATORY_CARE_PROVIDER_SITE_OTHER): Payer: No Typology Code available for payment source | Admitting: Gastroenterology

## 2018-10-01 DIAGNOSIS — R1319 Other dysphagia: Secondary | ICD-10-CM

## 2018-10-01 DIAGNOSIS — K921 Melena: Secondary | ICD-10-CM

## 2018-10-01 DIAGNOSIS — R1310 Dysphagia, unspecified: Secondary | ICD-10-CM | POA: Diagnosis not present

## 2018-10-01 DIAGNOSIS — K219 Gastro-esophageal reflux disease without esophagitis: Secondary | ICD-10-CM | POA: Diagnosis not present

## 2018-10-01 DIAGNOSIS — K5901 Slow transit constipation: Secondary | ICD-10-CM | POA: Diagnosis not present

## 2018-10-01 NOTE — Assessment & Plan Note (Signed)
No current evidence for GI BLEED.  CHECK CBC.

## 2018-10-01 NOTE — Patient Instructions (Addendum)
DRINK WATER TO KEEP YOUR URINE LIGHT YELLOW.  FOLLOW A HIGH FIBER DIET. AVOID ITEMS THAT CAUSE BLOATING & GAS.  CONTINUE LINZESS. Hold for diarrhea.  TO PREVENT ULCERS AND GASTRITIS DUE TO DAILY ASPIRIN USE, CONTINUE PROTONIX. TAKE 30 MINUTES PRIOR TO MEALS TWICE DAILY.  COMPLETE CBC.  FOLLOW UP IN 6 MOS.

## 2018-10-01 NOTE — Progress Notes (Signed)
Subjective:    Patient ID: Caleb [name], male    DOB: 1935-03-31, 83 y.o.   MRN: [id number]  Sinda Du, MD   HPI Sees black watery stools while TAKING IRON. MAY HAVE 2 A DAY BUT NOT EVERY DAY. SOME DAYS HAS CONSTIPATION. PAIN IN [place] WENT AWAY(ACROSS HIS MID ABDOMEN). TAKES IT PRN. WORKS IN 2-3 HOURS. MAY HAVE A NORMAL STOOL. LAST TIME HAD BLOOD COUNT CHECKED???. ALWAYS SOB BUT NO WORSE. FOOD GETS STUCKJ GOING DOWN: RARE.  PT DENIES FEVER, CHILLS, HEMATOCHEZIA, HEMATEMESIS, nausea, vomiting, melena, diarrhea, CHEST PAIN, CHANGE IN BOWEL IN HABITS, constipation,OR heartburn or indigestion.  Past Medical History:  Diagnosis Date  . Acute exacerbation of chronic bronchitis ([place])   . Anemia   . BPH (benign prostatic hypertrophy)   . CAD (coronary artery disease)   . Diabetes mellitus without complication ([place])   . Diverticulitis   . Diverticulosis   . GERD (gastroesophageal reflux disease)   . GI bleed   . History of tuberculosis   . Hyperlipidemia   . Hyperthyroidism    Past Surgical History:  Procedure Laterality Date  . CHOLECYSTECTOMY    . COLONOSCOPY N/A 11/24/2015   Dr. Gala Romney: pancolonic diverticulosis, polys removed and path consistent with adenomas  . ERCP  09/2005   Dr. Laural Golden: biliary leak, ERCP with stent placement  . ERCP  11/2005   Dr. Laural Golden: ERCP with removal of biliary stent and sphincterotomy   . ESOPHAGOGASTRODUODENOSCOPY N/A 11/23/2015   Dr. Gala Romney: normal esophagus, s/p dilation. duodenal diverticulum  . GIVENS CAPSULE STUDY N/A 11/24/2015   unremarkable, however was incomplete. No obvious bleeding source  . KNEE ARTHROSCOPY    . THYROIDECTOMY     Allergies  Allergen Reactions  . Levaquin [Levofloxacin In D5w]     SWELLING   Current Outpatient Medications  Medication Sig    . allopurinol (ZYLOPRIM) 300 MG tablet Take 300 mg by mouth daily as needed (for gout).    . Ascorbic Acid (VITAMIN C) 500 MG tablet Take 500 mg by mouth 2 (two) times  daily.     . cholecalciferol (VITAMIN D) 1000 UNITS tablet Take 4,000 Units by mouth daily.    Marland Kitchen diltiazem (CARDIZEM CD) 120 MG 24 hr capsule Take 120 mg by mouth daily.    . ferrous sulfate 325 (65 FE) MG tablet Take 325 mg by mouth 2 (two) times daily with a meal.    . finasteride (PROSCAR) 5 MG tablet Take 5 mg by mouth daily.      . folic acid (FOLVITE) 1 MG tablet Take 1 mg by mouth daily.    Marland Kitchen gabapentin (NEURONTIN) 100 MG capsule Take 200 mg by mouth at bedtime.    Marland Kitchen galantamine (RAZADYNE) 8 MG tablet Take 8 mg by mouth 2 (two) times daily.      Marland Kitchen guaiFENesin 200 MG tablet Take 400 mg by mouth 2 (two) times daily.    . Insulin Glargine (LANTUS SOLOSTAR) 100 UNIT/ML Solostar Pen Inject 24 Units into the skin every evening.     Marland Kitchen levothyroxine (SYNTHROID, LEVOTHROID) 150 MCG tablet Take 150 mcg by mouth daily before breakfast.    . linaclotide (LINZESS) 72 MCG capsule Take 1 capsule (72 mcg total) by mouth daily before breakfast.    . lisinopril (PRINIVIL,ZESTRIL) 20 MG tablet Take 10 mg by mouth daily.     Marland Kitchen loratadine (CLARITIN) 10 MG tablet Take 10 mg by mouth daily.      . magnesium oxide (MAG-OX) 400  MG tablet Take 400 mg by mouth 2 (two) times daily.     . meclizine (ANTIVERT) 25 MG tablet Take 25 mg by mouth 4 (four) times daily as needed for dizziness or nausea.    . metFORMIN (GLUCOPHAGE) 1000 MG tablet Take 1,000 mg by mouth 2 (two) times daily with a meal.    . montelukast (SINGULAIR) 10 MG tablet Take 10 mg by mouth at bedtime.    . Multiple Vitamin (MULTIVITAMIN WITH MINERALS) TABS tablet Take 1 tablet by mouth daily.    . ondansetron (ZOFRAN) 4 MG tablet Take 4 mg by mouth every 8 (eight) hours as needed for nausea or vomiting.    Marland Kitchen oxybutynin (DITROPAN) 5 MG tablet Take 5 mg by mouth 2 (two) times daily.     . pantoprazole (PROTONIX) 40 MG tablet Take 40 mg by mouth 2 (two) times daily.    . rosuvastatin (CRESTOR) 40 MG tablet Take 20 mg by mouth at bedtime.     .  tamsulosin (FLOMAX) 0.4 MG CAPS capsule Take 0.4 mg by mouth daily.    Marland Kitchen docusate sodium (COLACE) 100 MG capsule Take 300 mg by mouth 2 (two) times daily.     Review of Systems PER HPI OTHERWISE ALL SYSTEMS ARE NEGATIVE.    Objective:   Physical Exam  Constitutional: He is oriented to person, place, and time. He appears well-developed and well-nourished. No distress.  HENT:  Head: Normocephalic and atraumatic.  Mouth/Throat: Oropharynx is clear and moist. No oropharyngeal exudate.  Eyes: Pupils are equal, round, and reactive to light. No scleral icterus.  Neck: Normal range of motion. Neck supple.  Cardiovascular: Normal rate, regular rhythm and normal heart sounds.  Pulmonary/Chest: Effort normal. No respiratory distress. He has wheezes (clears with cough).  FAIR AIR MOVEMENT BILATERALLY  Abdominal: Soft. Bowel sounds are normal. He exhibits no distension. There is no tenderness.  Musculoskeletal: He exhibits no edema.  Lymphadenopathy:    He has no cervical adenopathy.  Neurological: He is alert and oriented to person, place, and time.  NO  NEW FOCAL DEFICITS  Skin:  Plaques on extensor surface bilateral upper extremities  Psychiatric: He has a normal mood and affect.  Vitals reviewed.     Assessment & Plan:

## 2018-10-01 NOTE — Assessment & Plan Note (Signed)
SYMPTOMS CONTROLLED/RESOLVED.  TO PREVENT ULCERS AND GASTRITIS DUE TO DAILY ASPIRIN USE, CONTINUE PROTONIX. TAKE 30 MINUTES PRIOR TO MEALS TWICE DAILY. FOLLOW UP IN 6 MOS.

## 2018-10-01 NOTE — Assessment & Plan Note (Signed)
SYMPTOMS FAIRLY WELL CONTROLLED.  CONTINUE TO MONITOR SYMPTOMS. 

## 2018-10-01 NOTE — Assessment & Plan Note (Signed)
SYMPTOMS FAIRLY WELL CONTROLLED.  DRINK WATER TO KEEP YOUR URINE LIGHT YELLOW. FOLLOW A HIGH FIBER DIET. AVOID ITEMS THAT CAUSE BLOATING & GAS. CONTINUE LINZESS. Hold for diarrhea. FOLLOW UP IN 6 MOS.

## 2018-10-02 DIAGNOSIS — K921 Melena: Secondary | ICD-10-CM | POA: Diagnosis not present

## 2018-10-02 LAB — CBC WITH DIFFERENTIAL/PLATELET
Basophils Absolute: 91 cells/uL (ref 0–200)
Basophils Relative: 0.6 %
EOS PCT: 1.7 %
Eosinophils Absolute: 258 cells/uL (ref 15–500)
HCT: 41 % (ref 38.5–50.0)
Hemoglobin: 14 g/dL (ref 13.2–17.1)
Lymphs Abs: 2386 cells/uL (ref 850–3900)
MCH: 32.5 pg (ref 27.0–33.0)
MCHC: 34.1 g/dL (ref 32.0–36.0)
MCV: 95.1 fL (ref 80.0–100.0)
MPV: 9.5 fL (ref 7.5–12.5)
Monocytes Relative: 6.5 %
Neutro Abs: 11476 cells/uL — ABNORMAL HIGH (ref 1500–7800)
Neutrophils Relative %: 75.5 %
PLATELETS: 225 10*3/uL (ref 140–400)
RBC: 4.31 10*6/uL (ref 4.20–5.80)
RDW: 12.9 % (ref 11.0–15.0)
TOTAL LYMPHOCYTE: 15.7 %
WBC: 15.2 10*3/uL — AB (ref 3.8–10.8)
WBCMIX: 988 {cells}/uL — AB (ref 200–950)

## 2018-10-03 ENCOUNTER — Other Ambulatory Visit: Payer: Self-pay

## 2018-10-03 DIAGNOSIS — D72829 Elevated white blood cell count, unspecified: Secondary | ICD-10-CM

## 2018-10-03 NOTE — Progress Notes (Signed)
Pt is aware and I am mailing the results and also the lab orders to repeat CBC in 2 weeks.

## 2018-10-08 ENCOUNTER — Telehealth: Payer: Self-pay | Admitting: Gastroenterology

## 2018-10-08 NOTE — Telephone Encounter (Signed)
I spoke with pt's wife, and she is aware the lab orders should be received by the end of the week. However, the pt is going to see PCP tomorrow and would like the results of last CBC to take with him. Have printed the new orders to be done next week also. Leaving at front for pick up.

## 2018-10-08 NOTE — Telephone Encounter (Signed)
Pt's wife called saying they haven't received his lab orders in the mail yet. [phone]

## 2018-10-09 DIAGNOSIS — D72829 Elevated white blood cell count, unspecified: Secondary | ICD-10-CM | POA: Diagnosis not present

## 2018-10-09 LAB — CBC WITH DIFFERENTIAL/PLATELET
BASOS ABS: 18 {cells}/uL (ref 0–200)
Basophils Relative: 0.2 %
EOS ABS: 137 {cells}/uL (ref 15–500)
EOS PCT: 1.5 %
HEMATOCRIT: 40.1 % (ref 38.5–50.0)
Hemoglobin: 13.4 g/dL (ref 13.2–17.1)
Lymphs Abs: 1684 cells/uL (ref 850–3900)
MCH: 32.3 pg (ref 27.0–33.0)
MCHC: 33.4 g/dL (ref 32.0–36.0)
MCV: 96.6 fL (ref 80.0–100.0)
MPV: 9.2 fL (ref 7.5–12.5)
Monocytes Relative: 6.7 %
NEUTROS ABS: 6652 {cells}/uL (ref 1500–7800)
NEUTROS PCT: 73.1 %
Platelets: 217 10*3/uL (ref 140–400)
RBC: 4.15 10*6/uL — ABNORMAL LOW (ref 4.20–5.80)
RDW: 12.8 % (ref 11.0–15.0)
Total Lymphocyte: 18.5 %
WBC mixed population: 610 cells/uL (ref 200–950)
WBC: 9.1 10*3/uL (ref 3.8–10.8)

## 2018-10-09 NOTE — Progress Notes (Signed)
Pt is aware.  

## 2018-11-26 DIAGNOSIS — I10 Essential (primary) hypertension: Secondary | ICD-10-CM | POA: Diagnosis not present

## 2018-11-26 DIAGNOSIS — I4891 Unspecified atrial fibrillation: Secondary | ICD-10-CM | POA: Diagnosis not present

## 2018-11-26 DIAGNOSIS — J9611 Chronic respiratory failure with hypoxia: Secondary | ICD-10-CM | POA: Diagnosis not present

## 2018-11-26 DIAGNOSIS — J441 Chronic obstructive pulmonary disease with (acute) exacerbation: Secondary | ICD-10-CM | POA: Diagnosis not present

## 2018-12-29 DIAGNOSIS — I4891 Unspecified atrial fibrillation: Secondary | ICD-10-CM | POA: Diagnosis not present

## 2018-12-29 DIAGNOSIS — J449 Chronic obstructive pulmonary disease, unspecified: Secondary | ICD-10-CM | POA: Diagnosis not present

## 2018-12-29 DIAGNOSIS — I10 Essential (primary) hypertension: Secondary | ICD-10-CM | POA: Diagnosis not present

## 2018-12-29 DIAGNOSIS — J9611 Chronic respiratory failure with hypoxia: Secondary | ICD-10-CM | POA: Diagnosis not present

## 2019-03-10 ENCOUNTER — Encounter: Payer: Self-pay | Admitting: Gastroenterology

## 2019-04-08 ENCOUNTER — Encounter: Payer: Self-pay | Admitting: Gastroenterology

## 2020-03-22 ENCOUNTER — Other Ambulatory Visit: Payer: Self-pay | Admitting: Family Medicine

## 2020-03-22 ENCOUNTER — Ambulatory Visit (INDEPENDENT_AMBULATORY_CARE_PROVIDER_SITE_OTHER): Payer: Medicare Other | Admitting: Family Medicine

## 2020-03-22 ENCOUNTER — Encounter: Payer: Self-pay | Admitting: Family Medicine

## 2020-03-22 ENCOUNTER — Other Ambulatory Visit: Payer: Self-pay

## 2020-03-22 VITALS — BP 171/75 | HR 67 | Temp 97.6°F | Ht 67.0 in | Wt 184.6 lb

## 2020-03-22 DIAGNOSIS — J440 Chronic obstructive pulmonary disease with acute lower respiratory infection: Secondary | ICD-10-CM

## 2020-03-22 DIAGNOSIS — I4891 Unspecified atrial fibrillation: Secondary | ICD-10-CM

## 2020-03-22 DIAGNOSIS — M109 Gout, unspecified: Secondary | ICD-10-CM | POA: Insufficient documentation

## 2020-03-22 DIAGNOSIS — R413 Other amnesia: Secondary | ICD-10-CM | POA: Insufficient documentation

## 2020-03-22 DIAGNOSIS — I10 Essential (primary) hypertension: Secondary | ICD-10-CM

## 2020-03-22 DIAGNOSIS — K219 Gastro-esophageal reflux disease without esophagitis: Secondary | ICD-10-CM

## 2020-03-22 DIAGNOSIS — E119 Type 2 diabetes mellitus without complications: Secondary | ICD-10-CM

## 2020-03-22 DIAGNOSIS — E039 Hypothyroidism, unspecified: Secondary | ICD-10-CM

## 2020-03-22 DIAGNOSIS — E785 Hyperlipidemia, unspecified: Secondary | ICD-10-CM

## 2020-03-22 MED ORDER — DILTIAZEM HCL ER 120 MG PO CP12: 120.0000 mg | ORAL_CAPSULE | Freq: Two times a day (BID) | ORAL | 1 refills | Status: DC

## 2020-03-22 MED ORDER — METFORMIN HCL 1000 MG PO TABS: 1000.0000 mg | ORAL_TABLET | Freq: Two times a day (BID) | ORAL | 5 refills | Status: AC

## 2020-03-22 MED ORDER — LANTUS SOLOSTAR 100 UNIT/ML ~~LOC~~ SOPN: PEN_INJECTOR | SUBCUTANEOUS | 5 refills | Status: AC

## 2020-03-22 NOTE — Progress Notes (Addendum)
New Patient Office Visit  Subjective:  Patient ID: Caleb [name], male    DOB: [birth year]  Age: 85 y.o. MRN: [id number]  CC:  Chief Complaint  Patient presents with  . Diabetes    need a refill on metformin    HPI Caleb [name] presents for DM-Glucophage-glucose 90-110, Lantus 30units GERD-EGD/Colonoscopy-2016 COPD-oxygen -2L continuous-[facility]-albuterol, nebulizer-uses prn Afib/CAD-no pain, no chest pain [facility]-Lantus increased by Dr. Luan Pulling in 2020 prior to retirement Gout-no recent exacerbations [facility]-1/8-1/20-pneumonia no COVID-lisinopril/diltiazem and oxybutynin stopped at discharge-reviewed pts discharge summary Past Medical History:  Diagnosis Date  . Acute exacerbation of chronic bronchitis ([place])   . Anemia   . BPH (benign prostatic hypertrophy)   . CAD (coronary artery disease)   . Diabetes mellitus without complication ([place])   . Diverticulitis   . Diverticulosis   . GERD (gastroesophageal reflux disease)   . GI bleed   . History of tuberculosis   . Hyperlipidemia   . Hyperthyroidism     Past Surgical History:  Procedure Laterality Date  . CHOLECYSTECTOMY    . COLONOSCOPY N/A 11/24/2015   Dr. Gala Romney: pancolonic diverticulosis, polys removed and path consistent with adenomas  . ERCP  09/2005   Dr. Laural Golden: biliary leak, ERCP with stent placement  . ERCP  11/2005   Dr. Laural Golden: ERCP with removal of biliary stent and sphincterotomy   . ESOPHAGOGASTRODUODENOSCOPY N/A 11/23/2015   Dr. Gala Romney: normal esophagus, s/p dilation. duodenal diverticulum  . GIVENS CAPSULE STUDY N/A 11/24/2015   unremarkable, however was incomplete. No obvious bleeding source  . KNEE ARTHROSCOPY    . THYROIDECTOMY      Family History  Problem Relation Age of Onset  . Heart failure Mother   . Heart attack Father   . Stomach cancer Sister        deceased in her 7s from stomach cancer  . Colon cancer Neg Hx     Social History    Socioeconomic History  . Marital status: Married    Spouse name: Not on file  . Number of children: Not on file  . Years of education: Not on file  . Highest education level: Not on file  Occupational History  . Occupation: Retired    Fish farm manager: RETIRED  Tobacco Use  . Smoking status: Former Smoker    Packs/day: 0.75    Years: 50.00    Pack years: 37.50    Types: Cigarettes    Start date: 09/16/1944    Quit date: 10/17/1998    Years since quitting: 21.4  . Smokeless tobacco: Never Used  Substance and Sexual Activity  . Alcohol use: No  . Drug use: No  . Sexual activity: Not on file  Other Topics Concern  . 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 Royce Macadamia   Social Determinants of Health   Financial Resource Strain:   . Difficulty of Paying Living Expenses:   Food Insecurity:   . Worried About Charity fundraiser in the Last Year:   . Arboriculturist in the Last Year:   Transportation Needs:   . Film/video editor (Medical):   Marland Kitchen Lack of Transportation (Non-Medical):   Physical Activity:   . Days of Exercise per Week:   . Minutes  of Exercise per Session:   Stress:   . Feeling of Stress :   Social Connections:   . Frequency of Communication with Friends and Family:   . Frequency of Social Gatherings with Friends and Family:   . Attends Religious Services:   . Active Member of Clubs or Organizations:   . Attends Archivist Meetings:   Marland Kitchen Marital Status:   Intimate Partner Violence:   . Fear of Current or Ex-Partner:   . Emotionally Abused:   Marland Kitchen Physically Abused:   . Sexually Abused:     ROS Review of Systems  HENT: Positive for congestion, hearing loss and sinus pressure.        Pt can not wear hearing aids due to psoriasis  Eyes: Positive for pain and itching.       Cataracts repaired   Respiratory:       COPD TB  Endocrine:       DM-insulin dependent  Musculoskeletal: Positive for arthralgias, back pain and gait problem.  Allergic/Immunologic: Positive for environmental allergies.  Neurological: Positive for headaches.  Psychiatric/Behavioral: Negative.     Objective:   Today's Vitals: BP (!) 171/75 (BP Location: Left Arm, Patient Position: Sitting, Cuff Size: Large)   Pulse 67   Temp 97.6 F (36.4 C) (Temporal)   Ht 5\' 7"  (1.702 m)   Wt 184 lb 9.6 oz (83.7 kg)   SpO2 97%   BMI 28.91 kg/m   Physical Exam Constitutional:      Appearance: Normal appearance.  Eyes:     Conjunctiva/sclera: Conjunctivae normal.  Cardiovascular:     Rate and Rhythm: Normal rate. Rhythm irregular.     Pulses: Normal pulses.     Heart sounds: Murmur present.  Pulmonary:     Effort: Pulmonary effort is normal.     Breath sounds: Normal breath sounds.  Musculoskeletal:     Cervical back: Normal range of motion and neck supple.  Neurological:     Mental Status: He is alert and oriented to person, place, and time.  Psychiatric:        Mood and Affect: Mood normal.        Behavior: Behavior normal.     Comments: Memory loss-driving , no difficulty     Assessment & Plan:   1. Diabetes mellitus without complication (HCC) - metFORMIN (GLUCOPHAGE) 1000 MG tablet; Take 1 tablet (1,000 mg total) by mouth 2 (two) times daily with a meal.  Dispense: 60 tablet; Refill: 5 - COMPLETE METABOLIC PANEL WITH GFR - Hemoglobin A1c - CBC w/Diff/Platelet - Microalbumin, urine  2. Essential hypertension, benign cardizem-restart-pt taken off medication while hospitalized  3. Atrial fibrillation, unspecified type [facility]-re-start cardizem-taken off lisinopril and cardizem during hospitalization 4. Chronic obstructive pulmonary disease with acute lower respiratory infection (HCC) Oxygen -continuous, pulmonary at [facility], singulair, mucinex, albuterol 5.  Gastroesophageal reflux disease without esophagitis Linzess/protonix/ 6. Memory loss Razadyne-"forgetful" 7. Hypothyroidism, unspecified type Synthroid 156mcg daily-TSH 8. Hyperlipidemia, unspecified hyperlipidemia type Crestor-lipid panel - TSH 9. Gout, unspecified cause, unspecified chronicity, unspecified site Allopurinol, uric acid Outpatient Encounter Medications as of 03/22/2020  Medication Sig  . allopurinol (ZYLOPRIM) 300 MG tablet Take 300 mg by mouth daily as needed (for gout).  . Ascorbic Acid (VITAMIN C) 500 MG tablet Take 500 mg by mouth 2 (two) times daily.   . cholecalciferol (VITAMIN D) 1000 UNITS tablet Take 4,000 Units by mouth daily.  Marland Kitchen docusate sodium (COLACE) 100 MG capsule Take 300 mg  by mouth 2 (two) times daily.  . ferrous sulfate 325 (65 FE) MG tablet Take 325 mg by mouth 2 (two) times daily with a meal.  . finasteride (PROSCAR) 5 MG tablet Take 5 mg by mouth daily.    . folic acid (FOLVITE) 1 MG tablet Take 1 mg by mouth daily.  Marland Kitchen gabapentin (NEURONTIN) 100 MG capsule Take 200 mg by mouth at bedtime.  Marland Kitchen galantamine (RAZADYNE) 8 MG tablet Take 8 mg by mouth 2 (two) times daily.    Marland Kitchen guaiFENesin 200 MG tablet Take 400 mg by mouth 2 (two) times daily.  . Insulin Glargine (LANTUS SOLOSTAR) 100 UNIT/ML Solostar Pen Inject 24 Units into the skin every evening.   Marland Kitchen levothyroxine (SYNTHROID, LEVOTHROID) 150 MCG tablet Take 150 mcg by mouth daily before breakfast.  . linaclotide (LINZESS) 72 MCG capsule Take 1 capsule (72 mcg total) by mouth daily before breakfast.  . loratadine (CLARITIN) 10 MG tablet Take 10 mg by mouth daily.    . magnesium oxide (MAG-OX) 400 MG tablet Take 400 mg by mouth 2 (two) times daily.   . meclizine (ANTIVERT) 25 MG tablet Take 25 mg by mouth 4 (four) times daily as needed for dizziness or nausea.  . metFORMIN (GLUCOPHAGE) 1000 MG tablet Take 1,000 mg by mouth 2 (two) times daily with a meal.  . montelukast (SINGULAIR) 10 MG tablet Take 10 mg  by mouth at bedtime.  . Multiple Vitamin (MULTIVITAMIN WITH MINERALS) TABS tablet Take 1 tablet by mouth daily.  . ondansetron (ZOFRAN) 4 MG tablet Take 4 mg by mouth every 8 (eight) hours as needed for nausea or vomiting.  . pantoprazole (PROTONIX) 40 MG tablet Take 40 mg by mouth 2 (two) times daily.  . rosuvastatin (CRESTOR) 40 MG tablet Take 20 mg by mouth at bedtime.   . tamsulosin (FLOMAX) 0.4 MG CAPS capsule Take 0.4 mg by mouth daily.  . [DISCONTINUED] diltiazem (CARDIZEM CD) 120 MG 24 hr capsule Take 120 mg by mouth daily.  . [DISCONTINUED] lisinopril (PRINIVIL,ZESTRIL) 20 MG tablet Take 10 mg by mouth daily.   . [DISCONTINUED] oxybutynin (DITROPAN) 5 MG tablet Take 5 mg by mouth 2 (two) times daily.    No facility-administered encounter medications on file as of 03/22/2020.  65 minutes reviewing chart,old records, history, physical , assessment, plan, labwork  Follow-up:  Keep appointment with primary care-Dr. Fontaine-4/21, labwork fasting  Damareon Lanni Hannah Beat, MD

## 2020-03-22 NOTE — Patient Instructions (Addendum)
Cardizem-restart for blood pressure labwork-fasting    If you have lab work done today you will be contacted with your lab results within the next 2 weeks.  If you have not heard from [place] then please contact us. The fastest way to get your results is to register for My Chart.   IF you received an x-ray today, you will receive an invoice from [facility]. Please contact [facility] at [phone] with questions or concerns regarding your invoice.   IF you received labwork today, you will receive an invoice from [place]. Please contact [facility] at [phone] with questions or concerns regarding your invoice.   Our billing staff will not be able to assist you with questions regarding bills from these companies.  You will be contacted with the lab results as soon as they are available. The fastest way to get your results is to activate your My Chart account. Instructions are located on the last page of this paperwork. If you have not heard from [place] regarding the results in 2 weeks, please contact this office.

## 2020-03-22 NOTE — Telephone Encounter (Signed)
You just filled this med today but patient want a 90 day supply is this ok?

## 2020-09-23 DIAGNOSIS — N390 Urinary tract infection, site not specified: Secondary | ICD-10-CM | POA: Diagnosis not present

## 2020-09-26 DIAGNOSIS — J449 Chronic obstructive pulmonary disease, unspecified: Secondary | ICD-10-CM | POA: Diagnosis not present

## 2020-09-26 DIAGNOSIS — J45909 Unspecified asthma, uncomplicated: Secondary | ICD-10-CM | POA: Diagnosis not present

## 2020-09-26 DIAGNOSIS — J329 Chronic sinusitis, unspecified: Secondary | ICD-10-CM | POA: Diagnosis not present

## 2020-10-31 DIAGNOSIS — J449 Chronic obstructive pulmonary disease, unspecified: Secondary | ICD-10-CM | POA: Diagnosis not present

## 2020-10-31 DIAGNOSIS — I10 Essential (primary) hypertension: Secondary | ICD-10-CM | POA: Diagnosis not present

## 2020-10-31 DIAGNOSIS — E119 Type 2 diabetes mellitus without complications: Secondary | ICD-10-CM | POA: Diagnosis not present

## 2020-11-30 DIAGNOSIS — E119 Type 2 diabetes mellitus without complications: Secondary | ICD-10-CM | POA: Diagnosis not present

## 2020-11-30 DIAGNOSIS — I10 Essential (primary) hypertension: Secondary | ICD-10-CM | POA: Diagnosis not present

## 2021-02-06 DIAGNOSIS — I10 Essential (primary) hypertension: Secondary | ICD-10-CM | POA: Diagnosis not present

## 2021-02-06 DIAGNOSIS — E119 Type 2 diabetes mellitus without complications: Secondary | ICD-10-CM | POA: Diagnosis not present

## 2021-02-14 ENCOUNTER — Other Ambulatory Visit: Payer: Self-pay | Admitting: Student

## 2021-02-14 ENCOUNTER — Other Ambulatory Visit (HOSPITAL_COMMUNITY): Payer: Self-pay | Admitting: Student

## 2021-02-14 DIAGNOSIS — R911 Solitary pulmonary nodule: Secondary | ICD-10-CM

## 2021-02-16 ENCOUNTER — Other Ambulatory Visit: Payer: Self-pay | Admitting: Radiation Oncology

## 2021-02-16 ENCOUNTER — Ambulatory Visit
Admission: RE | Admit: 2021-02-16 | Discharge: 2021-02-16 | Disposition: A | Discharge status: Home / Self Care | Payer: Self-pay | Source: Ambulatory Visit | Attending: Radiation Oncology | Admitting: Radiation Oncology

## 2021-02-16 DIAGNOSIS — C3490 Malignant neoplasm of unspecified part of unspecified bronchus or lung: Secondary | ICD-10-CM

## 2021-02-20 NOTE — Progress Notes (Signed)
Radiation Oncology         [phone] ________________________________  Initial Outpatient Consultation  Name: Caleb [name] MRN: [id number]  Date: 02/22/2021  DOB: 1935-09-02  SH:[id number], No Pcp Per  [facility]   REFERRING PHYSICIAN: [facility]  DIAGNOSIS: The encounter diagnosis was Primary cancer of right lower lobe of lung ([place]).  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 male who is seen as a courtesy of Dr. Margit Banda for an opinion concerning radiation therapy as part of management for his recently diagnosed lung cancer. Today, he is accompanied by no one. The patient underwent a follow-up chest CT scan on 01/20/2021 for follow-up of right lung mass. Results showed a slightly enlarged right lower lobe superior segment irregular/spiculated nodular opacity that measured approximately 16 x 9 mm as compared to 10 x 7 mm in 20109. Adenocarcinoma of the lung was not excluded. It also showed slowly enlarging pulmonary nodules within the right upper lobe. Finally, there were multifocal areas of non-specific tree-in-bud nodularity and diffuse wall thickening with debris in the distal esophagus that was also non-specific.  Of note, the patient deferred lung biopsy in the past. PET scan is scheduled for 02/27/2021.  Reports having tuberculosis in his 86s and required hospitalization for this on and off for approximately 3 years.  He reports being disabled from this issue.  He served overseas in [place] prior to contracting tuberculosis.  PREVIOUS RADIATION THERAPY: No  PAST MEDICAL HISTORY:  Past Medical History:  Diagnosis Date  . Acute exacerbation of chronic bronchitis ([place])   . Anemia   . BPH (benign prostatic hypertrophy)   . CAD (coronary artery disease)   . Diabetes mellitus without complication ([place])   . Diverticulitis   . Diverticulosis   . GERD (gastroesophageal reflux  disease)   . GI bleed   . History of tuberculosis   . Hyperlipidemia   . Hyperthyroidism     PAST SURGICAL HISTORY: Past Surgical History:  Procedure Laterality Date  . CHOLECYSTECTOMY    . COLONOSCOPY N/A 11/24/2015   Dr. Gala Romney: pancolonic diverticulosis, polys removed and path consistent with adenomas  . ERCP  09/2005   Dr. Laural Golden: biliary leak, ERCP with stent placement  . ERCP  11/2005   Dr. Laural Golden: ERCP with removal of biliary stent and sphincterotomy   . ESOPHAGOGASTRODUODENOSCOPY N/A 11/23/2015   Dr. Gala Romney: normal esophagus, s/p dilation. duodenal diverticulum  . GIVENS CAPSULE STUDY N/A 11/24/2015   unremarkable, however was incomplete. No obvious bleeding source  . KNEE ARTHROSCOPY    . THYROIDECTOMY      FAMILY HISTORY:  Family History  Problem Relation Age of Onset  . Heart failure Mother   . Heart attack Father   . Stomach cancer Sister        deceased in her 68s from stomach cancer  . Colon cancer Neg Hx     SOCIAL HISTORY:  Social History   Tobacco Use  . Smoking status: Former Smoker    Packs/day: 0.75    Years: 50.00    Pack years: 37.50    Types: Cigarettes    Start date: 09/16/1944    Quit date: 10/17/1998    Years since quitting: 22.3  . Smokeless tobacco: Never Used  Substance Use Topics  . Alcohol use: No  . Drug use: No    ALLERGIES:  Allergies  Allergen Reactions  . Levaquin [Levofloxacin In D5w]  SWELLING    MEDICATIONS:  Current Outpatient Medications  Medication Sig Dispense Refill  . allopurinol (ZYLOPRIM) 300 MG tablet Take 300 mg by mouth daily as needed (for gout).    . Ascorbic Acid (VITAMIN C) 500 MG tablet Take 500 mg by mouth 2 (two) times daily.    . cholecalciferol (VITAMIN D) 1000 UNITS tablet Take 4,000 Units by mouth daily.    Marland Kitchen diltiazem (CARDIZEM SR) 120 MG 12 hr capsule TAKE 1 CAPSULE(120 MG) BY MOUTH TWICE DAILY 180 capsule 0  . docusate sodium (COLACE) 100 MG capsule Take 300 mg by mouth 2 (two) times daily.     . ferrous sulfate 325 (65 FE) MG tablet Take 325 mg by mouth 2 (two) times daily with a meal.    . finasteride (PROSCAR) 5 MG tablet Take 5 mg by mouth daily.      . folic acid (FOLVITE) 1 MG tablet Take 1 mg by mouth daily.    Marland Kitchen gabapentin (NEURONTIN) 100 MG capsule Take 200 mg by mouth at bedtime.    Marland Kitchen galantamine (RAZADYNE) 8 MG tablet Take 8 mg by mouth 2 (two) times daily.    Marland Kitchen guaiFENesin 200 MG tablet Take 400 mg by mouth 2 (two) times daily.    . insulin glargine (LANTUS SOLOSTAR) 100 UNIT/ML Solostar Pen Take 30units SQ nightly 15 mL 5  . levothyroxine (SYNTHROID, LEVOTHROID) 150 MCG tablet Take 150 mcg by mouth daily before breakfast.    . linaclotide (LINZESS) 72 MCG capsule Take 1 capsule (72 mcg total) by mouth daily before breakfast. 30 capsule 5  . magnesium oxide (MAG-OX) 400 MG tablet Take 400 mg by mouth 2 (two) times daily.     . meclizine (ANTIVERT) 25 MG tablet Take 25 mg by mouth 4 (four) times daily as needed for dizziness or nausea.    . metFORMIN (GLUCOPHAGE) 1000 MG tablet Take 1 tablet (1,000 mg total) by mouth 2 (two) times daily with a meal. 60 tablet 5  . montelukast (SINGULAIR) 10 MG tablet Take 10 mg by mouth at bedtime.    . Multiple Vitamin (MULTIVITAMIN WITH MINERALS) TABS tablet Take 1 tablet by mouth daily.    . ondansetron (ZOFRAN) 4 MG tablet Take 4 mg by mouth every 8 (eight) hours as needed for nausea or vomiting.    . pantoprazole (PROTONIX) 40 MG tablet Take 40 mg by mouth 2 (two) times daily.    . rosuvastatin (CRESTOR) 40 MG tablet Take 20 mg by mouth at bedtime.     Marland Kitchen loratadine (CLARITIN) 10 MG tablet Take 10 mg by mouth daily.   (Patient not taking: Reported on 02/22/2021)    . tamsulosin (FLOMAX) 0.4 MG CAPS capsule Take 0.4 mg by mouth daily. (Patient not taking: Reported on 02/22/2021)     No current facility-administered medications for this encounter.    REVIEW OF SYSTEMS:  A 10+ POINT REVIEW OF SYSTEMS WAS OBTAINED including neurology,  dermatology, psychiatry, cardiac, respiratory, lymph, extremities, GI, GU, musculoskeletal, constitutional, reproductive, HEENT.  He denies any pain within the chest significant cough or hemoptysis.  He reports being on 2 L of oxygen but often times during the day does not need this.  He reports not being able to ambulate a flight of steps due to his strength and breathing issues.   PHYSICAL EXAM:  height is 5\' 7"  (1.702 m) and weight is 178 lb (80.7 kg). His temporal temperature is 97.5 F (36.4 C) (abnormal). His blood pressure is 113/59 (  abnormal) and his pulse is 75. His respiration is 20 and oxygen saturation is 94%.   General: Alert and oriented, in no acute distress, remains in wheelchair for examination.  Supplemental oxygen in place at 2 L. HEENT: Head is normocephalic. Extraocular movements are intact.  Neck: Neck is supple, no palpable cervical or supraclavicular lymphadenopathy. Heart: Regular in rate and rhythm with no murmurs, rubs, or gallops. Chest: Clear to auscultation bilaterally, with no rhonchi, wheezes, or rales. Abdomen: Soft, nontender, nondistended, with no rigidity or guarding. Extremities: No cyanosis or edema. Lymphatics: see Neck Exam Skin: No concerning lesions. Musculoskeletal: symmetric strength and muscle tone throughout. Neurologic: Cranial nerves II through XII are grossly intact. No obvious focalities. Speech is fluent. Coordination is intact. Psychiatric: Judgment and insight are intact. Affect is appropriate.   ECOG = 1  0 - Asymptomatic (Fully active, able to carry on all predisease activities without restriction)  1 - Symptomatic but completely ambulatory (Restricted in physically strenuous activity but ambulatory and able to carry out work of a light or sedentary nature. For example, light housework, office work)  2 - Symptomatic, <50% in bed during the day (Ambulatory and capable of all self care but unable to carry out any work activities. Up and  about more than 50% of waking hours)  3 - Symptomatic, >50% in bed, but not bedbound (Capable of only limited self-care, confined to bed or chair 50% or more of waking hours)  4 - Bedbound (Completely disabled. Cannot carry on any self-care. Totally confined to bed or chair)  5 - Death   Eustace Pen MM, Creech RH, Tormey DC, et al. [phone]). "Toxicity and response criteria of the [facility] Group". [place] Oncol. 5 (6): 649-55  LABORATORY DATA:  Lab Results  Component Value Date   WBC 9.1 10/09/2018   HGB 13.4 10/09/2018   HCT 40.1 10/09/2018   MCV 96.6 10/09/2018   PLT 217 10/09/2018   NEUTROABS 6,652 10/09/2018   Lab Results  Component Value Date   NA 134 (L) 10/09/2016   K 3.8 10/09/2016   CL 98 (L) 10/09/2016   CO2 29 10/09/2016   GLUCOSE 166 (H) 10/09/2016   CREATININE 0.66 10/09/2016   CALCIUM 8.0 (L) 10/09/2016      RADIOGRAPHY: No results found.    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  We discussed the findings thus far.  I did discuss potential biopsy of this lesion to determine the diagnosis.  Given the patient's respiratory status and his desires as well as age he does not wish to consider CT-guided biopsy of this area.  He also talked about possible navigational bronchoscopy in an attempt to obtain the diagnosis.  Patient is scheduled for PET scan early next week to further evaluate this lesion.  I discussed with the patient that it will sometimes consider radiation therapy for a clinical stage I presumptive non-small cell lung cancers if tissue cannot be obtained to determine diagnosis.  I discussed potential treatment for this lesion including stereotactic body radiation therapy and conventional external beam radiation therapy.  Based on the size of the lesion and location the patient would be a candidate for stereotactic body radiation therapy which would be more effective than controlling this lesion, much  easier, and convenient for the patient as well as less risk for large volumes of guarding in his lung scarring then conventional external beam radiation therapy. he appears interested in SBRT.   PLAN: PET scan  is scheduled for 02/27/2021.  Final treatment plans are pending results of this PET scan early next week.  Total time spent in this encounter was 60 minutes which included reviewing the patient's most recent chest CT scan, physical examination, and documentation.   ------------------------------------------------  Blair Promise, PhD, MD  This document serves as a record of services personally performed by Gery Pray, MD. It was created on his behalf by Clerance Lav, a trained medical scribe. The creation of this record is based on the scribe's personal observations and the provider's statements to them. This document has been checked and approved by the attending provider.

## 2021-02-22 ENCOUNTER — Encounter: Payer: Self-pay | Admitting: Radiation Oncology

## 2021-02-22 ENCOUNTER — Ambulatory Visit
Admission: RE | Admit: 2021-02-22 | Discharge: 2021-02-22 | Disposition: A | Discharge status: Home / Self Care | Payer: No Typology Code available for payment source | Source: Ambulatory Visit | Attending: Radiation Oncology | Admitting: Radiation Oncology

## 2021-02-22 ENCOUNTER — Other Ambulatory Visit: Payer: Self-pay

## 2021-02-22 VITALS — BP 113/59 | HR 75 | Temp 97.5°F | Resp 20 | Ht 67.0 in | Wt 178.0 lb

## 2021-02-22 DIAGNOSIS — E785 Hyperlipidemia, unspecified: Secondary | ICD-10-CM | POA: Insufficient documentation

## 2021-02-22 DIAGNOSIS — I2510 Atherosclerotic heart disease of native coronary artery without angina pectoris: Secondary | ICD-10-CM | POA: Diagnosis not present

## 2021-02-22 DIAGNOSIS — C3431 Malignant neoplasm of lower lobe, right bronchus or lung: Secondary | ICD-10-CM | POA: Diagnosis present

## 2021-02-22 DIAGNOSIS — Z79899 Other long term (current) drug therapy: Secondary | ICD-10-CM | POA: Diagnosis not present

## 2021-02-22 DIAGNOSIS — Z8611 Personal history of tuberculosis: Secondary | ICD-10-CM | POA: Diagnosis not present

## 2021-02-22 DIAGNOSIS — E119 Type 2 diabetes mellitus without complications: Secondary | ICD-10-CM | POA: Diagnosis not present

## 2021-02-22 DIAGNOSIS — Z794 Long term (current) use of insulin: Secondary | ICD-10-CM | POA: Insufficient documentation

## 2021-02-22 DIAGNOSIS — N400 Enlarged prostate without lower urinary tract symptoms: Secondary | ICD-10-CM | POA: Diagnosis not present

## 2021-02-22 DIAGNOSIS — Z87891 Personal history of nicotine dependence: Secondary | ICD-10-CM | POA: Insufficient documentation

## 2021-02-22 DIAGNOSIS — K219 Gastro-esophageal reflux disease without esophagitis: Secondary | ICD-10-CM | POA: Diagnosis not present

## 2021-02-22 DIAGNOSIS — C3411 Malignant neoplasm of upper lobe, right bronchus or lung: Secondary | ICD-10-CM

## 2021-02-22 NOTE — Progress Notes (Signed)
Thoracic Location of Tumor / Histology:  Right Lung  Biopsies of   Tobacco/Marijuana/Snuff/ETOH use: no  Past/Anticipated interventions by cardiothoracic surgery, if any:   Past/Anticipated interventions by medical oncology, if any:   Signs/Symptoms  Weight changes, if any:  no  Respiratory complaints, if any: with activity and at rest  Hemoptysis, if any: none  Pain issues, if any:  none  SAFETY ISSUES:  Prior radiation? No  Pacemaker/ICD?  NO  Possible current pregnancy? no  Is the patient on methotrexate?  no  Current Complaints / other details:   Vitals:   02/22/21 1220  BP: (!) 113/59  Pulse: 75  Resp: 20  Temp: (!) 97.5 F (36.4 C)  TempSrc: Temporal  SpO2: 94%  Weight: 80.7 kg  Height: 5\' 7"  (1.702 m)

## 2021-02-27 ENCOUNTER — Ambulatory Visit (HOSPITAL_COMMUNITY)
Admission: RE | Admit: 2021-02-27 | Discharge: 2021-02-27 | Disposition: A | Discharge status: Home / Self Care | Payer: No Typology Code available for payment source | Source: Ambulatory Visit | Attending: Family Medicine | Admitting: Family Medicine

## 2021-02-27 ENCOUNTER — Other Ambulatory Visit: Payer: Self-pay

## 2021-02-27 DIAGNOSIS — R911 Solitary pulmonary nodule: Secondary | ICD-10-CM | POA: Diagnosis not present

## 2021-02-27 LAB — GLUCOSE, CAPILLARY: Glucose-Capillary: 99 mg/dL (ref 70–99)

## 2021-02-27 MED ORDER — FLUDEOXYGLUCOSE F - 18 (FDG) INJECTION
9.2000 | Freq: Once | INTRAVENOUS | Status: AC | PRN
Start: 2021-02-27 — End: 2021-02-27
  Administered 2021-02-27: 8.9 via INTRAVENOUS

## 2021-03-07 ENCOUNTER — Ambulatory Visit: Payer: Non-veteran care | Admitting: Radiation Oncology

## 2021-03-09 ENCOUNTER — Ambulatory Visit
Admission: RE | Admit: 2021-03-09 | Discharge: 2021-03-09 | Disposition: A | Discharge status: Home / Self Care | Payer: No Typology Code available for payment source | Source: Ambulatory Visit | Attending: Radiation Oncology | Admitting: Radiation Oncology

## 2021-03-09 ENCOUNTER — Other Ambulatory Visit: Payer: Self-pay

## 2021-03-09 DIAGNOSIS — Z87891 Personal history of nicotine dependence: Secondary | ICD-10-CM | POA: Diagnosis not present

## 2021-03-09 DIAGNOSIS — C3431 Malignant neoplasm of lower lobe, right bronchus or lung: Secondary | ICD-10-CM | POA: Insufficient documentation

## 2021-03-09 DIAGNOSIS — Z510 Encounter for antineoplastic radiation therapy: Secondary | ICD-10-CM | POA: Diagnosis present

## 2021-03-09 DIAGNOSIS — J449 Chronic obstructive pulmonary disease, unspecified: Secondary | ICD-10-CM

## 2021-03-19 DIAGNOSIS — R0602 Shortness of breath: Secondary | ICD-10-CM | POA: Insufficient documentation

## 2021-03-19 DIAGNOSIS — J449 Chronic obstructive pulmonary disease, unspecified: Secondary | ICD-10-CM | POA: Diagnosis not present

## 2021-03-22 ENCOUNTER — Other Ambulatory Visit: Payer: Self-pay

## 2021-03-22 ENCOUNTER — Ambulatory Visit
Admission: RE | Admit: 2021-03-22 | Discharge: 2021-03-22 | Disposition: A | Discharge status: Home / Self Care | Payer: No Typology Code available for payment source | Source: Ambulatory Visit | Attending: Radiation Oncology | Admitting: Radiation Oncology

## 2021-03-22 DIAGNOSIS — J449 Chronic obstructive pulmonary disease, unspecified: Secondary | ICD-10-CM

## 2021-03-23 ENCOUNTER — Ambulatory Visit: Payer: No Typology Code available for payment source | Admitting: Radiation Oncology

## 2021-03-24 ENCOUNTER — Ambulatory Visit
Admission: RE | Admit: 2021-03-24 | Discharge: 2021-03-24 | Disposition: A | Discharge status: Home / Self Care | Payer: No Typology Code available for payment source | Source: Ambulatory Visit | Attending: Radiation Oncology | Admitting: Radiation Oncology

## 2021-03-24 DIAGNOSIS — J449 Chronic obstructive pulmonary disease, unspecified: Secondary | ICD-10-CM | POA: Diagnosis not present

## 2021-03-29 ENCOUNTER — Ambulatory Visit
Admission: RE | Admit: 2021-03-29 | Discharge: 2021-03-29 | Disposition: A | Discharge status: Home / Self Care | Payer: No Typology Code available for payment source | Source: Ambulatory Visit | Attending: Radiation Oncology | Admitting: Radiation Oncology

## 2021-03-29 ENCOUNTER — Other Ambulatory Visit: Payer: Self-pay

## 2021-03-29 ENCOUNTER — Encounter: Payer: Self-pay | Admitting: General Practice

## 2021-03-29 DIAGNOSIS — J449 Chronic obstructive pulmonary disease, unspecified: Secondary | ICD-10-CM | POA: Diagnosis not present

## 2021-03-29 DIAGNOSIS — R0602 Shortness of breath: Secondary | ICD-10-CM

## 2021-03-29 NOTE — Progress Notes (Addendum)
[place] CSW Progress Notes  Called patients contacts at request of Sherlon Handing, RN from radiation oncology.  Bed bugs found on patient during his final radiation treatment today.  Per son, patient was accompanied to today's visit by his wife and another son who lives w them.  Family is now aware of the issue and will get community exterminator to assist w bed bug eradication.  No further concerns re patient at this time.  Reached patient - he categorically denies having any bedbugs, states he scratched his head due to psoriasis.  States he has been spraying his home for bed bugs off/on for quite some time - believes they have been eradicated at this point.  States he does not have funds for nor does he desire any treatment for bed bugs.  Edwyna Shell, LCSW Clinical Social Worker Phone:  [phone]

## 2021-04-06 DIAGNOSIS — J449 Chronic obstructive pulmonary disease, unspecified: Secondary | ICD-10-CM | POA: Diagnosis not present

## 2021-04-06 DIAGNOSIS — E119 Type 2 diabetes mellitus without complications: Secondary | ICD-10-CM | POA: Diagnosis not present

## 2021-04-17 DIAGNOSIS — E119 Type 2 diabetes mellitus without complications: Secondary | ICD-10-CM | POA: Diagnosis not present

## 2021-04-17 DIAGNOSIS — J449 Chronic obstructive pulmonary disease, unspecified: Secondary | ICD-10-CM | POA: Diagnosis not present

## 2021-04-17 DIAGNOSIS — Z1389 Encounter for screening for other disorder: Secondary | ICD-10-CM | POA: Diagnosis not present

## 2021-04-17 DIAGNOSIS — Z79899 Other long term (current) drug therapy: Secondary | ICD-10-CM | POA: Diagnosis not present

## 2021-04-17 DIAGNOSIS — Z0001 Encounter for general adult medical examination with abnormal findings: Secondary | ICD-10-CM | POA: Diagnosis not present

## 2021-04-17 DIAGNOSIS — E1036 Type 1 diabetes mellitus with diabetic cataract: Secondary | ICD-10-CM | POA: Diagnosis not present

## 2021-04-17 DIAGNOSIS — E039 Hypothyroidism, unspecified: Secondary | ICD-10-CM | POA: Diagnosis not present

## 2021-05-18 DIAGNOSIS — I10 Essential (primary) hypertension: Secondary | ICD-10-CM | POA: Diagnosis not present

## 2021-05-18 DIAGNOSIS — E119 Type 2 diabetes mellitus without complications: Secondary | ICD-10-CM | POA: Diagnosis not present

## 2021-06-23 ENCOUNTER — Ambulatory Visit (HOSPITAL_COMMUNITY)
Admission: RE | Admit: 2021-06-23 | Discharge: 2021-06-23 | Disposition: A | Discharge status: Home / Self Care | Payer: Medicare Other | Source: Ambulatory Visit | Attending: Gerontology | Admitting: Gerontology

## 2021-06-23 ENCOUNTER — Other Ambulatory Visit (HOSPITAL_COMMUNITY): Payer: Self-pay | Admitting: Gerontology

## 2021-06-23 ENCOUNTER — Other Ambulatory Visit: Payer: Self-pay

## 2021-06-23 DIAGNOSIS — R059 Cough, unspecified: Secondary | ICD-10-CM | POA: Diagnosis present

## 2022-01-02 ENCOUNTER — Telehealth: Payer: Self-pay | Admitting: Cardiology

## 2022-01-02 NOTE — Telephone Encounter (Signed)
Wrong patient-this is pt's father

## 2022-01-02 NOTE — Telephone Encounter (Signed)
STAT if HR is under 50 or over 120 (normal HR is 60-100 beats per minute)  What is your heart rate? 150; checked it again and it was around 40  Do you have a log of your heart rate readings (document readings)? No  Do you have any other symptoms? Chest pains and SOB; headache since starting medication

## 2022-03-18 DIAGNOSIS — I10 Essential (primary) hypertension: Secondary | ICD-10-CM | POA: Diagnosis not present

## 2022-03-18 DIAGNOSIS — E1169 Type 2 diabetes mellitus with other specified complication: Secondary | ICD-10-CM | POA: Diagnosis not present

## 2022-04-16 DIAGNOSIS — Z0001 Encounter for general adult medical examination with abnormal findings: Secondary | ICD-10-CM | POA: Diagnosis not present

## 2022-04-16 DIAGNOSIS — Z1331 Encounter for screening for depression: Secondary | ICD-10-CM | POA: Diagnosis not present

## 2022-04-16 DIAGNOSIS — I10 Essential (primary) hypertension: Secondary | ICD-10-CM | POA: Diagnosis not present

## 2022-04-16 DIAGNOSIS — E039 Hypothyroidism, unspecified: Secondary | ICD-10-CM | POA: Diagnosis not present

## 2022-04-16 DIAGNOSIS — Z1389 Encounter for screening for other disorder: Secondary | ICD-10-CM | POA: Diagnosis not present

## 2022-04-16 DIAGNOSIS — E1165 Type 2 diabetes mellitus with hyperglycemia: Secondary | ICD-10-CM | POA: Diagnosis not present

## 2022-04-16 DIAGNOSIS — J449 Chronic obstructive pulmonary disease, unspecified: Secondary | ICD-10-CM | POA: Diagnosis not present

## 2022-05-02 ENCOUNTER — Ambulatory Visit (HOSPITAL_COMMUNITY)
Admission: RE | Admit: 2022-05-02 | Discharge: 2022-05-02 | Disposition: A | Discharge status: Home / Self Care | Payer: Medicare PPO | Source: Ambulatory Visit | Attending: Gerontology | Admitting: Gerontology

## 2022-05-02 ENCOUNTER — Other Ambulatory Visit (HOSPITAL_COMMUNITY): Payer: Self-pay | Admitting: Gerontology

## 2022-05-02 DIAGNOSIS — J441 Chronic obstructive pulmonary disease with (acute) exacerbation: Secondary | ICD-10-CM | POA: Diagnosis not present

## 2022-05-02 DIAGNOSIS — C3431 Malignant neoplasm of lower lobe, right bronchus or lung: Secondary | ICD-10-CM | POA: Diagnosis not present

## 2022-05-02 DIAGNOSIS — E1165 Type 2 diabetes mellitus with hyperglycemia: Secondary | ICD-10-CM | POA: Diagnosis not present

## 2022-05-02 DIAGNOSIS — I10 Essential (primary) hypertension: Secondary | ICD-10-CM | POA: Diagnosis not present

## 2022-05-02 DIAGNOSIS — R0602 Shortness of breath: Secondary | ICD-10-CM | POA: Diagnosis not present

## 2022-06-11 DIAGNOSIS — I10 Essential (primary) hypertension: Secondary | ICD-10-CM | POA: Diagnosis not present

## 2022-06-11 DIAGNOSIS — J449 Chronic obstructive pulmonary disease, unspecified: Secondary | ICD-10-CM | POA: Diagnosis not present

## 2022-07-12 DIAGNOSIS — I10 Essential (primary) hypertension: Secondary | ICD-10-CM | POA: Diagnosis not present

## 2022-07-12 DIAGNOSIS — E039 Hypothyroidism, unspecified: Secondary | ICD-10-CM | POA: Diagnosis not present

## 2022-08-13 DIAGNOSIS — I10 Essential (primary) hypertension: Secondary | ICD-10-CM | POA: Diagnosis not present

## 2022-08-13 DIAGNOSIS — E1165 Type 2 diabetes mellitus with hyperglycemia: Secondary | ICD-10-CM | POA: Diagnosis not present

## 2022-09-12 DIAGNOSIS — I10 Essential (primary) hypertension: Secondary | ICD-10-CM | POA: Diagnosis not present

## 2022-09-12 DIAGNOSIS — E1165 Type 2 diabetes mellitus with hyperglycemia: Secondary | ICD-10-CM | POA: Diagnosis not present

## 2022-10-12 DIAGNOSIS — E1165 Type 2 diabetes mellitus with hyperglycemia: Secondary | ICD-10-CM | POA: Diagnosis not present

## 2022-10-12 DIAGNOSIS — I10 Essential (primary) hypertension: Secondary | ICD-10-CM | POA: Diagnosis not present

## 2022-10-22 ENCOUNTER — Ambulatory Visit (HOSPITAL_COMMUNITY)
Admission: RE | Admit: 2022-10-22 | Discharge: 2022-10-22 | Disposition: A | Discharge status: Home / Self Care | Payer: Medicare HMO | Source: Ambulatory Visit | Attending: Gerontology | Admitting: Gerontology

## 2022-10-22 ENCOUNTER — Other Ambulatory Visit (HOSPITAL_COMMUNITY): Payer: Self-pay | Admitting: Gerontology

## 2022-10-22 DIAGNOSIS — M542 Cervicalgia: Secondary | ICD-10-CM | POA: Insufficient documentation

## 2022-10-22 DIAGNOSIS — J441 Chronic obstructive pulmonary disease with (acute) exacerbation: Secondary | ICD-10-CM | POA: Diagnosis not present

## 2022-10-22 DIAGNOSIS — C3431 Malignant neoplasm of lower lobe, right bronchus or lung: Secondary | ICD-10-CM | POA: Diagnosis not present

## 2022-10-22 DIAGNOSIS — E1165 Type 2 diabetes mellitus with hyperglycemia: Secondary | ICD-10-CM | POA: Diagnosis not present

## 2022-10-22 DIAGNOSIS — I10 Essential (primary) hypertension: Secondary | ICD-10-CM | POA: Diagnosis not present

## 2022-11-21 DIAGNOSIS — I10 Essential (primary) hypertension: Secondary | ICD-10-CM | POA: Diagnosis not present

## 2022-11-21 DIAGNOSIS — M109 Gout, unspecified: Secondary | ICD-10-CM | POA: Diagnosis not present

## 2022-12-22 DIAGNOSIS — I10 Essential (primary) hypertension: Secondary | ICD-10-CM | POA: Diagnosis not present

## 2022-12-22 DIAGNOSIS — J449 Chronic obstructive pulmonary disease, unspecified: Secondary | ICD-10-CM | POA: Diagnosis not present

## 2023-01-17 ENCOUNTER — Encounter (HOSPITAL_COMMUNITY): Payer: Self-pay | Admitting: Emergency Medicine

## 2023-01-17 ENCOUNTER — Emergency Department (HOSPITAL_COMMUNITY)
Admission: EM | Admit: 2023-01-17 | Discharge: 2023-01-17 | Disposition: A | Discharge status: Home / Self Care | Payer: No Typology Code available for payment source | Attending: Emergency Medicine | Admitting: Emergency Medicine

## 2023-01-17 DIAGNOSIS — X58XXXA Exposure to other specified factors, initial encounter: Secondary | ICD-10-CM | POA: Insufficient documentation

## 2023-01-17 DIAGNOSIS — T50901A Poisoning by unspecified drugs, medicaments and biological substances, accidental (unintentional), initial encounter: Secondary | ICD-10-CM

## 2023-01-17 DIAGNOSIS — Z794 Long term (current) use of insulin: Secondary | ICD-10-CM | POA: Insufficient documentation

## 2023-01-17 DIAGNOSIS — Z7984 Long term (current) use of oral hypoglycemic drugs: Secondary | ICD-10-CM | POA: Insufficient documentation

## 2023-01-17 DIAGNOSIS — E119 Type 2 diabetes mellitus without complications: Secondary | ICD-10-CM | POA: Insufficient documentation

## 2023-01-17 DIAGNOSIS — T450X1A Poisoning by antiallergic and antiemetic drugs, accidental (unintentional), initial encounter: Secondary | ICD-10-CM | POA: Insufficient documentation

## 2023-01-17 DIAGNOSIS — J449 Chronic obstructive pulmonary disease, unspecified: Secondary | ICD-10-CM | POA: Insufficient documentation

## 2023-01-17 LAB — CBC WITH DIFFERENTIAL/PLATELET
Abs Immature Granulocytes: 0.02 10*3/uL (ref 0.00–0.07)
Basophils Absolute: 0 10*3/uL (ref 0.0–0.1)
Basophils Relative: 0 %
Eosinophils Absolute: 0 10*3/uL (ref 0.0–0.5)
Eosinophils Relative: 0 %
HCT: 37.4 % — ABNORMAL LOW (ref 39.0–52.0)
Hemoglobin: 12 g/dL — ABNORMAL LOW (ref 13.0–17.0)
Immature Granulocytes: 1 %
Lymphocytes Relative: 21 %
Lymphs Abs: 0.8 10*3/uL (ref 0.7–4.0)
MCH: 31.6 pg (ref 26.0–34.0)
MCHC: 32.1 g/dL (ref 30.0–36.0)
MCV: 98.4 fL (ref 80.0–100.0)
Monocytes Absolute: 0.1 10*3/uL (ref 0.1–1.0)
Monocytes Relative: 2 %
Neutro Abs: 3 10*3/uL (ref 1.7–7.7)
Neutrophils Relative %: 76 %
Platelets: 176 10*3/uL (ref 150–400)
RBC: 3.8 MIL/uL — ABNORMAL LOW (ref 4.22–5.81)
RDW: 13 % (ref 11.5–15.5)
WBC: 3.9 10*3/uL — ABNORMAL LOW (ref 4.0–10.5)
nRBC: 0 % (ref 0.0–0.2)

## 2023-01-17 LAB — ETHANOL: Alcohol, Ethyl (B): <10 mg/dL (ref <10)

## 2023-01-17 LAB — COMPREHENSIVE METABOLIC PANEL
ALT: 15 U/L (ref 0–44)
AST: 28 U/L (ref 15–41)
Albumin: 3.7 g/dL (ref 3.5–5.0)
Alkaline Phosphatase: 75 U/L (ref 38–126)
Anion gap: 11 (ref 5–15)
BUN: 18 mg/dL (ref 8–23)
CO2: 20 mmol/L — ABNORMAL LOW (ref 22–32)
Calcium: 8.6 mg/dL — ABNORMAL LOW (ref 8.9–10.3)
Chloride: 106 mmol/L (ref 98–111)
Creatinine, Ser: 0.84 mg/dL (ref 0.61–1.24)
GFR, Estimated: >60 mL/min (ref >60)
Glucose, Bld: 233 mg/dL — ABNORMAL HIGH (ref 70–99)
Potassium: 4.7 mmol/L (ref 3.5–5.1)
Sodium: 137 mmol/L (ref 135–145)
Total Bilirubin: 0.3 mg/dL (ref 0.3–1.2)
Total Protein: 6.7 g/dL (ref 6.5–8.1)

## 2023-01-17 LAB — RAPID URINE DRUG SCREEN, HOSP PERFORMED
Amphetamines: NOT DETECTED
Barbiturates: NOT DETECTED
Benzodiazepines: NOT DETECTED
Cocaine: NOT DETECTED
Opiates: NOT DETECTED
Tetrahydrocannabinol: NOT DETECTED

## 2023-01-17 LAB — ACETAMINOPHEN LEVEL: Acetaminophen (Tylenol), Serum: <10 ug/mL — ABNORMAL LOW (ref 10–30)

## 2023-01-17 LAB — SALICYLATE LEVEL: Salicylate Lvl: <7 mg/dL — ABNORMAL LOW (ref 7.0–30.0)

## 2023-01-17 MED ORDER — SODIUM CHLORIDE 0.9 % IV BOLUS
1000.0000 mL | Freq: Once | INTRAVENOUS | Status: AC
  Administered 2023-01-17: 1000 mL via INTRAVENOUS

## 2023-01-17 MED ORDER — LACTATED RINGERS IV BOLUS
500.0000 mL | Freq: Once | INTRAVENOUS | Status: AC
  Administered 2023-01-17: 500 mL via INTRAVENOUS

## 2023-01-17 NOTE — ED Triage Notes (Signed)
Pt c/o accidental overdose of Loratadine 10mg  tablets this morning as he states "I didn't realize I took them I thought it was my other pills", states he is unsure of how many he took but he took them this morning and may have been around "10-15 tabs" by accident. Denies SI attempt and denies Hx of any SI attempts in the past

## 2023-01-17 NOTE — ED Notes (Signed)
Pt given crackers and water, able to tolerate both solids and liquids well

## 2023-01-17 NOTE — ED Provider Notes (Signed)
[facility] Provider Note   CSN: [id number] Arrival date & time: 01/17/23  0932     History  Chief Complaint  Patient presents with   Drug Overdose    Caleb [name] is a 88 y.o. male, hx of DMII, COPD, who presents to the ED 2/2 to accidentally taking 10-15 pills of 10mg  loratadine. He states he was reaching for his medications and thought it was all of his pills he lays out and took just the loratadine instead. Complains of dry mouth and feeling a bit lightheaded. Denies chest pain, SOB, headache, confusion, N/V. Denies SI/HI.     Home Medications Prior to Admission medications   Medication Sig Start Date End Date Taking? Authorizing Provider  allopurinol (ZYLOPRIM) 300 MG tablet Take 300 mg by mouth daily as needed (for gout).    [provider]  Ascorbic Acid (VITAMIN C) 500 MG tablet Take 500 mg by mouth 2 (two) times daily.    [provider]  cholecalciferol (VITAMIN D) 1000 UNITS tablet Take 4,000 Units by mouth daily.    [provider]  diltiazem (CARDIZEM SR) 120 MG 12 hr capsule TAKE 1 CAPSULE(120 MG) BY MOUTH TWICE DAILY 03/22/20   Corum, Rex Kras, MD  docusate sodium (COLACE) 100 MG capsule Take 300 mg by mouth 2 (two) times daily.    [provider]  ferrous sulfate 325 (65 FE) MG tablet Take 325 mg by mouth 2 (two) times daily with a meal.    [provider]  finasteride (PROSCAR) 5 MG tablet Take 5 mg by mouth daily.      [provider]  folic acid (FOLVITE) 1 MG tablet Take 1 mg by mouth daily.    [provider]  gabapentin (NEURONTIN) 100 MG capsule Take 200 mg by mouth at bedtime.    [provider]  galantamine (RAZADYNE) 8 MG tablet Take 8 mg by mouth 2 (two) times daily.    [provider]  guaiFENesin 200 MG tablet Take 400 mg by mouth 2 (two) times daily.    [provider]  insulin glargine (LANTUS SOLOSTAR) 100 UNIT/ML Solostar  Pen Take 30units SQ nightly 03/22/20   Corum, Rex Kras, MD  levothyroxine (SYNTHROID, LEVOTHROID) 150 MCG tablet Take 150 mcg by mouth daily before breakfast.    [provider]  linaclotide (LINZESS) 72 MCG capsule Take 1 capsule (72 mcg total) by mouth daily before breakfast. 08/05/18   Carlis Stable, NP  loratadine (CLARITIN) 10 MG tablet Take 10 mg by mouth daily.   Patient not taking: Reported on 02/22/2021    [provider]  magnesium oxide (MAG-OX) 400 MG tablet Take 400 mg by mouth 2 (two) times daily.     [provider]  meclizine (ANTIVERT) 25 MG tablet Take 25 mg by mouth 4 (four) times daily as needed for dizziness or nausea.    [provider]  metFORMIN (GLUCOPHAGE) 1000 MG tablet Take 1 tablet (1,000 mg total) by mouth 2 (two) times daily with a meal. 03/22/20   Corum, Rex Kras, MD  montelukast (SINGULAIR) 10 MG tablet Take 10 mg by mouth at bedtime.    [provider]  Multiple Vitamin (MULTIVITAMIN WITH MINERALS) TABS tablet Take 1 tablet by mouth daily.    [provider]  ondansetron (ZOFRAN) 4 MG tablet Take 4 mg by mouth every 8 (eight) hours as needed for nausea or vomiting.    [provider]  pantoprazole (PROTONIX) 40 MG tablet Take 40 mg by mouth 2 (two) times daily.    [provider]  rosuvastatin (CRESTOR) 40 MG tablet Take 20 mg by mouth at bedtime.     [provider]  tamsulosin (FLOMAX) 0.4 MG CAPS capsule Take 0.4 mg by mouth daily. Patient not taking: Reported on 02/22/2021    [provider]      Allergies    Levaquin [levofloxacin in d5w]    Review of Systems   Review of Systems  Constitutional:  Negative for fever.  HENT:  Negative for drooling.   Respiratory:  Negative for shortness of breath.   Cardiovascular:  Negative for chest pain.  Gastrointestinal:  Negative for diarrhea, nausea and vomiting.  Neurological:  Positive for light-headedness. Negative for headaches.     Physical Exam Updated Vital Signs BP (!) 146/81   Pulse 82   Temp 98.2 F (36.8 C) (Oral)   Resp (!) 21   SpO2 97%  Physical Exam Vitals and nursing note reviewed.  Constitutional:      General: He is not in acute distress.    Appearance: He is well-developed.  HENT:     Head: Normocephalic and atraumatic.     Mouth/Throat:     Mouth: Mucous membranes are dry.  Eyes:     Conjunctiva/sclera: Conjunctivae normal.  Cardiovascular:     Rate and Rhythm: Normal rate and regular rhythm.     Heart sounds: No murmur heard. Pulmonary:     Effort: Pulmonary effort is normal. No respiratory distress.     Breath sounds: Normal breath sounds.  Abdominal:     Palpations: Abdomen is soft.     Tenderness: There is no abdominal tenderness.  Musculoskeletal:        General: No swelling.     Cervical back: Neck supple.  Skin:    General: Skin is warm and dry.     Capillary Refill: Capillary refill takes less than 2 seconds.  Neurological:     Mental Status: He is alert.  Psychiatric:        Mood and Affect: Mood normal.     ED Results / Procedures / Treatments   Labs (all labs ordered are listed, but only abnormal results are displayed) Labs Reviewed  COMPREHENSIVE METABOLIC PANEL - Abnormal; Notable for the following components:      Result Value   CO2 20 (*)    Glucose, Bld 233 (*)    Calcium 8.6 (*)    All other components within normal limits  SALICYLATE LEVEL - Abnormal; Notable for the following components:   Salicylate Lvl <5.4 (*)    All other components within normal limits  ACETAMINOPHEN LEVEL - Abnormal; Notable for the following components:   Acetaminophen (Tylenol), Serum <10 (*)    All other components within normal limits  CBC WITH DIFFERENTIAL/PLATELET - Abnormal; Notable for the following components:   WBC 3.9 (*)    RBC 3.80 (*)    Hemoglobin 12.0 (*)    HCT 37.4 (*)    All other components within normal limits  ETHANOL  RAPID URINE DRUG SCREEN,  HOSP PERFORMED    EKG EKG Interpretation  Date/Time:  Thursday January 17 2023 09:45:47 EST Ventricular Rate:  78 PR Interval:  186 QRS Duration: 140 QT Interval:  432 QTC Calculation: 493 R Axis:   -14 Text Interpretation: Sinus rhythm Right bundle branch block Inferior infarct, old Abnormal lateral Q waves Confirmed by Sherwood Gambler [id number]) on 01/17/2023  9:47:49 AM  Radiology No results found.  Procedures Procedures    Medications Ordered in ED Medications  sodium chloride 0.9 % bolus 1,000 mL (0 mLs Intravenous Stopped 01/17/23 1107)  lactated ringers bolus 500 mL (0 mLs Intravenous Stopped 01/17/23 1228)    ED Course/ Medical Decision Making/ A&P   {                          Medical Decision Making Patient is a 43-year-old male, here for an unintentional ingestion of 10 to 1510 mg pills of loratadine.  He states that he feels a little lightheaded, denies any other symptoms.  We will obtain EKG, CBC, CMP, UDS, and ethanol for further evaluation.  He denies any SI/HI.  States that it was an accident, as he thought it was his regular pills.  I spoke with poison control, they recommend observation of 4 hours. Side effects include dry mouth, dizziness, vomiting, headache, tachycardia. They recommend supportive care.   Amount and/or Complexity of Data Reviewed Labs: ordered.    Details: Labs unremarkable ECG/medicine tests:  Decision-making details documented in ED Course. Discussion of management or test interpretation with external provider(s): Patient states the headache/lightheadedness is improved after fluids, states that he is feeling well, we observed him for 4 and half hours, and he had no complaints other than slight lightheadedness.  States he understands return precautions, and he was discharged after close monitoring.   Final Clinical Impression(s) / ED Diagnoses Final diagnoses:  Drug ingestion, accidental or unintentional, initial encounter    Rx / DC Orders ED  Discharge Orders     None         Francyne Arreaga, Si Gaul, PA 01/17/23 1337    Sherwood Gambler, MD 01/18/23 [phone]

## 2023-01-17 NOTE — Discharge Instructions (Addendum)
Please follow-up with your primary care doctor as needed.  Make sure you are drinking lots of fluids, for the next 24 hours, and make sure you are watching your pills so that you are taking the right.  Pills more carefully.  If you have any confusion, intractable nausea, vomiting, severe headache please return to the ER.

## 2023-01-22 DIAGNOSIS — J449 Chronic obstructive pulmonary disease, unspecified: Secondary | ICD-10-CM | POA: Diagnosis not present

## 2023-01-22 DIAGNOSIS — I10 Essential (primary) hypertension: Secondary | ICD-10-CM | POA: Diagnosis not present

## 2023-01-24 ENCOUNTER — Telehealth: Payer: Self-pay

## 2023-01-24 NOTE — Telephone Encounter (Signed)
        Patient  visited [facility] on 2/1   Telephone encounter attempt :  1st  A HIPAA compliant voice message was left requesting a return call.  Instructed patient to call back .    [facility] [phone] [address], [place], [place] [address] Phone: [phone] Email: [email or web address]

## 2023-01-25 ENCOUNTER — Telehealth: Payer: Self-pay

## 2023-01-25 NOTE — Telephone Encounter (Signed)
        Patient  visited [facility] on 2/1    Telephone encounter attempt :2nd    A HIPAA compliant voice message was left requesting a return call.  Instructed patient to call back.    [facility] [phone] [address], [place], [place] [address] Phone: [phone] Email: [email or web address]

## 2023-02-20 DIAGNOSIS — I10 Essential (primary) hypertension: Secondary | ICD-10-CM | POA: Diagnosis not present

## 2023-02-20 DIAGNOSIS — J449 Chronic obstructive pulmonary disease, unspecified: Secondary | ICD-10-CM | POA: Diagnosis not present

## 2023-02-25 ENCOUNTER — Institutional Professional Consult (permissible substitution): Payer: Non-veteran care | Admitting: Internal Medicine

## 2023-02-26 ENCOUNTER — Ambulatory Visit (INDEPENDENT_AMBULATORY_CARE_PROVIDER_SITE_OTHER): Payer: No Typology Code available for payment source | Admitting: Internal Medicine

## 2023-02-26 ENCOUNTER — Encounter: Payer: Self-pay | Admitting: Internal Medicine

## 2023-02-26 ENCOUNTER — Ambulatory Visit (HOSPITAL_COMMUNITY)
Admission: RE | Admit: 2023-02-26 | Discharge: 2023-02-26 | Disposition: A | Discharge status: Home / Self Care | Payer: No Typology Code available for payment source | Source: Ambulatory Visit | Attending: Internal Medicine | Admitting: Internal Medicine

## 2023-02-26 VITALS — BP 132/82 | HR 94 | Ht 67.0 in | Wt 180.8 lb

## 2023-02-26 DIAGNOSIS — R059 Cough, unspecified: Secondary | ICD-10-CM | POA: Diagnosis not present

## 2023-02-26 DIAGNOSIS — J9811 Atelectasis: Secondary | ICD-10-CM | POA: Diagnosis not present

## 2023-02-26 DIAGNOSIS — R911 Solitary pulmonary nodule: Secondary | ICD-10-CM | POA: Diagnosis not present

## 2023-02-26 DIAGNOSIS — J449 Chronic obstructive pulmonary disease, unspecified: Secondary | ICD-10-CM | POA: Diagnosis not present

## 2023-02-26 DIAGNOSIS — R0609 Other forms of dyspnea: Secondary | ICD-10-CM

## 2023-02-26 DIAGNOSIS — J439 Emphysema, unspecified: Secondary | ICD-10-CM | POA: Diagnosis not present

## 2023-02-26 NOTE — Patient Instructions (Addendum)
Only use your albuterol as a rescue medication to be used if you can't catch your breath by resting or doing a relaxed purse lip breathing pattern.  - The less you use it, the better it will work when you need it. - Ok to use up to 2 puffs  every 4 hours if you must but call for immediate appointment if use goes up over your usual need - Don't leave home without it !!  (think of it like the spare tire for your car)   Also  Ok to try albuterol x 2 puffs x 15 min before an activity (on alternating days)  that you know would usually make you short of breath and see if it makes any difference and if makes none then don't take albuterol after activity unless you can't catch your breath as this means it's the resting that helps, not the albuterol.  GERD (REFLUX)  is an extremely common cause of respiratory symptoms just like yours , many times with no obvious heartburn at all.    It can be treated with medication, but also with lifestyle changes including elevation of the head of your bed (ideally with 6 -8inch blocks under the headboard of your bed),  Smoking cessation, avoidance of late meals, excessive alcohol, and avoid fatty foods, chocolate, peppermint, colas, red wine, and acidic juices such as orange juice.  NO MINT OR MENTHOL PRODUCTS SO NO COUGH DROPS  USE SUGARLESS CANDY INSTEAD (Jolley ranchers or Stover's or Life Savers) or even ice chips will also do - the key is to swallow to prevent all throat clearing. NO OIL BASED VITAMINS - use powdered substitutes.  Avoid fish oil when coughing.   Make sure you check your oxygen saturation  AT  your highest level of activity (not after you stop)   to be sure it stays over 90% and adjust  02 flow upward to maintain this level if needed but remember to turn it back to previous settings when you stop (to conserve your supply).   Please remember to go to the lab department   for your tests - we will call you with the results when they are available.       Please remember to go to the  x-ray department  @  [facility] for your tests - we will call you with the results when they are available     Please schedule a follow up office visit in 6 weeks, call sooner if needed with all medications /inhalers/ solutions in hand so we can verify exactly what you are taking. This includes all medications from all doctors and over the counters

## 2023-02-26 NOTE — Progress Notes (Unsigned)
Caleb [name], male    DOB: 1935-12-03    MRN: [id number]   Brief patient profile:  21  yowm  quit smoking 1999 with reported TB 1958  referred to pulmonary clinic in [place]  02/26/2023 by [facility] for ? Copd    History of Present Illness  02/26/2023  Pulmonary/ 1st office eval/ [facility]  Chief Complaint  Patient presents with   Consult    [facility]   Dyspnea:  worse x one year/ rides scooter at grocery x 1 year  Cough: grinding upper airway/ worse at bedtime/min mucoid production   Sleep: electric bed 35 degrees x 1.5 y  SABA use: none  02: albuterol   No obvious day to day or daytime pattern/variability or assoc excess/ purulent sputum or mucus plugs or hemoptysis or cp or chest tightness, subjective wheeze or overt sinus or hb symptoms.   sleeping without nocturnal  or early am exacerbation  of respiratory  c/o's or need for noct saba. Also denies any obvious fluctuation of symptoms with weather or environmental changes or other aggravating or alleviating factors except as outlined above   No unusual exposure hx or h/o childhood pna/ asthma or knowledge of premature birth.  Current Allergies, Complete Past Medical History, Past Surgical History, Family History, and Social History were reviewed in [employer] record.  ROS  The following are not active complaints unless bolded Hoarseness, sore throat, dysphagia, dental problems, itching, sneezing,  nasal congestion or discharge of excess mucus or purulent secretions, ear ache,   fever, chills, sweats, unintended wt loss or wt gain, classically pleuritic or exertional cp,  orthopnea pnd or arm/hand swelling  or leg swelling, presyncope, palpitations, abdominal pain, anorexia, nausea, vomiting, diarrhea  or change in bowel habits or change in bladder habits, change in stools or change in urine, dysuria, hematuria,  rash, arthralgias, visual complaints, headache, numbness, weakness or ataxia or  problems with walking or coordination,  change in mood or  memory.             Past Medical History:  Diagnosis Date   Acute exacerbation of chronic bronchitis (HCC)    Anemia    BPH (benign prostatic hypertrophy)    CAD (coronary artery disease)    Diabetes mellitus without complication (HCC)    Diverticulitis    Diverticulosis    GERD (gastroesophageal reflux disease)    GI bleed    History of tuberculosis    Hyperlipidemia    Hyperthyroidism     Outpatient Medications Prior to Visit  Medication Sig Dispense Refill   allopurinol (ZYLOPRIM) 300 MG tablet Take 300 mg by mouth daily as needed (for gout).     Ascorbic Acid (VITAMIN C) 500 MG tablet Take 500 mg by mouth 2 (two) times daily.     cholecalciferol (VITAMIN D) 1000 UNITS tablet Take 4,000 Units by mouth daily.     diltiazem (CARDIZEM SR) 120 MG 12 hr capsule TAKE 1 CAPSULE(120 MG) BY MOUTH TWICE DAILY 180 capsule 0   docusate sodium (COLACE) 100 MG capsule Take 300 mg by mouth 2 (two) times daily.     ferrous sulfate 325 (65 FE) MG tablet Take 325 mg by mouth 2 (two) times daily with a meal.     finasteride (PROSCAR) 5 MG tablet Take 5 mg by mouth daily.       folic acid (FOLVITE) 1 MG tablet Take 1 mg by mouth daily.     gabapentin (  NEURONTIN) 100 MG capsule Take 200 mg by mouth at bedtime.     galantamine (RAZADYNE) 8 MG tablet Take 8 mg by mouth 2 (two) times daily.     guaiFENesin 200 MG tablet Take 400 mg by mouth 2 (two) times daily.     insulin glargine (LANTUS SOLOSTAR) 100 UNIT/ML Solostar Pen Take 30units SQ nightly 15 mL 5   levothyroxine (SYNTHROID, LEVOTHROID) 150 MCG tablet Take 150 mcg by mouth daily before breakfast.     linaclotide (LINZESS) 72 MCG capsule Take 1 capsule (72 mcg total) by mouth daily before breakfast. 30 capsule 5   loratadine (CLARITIN) 10 MG tablet Take 10 mg by mouth daily.     magnesium oxide (MAG-OX) 400 MG tablet Take 400 mg by mouth 2 (two) times daily.      meclizine (ANTIVERT)  25 MG tablet Take 25 mg by mouth 4 (four) times daily as needed for dizziness or nausea.     metFORMIN (GLUCOPHAGE) 1000 MG tablet Take 1 tablet (1,000 mg total) by mouth 2 (two) times daily with a meal. 60 tablet 5   montelukast (SINGULAIR) 10 MG tablet Take 10 mg by mouth at bedtime.     Multiple Vitamin (MULTIVITAMIN WITH MINERALS) TABS tablet Take 1 tablet by mouth daily.     ondansetron (ZOFRAN) 4 MG tablet Take 4 mg by mouth every 8 (eight) hours as needed for nausea or vomiting.     pantoprazole (PROTONIX) 40 MG tablet Take 40 mg by mouth 2 (two) times daily.     rosuvastatin (CRESTOR) 40 MG tablet Take 20 mg by mouth at bedtime.      tamsulosin (FLOMAX) 0.4 MG CAPS capsule Take 0.4 mg by mouth daily.     No facility-administered medications prior to visit.     Objective:     BP 132/82 (BP Location: Left Arm, Patient Position: Sitting)   Pulse 94   Ht '5\' 7"'$  (1.702 m)   Wt 180 lb 12.8 oz (82 kg)   SpO2 96% Comment: ra  BMI 28.32 kg/m   SpO2: 96 % (ra)  Chronically ill amb elderly wm -easily confused with details of care   HEENT : Oropharynx  clear, no top teetch  Nasal turbinates nl    NECK :  without  apparent JVD/ palpable Nodes/TM    LUNGS: no acc muscle use,  Min barrel  contour chest wall with bilateral  slightly decreased bs s audible wheeze and  without cough on insp or exp maneuvers and min  Hyperresonant  to  percussion bilaterally    CV:  RRR  no s3 1-2/6 sem s Increase in P2, and no edema   ABD:  soft and nontender with pos end  insp Hoover's  in the supine position.  No bruits or organomegaly appreciated   MS:  Nl gait/ ext warm without deformities Or obvious joint restrictions  calf tenderness, cyanosis -- POS mild/mod clubbing     SKIN: warm and dry without lesions    NEURO:  alert, approp, nl sensorium with  no motor or cerebellar deficits apparent.        CXR PA and Lateral:   02/26/2023 :    I personally reviewed images and agree with radiology  impression as follows:    Changes of COPD and chronic interstitial lung disease.  RIGHT basilar atelectasis.  Question new RIGHT perihilar nodule 3.1 x 1.8 cm;  Aortic Atherosclerosis (ICD10-I70.0) and Emphysema (ICD10-J43.9). My review:  nodule not seen 05/02/22 /  also mod T kyphosis      Assessment   DOE (dyspnea on exertion) Quit smoking 1999  - Spirometry 01/13/18 '@wt'$  198  FEV1 1.34 (47%)  Ratio 0.69 @ wt 198 with mildly concave F/V and no response to saba - 02/26/2023   Walked on RA  x  2  lap(s) =  approx 300  ft  @ slow/unsteady gait  stopped due to sob with lowest 02 sats 91%     Most likely this is GOLD 3 COPD and may benefit from lama/laba once we get a handle on what medications he is really taking so advised on approp use of saba:  - The proper method of use, as well as anticipated side effects, of a metered-dose inhaler were discussed and demonstrated to the patient using teach back method   Re SABA :  I spent extra time with pt today reviewing appropriate use of albuterol for prn use on exertion with the following points: 1) saba is for relief of sob that does not improve by walking a slower pace or resting but rather if the pt does not improve after trying this first. 2) If the pt is convinced, as many are, that saba helps recover from activity faster then it's easy to tell if this is the case by re-challenging : ie stop, take the inhaler, then p 5 minutes try the exact same activity (intensity of workload) that just caused the symptoms and see if they are substantially diminished or not after saba 3) if there is an activity that reproducibly causes the symptoms, try the saba 15 min before the activity on alternate days   If in fact the saba really does help, then fine to continue to use it prn but advised may need to look closer at the maintenance regimen (for now nothing) being used to achieve better control of airways disease with exertion.   Pulmonary nodule See cxr  02/26/2023 not seen on pa film from 05/02/22 > CT s contrast rec  Discussed in detail all the  indications, usual  risks and alternatives  relative to the benefits with patient who agrees to proceed with w/u as outlined.     Each maintenance medication was reviewed in detail including emphasizing most importantly the difference between maintenance and prns and under what circumstances the prns are to be triggered using an action plan format where appropriate.  Total time for H and P, chart review, counseling, reviewing hfa device(s) , directly observing portions of ambulatory 02 saturation study/ and generating customized AVS unique to this office visit / same day charting  > 60 min with pt new to me                   Christinia Gully, MD 02/26/2023

## 2023-02-27 ENCOUNTER — Telehealth: Payer: Self-pay

## 2023-02-27 ENCOUNTER — Telehealth: Payer: Self-pay | Admitting: Internal Medicine

## 2023-02-27 ENCOUNTER — Encounter: Payer: Self-pay | Admitting: Internal Medicine

## 2023-02-27 DIAGNOSIS — R911 Solitary pulmonary nodule: Secondary | ICD-10-CM | POA: Insufficient documentation

## 2023-02-27 NOTE — Assessment & Plan Note (Signed)
Quit smoking 1999  - Spirometry 01/13/18 '@wt'$  198  FEV1 1.34 (47%)  Ratio 0.69 @ wt 198 with mildly concave F/V and no response to saba - 02/26/2023   Walked on RA  x  2  lap(s) =  approx 300  ft  @ slow/unsteady gait  stopped due to sob with lowest 02 sats 91%     Most likely this is GOLD 3 COPD and may benefit from lama/laba once we get a handle on what medications he is really taking so advised on approp use of saba:  - The proper method of use, as well as anticipated side effects, of a metered-dose inhaler were discussed and demonstrated to the patient using teach back method   Re SABA :  I spent extra time with pt today reviewing appropriate use of albuterol for prn use on exertion with the following points: 1) saba is for relief of sob that does not improve by walking a slower pace or resting but rather if the pt does not improve after trying this first. 2) If the pt is convinced, as many are, that saba helps recover from activity faster then it's easy to tell if this is the case by re-challenging : ie stop, take the inhaler, then p 5 minutes try the exact same activity (intensity of workload) that just caused the symptoms and see if they are substantially diminished or not after saba 3) if there is an activity that reproducibly causes the symptoms, try the saba 15 min before the activity on alternate days   If in fact the saba really does help, then fine to continue to use it prn but advised may need to look closer at the maintenance regimen (for now nothing) being used to achieve better control of airways disease with exertion.

## 2023-02-27 NOTE — Telephone Encounter (Signed)
Call report on X-ray

## 2023-02-27 NOTE — Telephone Encounter (Signed)
See result note, nothing further needed

## 2023-02-27 NOTE — Telephone Encounter (Signed)
Dr. Melvyn Novas please review following CXR call report:   LINICAL DATA:  Cough, shortness of breath for 2-3 years, history asthma, former smoker   EXAM: CHEST - 2 VIEW   COMPARISON:  05/02/2022   FINDINGS: Normal heart size, mediastinal contours, and pulmonary vascularity.   Atherosclerotic calcification aorta.   Emphysematous and bronchitic changes consistent with COPD.   Chronic interstitial prominence again identified with slightly increased LEFT basilar atelectasis.   No acute infiltrate, pleural effusion, or pneumothorax.   Question new RIGHT perihilar nodular density 3.1 x 1.8 cm.   Bones demineralized.   IMPRESSION: Changes of COPD and chronic interstitial lung disease.   RIGHT basilar atelectasis.   Question new RIGHT perihilar nodule 3.1 x 1.8 cm; CT chest with contrast recommended to exclude pulmonary nodule.   Aortic Atherosclerosis (ICD10-I70.0) and Emphysema (ICD10-J43.9).   These results will be called to the ordering clinician or representative by the Radiologist Assistant, and communication documented in the PACS or [employer].     Electronically Signed   By: Lavonia Dana M.D.   On: 02/26/2023 17:18  Thank you

## 2023-02-27 NOTE — Telephone Encounter (Signed)
Noted  

## 2023-02-27 NOTE — Assessment & Plan Note (Signed)
See cxr 02/26/2023 not seen on pa film from 05/02/22 > CT s contrast rec  Discussed in detail all the  indications, usual  risks and alternatives  relative to the benefits with patient who agrees to proceed with w/u as outlined.     Each maintenance medication was reviewed in detail including emphasizing most importantly the difference between maintenance and prns and under what circumstances the prns are to be triggered using an action plan format where appropriate.  Total time for H and P, chart review, counseling, reviewing hfa device(s) , directly observing portions of ambulatory 02 saturation study/ and generating customized AVS unique to this office visit / same day charting  > 60 min with pt new to me

## 2023-02-28 ENCOUNTER — Other Ambulatory Visit: Payer: Self-pay

## 2023-02-28 DIAGNOSIS — R911 Solitary pulmonary nodule: Secondary | ICD-10-CM

## 2023-02-28 NOTE — Progress Notes (Signed)
CT ordered based on CXR results.

## 2023-03-04 LAB — CBC WITH DIFFERENTIAL/PLATELET
Basophils Absolute: 0 x10E3/uL (ref 0.0–0.2)
Basos: 1 %
EOS (ABSOLUTE): 0.1 x10E3/uL (ref 0.0–0.4)
Eos: 2 %
Hematocrit: 39.3 % (ref 37.5–51.0)
Hemoglobin: 12.8 g/dL — ABNORMAL LOW (ref 13.0–17.7)
Immature Grans (Abs): 0 x10E3/uL (ref 0.0–0.1)
Immature Granulocytes: 0 %
Lymphocytes Absolute: 1.8 x10E3/uL (ref 0.7–3.1)
Lymphs: 31 %
MCH: 31.2 pg (ref 26.6–33.0)
MCHC: 32.6 g/dL (ref 31.5–35.7)
MCV: 96 fL (ref 79–97)
Monocytes Absolute: 0.5 x10E3/uL (ref 0.1–0.9)
Monocytes: 8 %
Neutrophils Absolute: 3.4 x10E3/uL (ref 1.4–7.0)
Neutrophils: 58 %
Platelets: 209 x10E3/uL (ref 150–450)
RBC: 4.1 x10E6/uL — ABNORMAL LOW (ref 4.14–5.80)
RDW: 13.2 % (ref 11.6–15.4)
WBC: 5.8 x10E3/uL (ref 3.4–10.8)

## 2023-03-04 LAB — BASIC METABOLIC PANEL WITH GFR
BUN/Creatinine Ratio: 22 (ref 10–24)
BUN: 20 mg/dL (ref 8–27)
CO2: 22 mmol/L (ref 20–29)
Calcium: 9.4 mg/dL (ref 8.6–10.2)
Chloride: 103 mmol/L (ref 96–106)
Creatinine, Ser: 0.92 mg/dL (ref 0.76–1.27)
Glucose: 100 mg/dL — ABNORMAL HIGH (ref 70–99)
Potassium: 4.9 mmol/L (ref 3.5–5.2)
Sodium: 142 mmol/L (ref 134–144)
eGFR: 82 mL/min/{1.73_m2}

## 2023-03-04 LAB — TSH: TSH: 0.697 u[IU]/mL (ref 0.450–4.500)

## 2023-03-04 LAB — SEDIMENTATION RATE: Sed Rate: 14 mm/h (ref 0–30)

## 2023-03-04 LAB — ALPHA-1-ANTITRYPSIN PHENOTYP: A-1 Antitrypsin: 129 mg/dL (ref 101–187)

## 2023-03-04 LAB — BRAIN NATRIURETIC PEPTIDE: BNP: 20.4 pg/mL (ref 0.0–100.0)

## 2023-03-04 LAB — D-DIMER, QUANTITATIVE: D-DIMER: 0.49 mg{FEU}/L (ref 0.00–0.49)

## 2023-03-23 DIAGNOSIS — I10 Essential (primary) hypertension: Secondary | ICD-10-CM | POA: Diagnosis not present

## 2023-03-23 DIAGNOSIS — J449 Chronic obstructive pulmonary disease, unspecified: Secondary | ICD-10-CM | POA: Diagnosis not present

## 2023-03-31 NOTE — Progress Notes (Unsigned)
Caleb Caleb [name], male    DOB: 24-Jul-1935    MRN: [id number]  Brief patient profile:  37 yowm  MM/quit smoking 1999 with reported TB 1958  referred to pulmonary clinic in [place]  02/26/2023 by [facility] for ? Copd    History of Present Illness  02/26/2023  Pulmonary/ 1st Caleb [name] eval/ Caleb Caleb [name] / Rom[place] Caleb [name]  Chief Complaint  Patient presents with   Consult    [facility]   Dyspnea:  worse x one year/ rides scooter at grocery x 1 year  Cough: grinding upper airway/ worse at bedtime/min mucoid production   Sleep: electric bed 35 degrees x 1.5 y  SABA use: none  Rec Only use your albuterol as a rescue medicatio  Also  Ok to try albuterol x 2 puffs x 15 min before an activity (on alternating days)  that you know would usually make you short of breath Make sure you check your oxygen saturation  AT  your highest level of activity (not after you stop)   to be sure it stays over 90%   Cxr ? Pulm nodule > ct ordered    02/26/23 alpha one phenotype MM/ level 129 eos 0.1   Please schedule a follow up Caleb [name] visit in 6 weeks, call sooner if needed with all medications /inhalers/ solutions in hand   04/01/2023  f/u ov/Caleb Caleb [name]/Caleb Caleb [name] re: doe/ lung nodule  maint on lisinopril   Chief Complaint  Patient presents with   Follow-up   Dyspnea:  more limited by legs than breathing  Cough: better but still choking sensation esp hs on acei  Sleeping: 35 degrees SABA use: rarely  02: 2lpm        No obvious day to day or daytime variability or assoc excess/ purulent sputum or mucus plugs or hemoptysis or cp or chest tightness, subjective wheeze or overt sinus or hb symptoms.   Sleeping  without nocturnal  or early am exacerbation  of respiratory  c/o's or need for noct saba. Also denies any obvious fluctuation of symptoms with weather or environmental changes or other aggravating or alleviating factors except as outlined above   No unusual exposure hx or h/o childhood pna/ asthma or knowledge  of premature birth.  Current Allergies, Complete Past Medical History, Past Surgical History, Family History, and Social History were reviewed in [employer] record.  ROS  The following are not active complaints unless bolded Hoarseness, sore throat, dysphagia, dental problems, itching, sneezing,  nasal congestion or discharge of excess mucus or purulent secretions, ear ache,   fever, chills, sweats, unintended wt loss or wt gain, classically pleuritic or exertional cp,  orthopnea pnd or arm/hand swelling  or leg swelling, presyncope, palpitations, abdominal pain, anorexia, nausea, vomiting, diarrhea  or change in bowel habits or change in bladder habits, change in stools or change in urine, dysuria, hematuria,  rash, arthralgias, visual complaints, headache, numbness, weakness or ataxia or problems with walking or coordination,  change in mood or  memory.        Current Meds  Medication Sig   allopurinol (ZYLOPRIM) 300 MG tablet Take 300 mg by mouth daily as needed (for gout).   aspirin 81 MG chewable tablet Chew 81 mg by mouth daily.   finasteride (PROSCAR) 5 MG tablet Take 5 mg by mouth daily.     gabapentin (NEURONTIN) 100 MG capsule Take 200 mg by mouth at bedtime.   galantamine (RAZADYNE) 8 MG tablet Take 8 mg by mouth 2 (  two) times daily.   insulin glargine (LANTUS SOLOSTAR) 100 UNIT/ML Solostar Pen Take 30units SQ nightly   levothyroxine (SYNTHROID, LEVOTHROID) 150 MCG tablet Take 150 mcg by mouth daily before breakfast.   lisinopril (ZESTRIL) 5 MG tablet Take 5 mg by mouth daily.   loratadine (CLARITIN) 10 MG tablet Take 10 mg by mouth daily.   metFORMIN (GLUCOPHAGE) 1000 MG tablet Take 1 tablet (1,000 mg total) by mouth 2 (two) times daily with a meal.   oxybutynin (DITROPAN) 5 MG tablet Take 5 mg by mouth 2 (two) times daily.   pantoprazole (PROTONIX) 40 MG tablet Take 40 mg by mouth 2 (two) times daily.   pravastatin (PRAVACHOL) 10 MG tablet Take 10 mg by  mouth daily.   tamsulosin (FLOMAX) 0.4 MG CAPS capsule Take 0.4 mg by mouth daily.            Past Medical History:  Diagnosis Date   Acute exacerbation of chronic bronchitis (HCC)    Anemia    BPH (benign prostatic hypertrophy)    CAD (coronary artery disease)    Diabetes mellitus without complication (HCC)    Diverticulitis    Diverticulosis    GERD (gastroesophageal reflux disease)    GI bleed    History of tuberculosis    Hyperlipidemia    Hyperthyroidism        Objective:    Wts   Wt Readings from Last 3 Encounters:  04/01/23 180 lb 6.4 oz (81.8 kg)  02/26/23 180 lb 12.8 oz (82 kg)  02/22/21 178 lb (80.7 kg)      Vital signs reviewed  04/01/2023  - Note at rest 02 sats  90% on RA   General appearance:    frail elderly wm trouble standing /walks bent over     HEENT : Oropharynx  clear   Nasal turbinates nl    NECK :  without  apparent JVD/ palpable Nodes/TM    LUNGS: no acc muscle use,  Min barrel  contour chest wall with bilateral  slightly decreased bs s audible wheeze and  without cough on insp or exp maneuvers and min  Hyperresonant  to  percussion bilaterally    CV:  RRR  no s3 or murmur or increase in P2, and no edema   ABD:  soft and nontender with pos end  insp Hoover's  in the supine position.  No bruits or organomegaly appreciated   MS:  Nl gait/ ext warm without deformities Or obvious joint restrictions  calf tenderness, cyanosis - Pos Mild  clubbing     SKIN: warm and dry without lesions    NEURO:  alert, approp, nl sensorium with  no motor or cerebellar deficits apparent.                    Assessment

## 2023-04-01 ENCOUNTER — Ambulatory Visit (INDEPENDENT_AMBULATORY_CARE_PROVIDER_SITE_OTHER): Payer: No Typology Code available for payment source | Admitting: Internal Medicine

## 2023-04-01 ENCOUNTER — Encounter: Payer: Self-pay | Admitting: Internal Medicine

## 2023-04-01 VITALS — BP 103/50 | HR 92 | Ht 69.0 in | Wt 180.4 lb

## 2023-04-01 DIAGNOSIS — J449 Chronic obstructive pulmonary disease, unspecified: Secondary | ICD-10-CM | POA: Diagnosis not present

## 2023-04-01 DIAGNOSIS — I10 Essential (primary) hypertension: Secondary | ICD-10-CM | POA: Diagnosis not present

## 2023-04-01 DIAGNOSIS — R911 Solitary pulmonary nodule: Secondary | ICD-10-CM

## 2023-04-01 DIAGNOSIS — J9611 Chronic respiratory failure with hypoxia: Secondary | ICD-10-CM | POA: Diagnosis not present

## 2023-04-01 MED ORDER — VALSARTAN 80 MG PO TABS: 80.0000 mg | ORAL_TABLET | Freq: Every day | ORAL | 11 refills | Status: AC

## 2023-04-01 NOTE — Assessment & Plan Note (Signed)
Try off acei 04/01/2023 due to noct choking   ACE inhibitors are problematic in  pts with airway complaints because  even experienced pulmonologists can't always distinguish ace effects from copd/asthma.  By themselves they don't actually cause a problem, much like oxygen can't by itself start a fire, but they certainly serve as a powerful catalyst or enhancer for any "fire"  or inflammatory process in the upper airway, be it caused by an ET  tube or more commonly reflux (especially in the obese or pts with known GERD or who are on biphoshonates).    In the era of ARB near equivalency until we have a better handle on the reversibility of the airway problem, it just makes sense to avoid ACEI  entirely in the short run and then decide later, having established a level of airway control using a reasonable limited regimen, whether to add back ace but even then being very careful to observe the pt for worsening airway control and number of meds used/ needed to control symptoms.     >>> Try diovan 80 mg daily x 6 weeks then regroup

## 2023-04-01 NOTE — Patient Instructions (Addendum)
Stop lisinorpil and start diovan 80 mg (valsatan) one daily - call me if problems with it   Make sure you check your oxygen saturation  AT  your highest level of activity (not after you stop)   to be sure it stays over 90% and adjust  02 flow upward to maintain this level if needed but remember to turn it back to previous settings when you stop (to conserve your supply).   Please schedule a follow up office visit in 6 weeks, call sooner if needed

## 2023-04-01 NOTE — Assessment & Plan Note (Signed)
See cxr 02/26/2023 not seen on pa film from 05/02/22 > CT s contrast pending   Not a candidate for surgery but could tol SRT > proceed with ct as planned.

## 2023-04-01 NOTE — Assessment & Plan Note (Signed)
Quit smoking 1999 /MM - Spirometry 01/13/18 @wt  198  FEV1 1.34 (47%)  Ratio 0.69 @ wt 198 with mildly concave F/V and no response to saba - 02/26/2023   Walked on RA  x  2  lap(s) =  approx 300  ft  @ slow/unsteady gait  stopped due to sob with lowest 02 sats 91%    - 02/26/23 alpha one phenotype  MM/ level 129   eos 0.1   Pt is Group B in terms of symptom/risk and laba/lama therefore appropriate rx at this point >>>  stiolto  bevespi or anoro options but really not limited by doe at present so work on cough 1st then regoup in 6 weeks and in meantime has saba  Re SABA :  I spent extra time with pt today reviewing appropriate use of albuterol for prn use on exertion with the following points: 1) saba is for relief of sob that does not improve by walking a slower pace or resting but rather if the pt does not improve after trying this first. 2) If the pt is convinced, as many are, that saba helps recover from activity faster then it's easy to tell if this is the case by re-challenging : ie stop, take the inhaler, then p 5 minutes try the exact same activity (intensity of workload) that just caused the symptoms and see if they are substantially diminished or not after saba 3) if there is an activity that reproducibly causes the symptoms, try the saba 15 min before the activity on alternate days   If in fact the saba really does help, then fine to continue to use it prn but advised may need to look closer at the maintenance regimen being used to achieve better control of airways disease with exertion.

## 2023-04-01 NOTE — Assessment & Plan Note (Signed)
02 2lpm and prn daytime as of initial pulmonary eval 02/26/23  -02/26/2023   Walked on RA  x  2  lap(s) =  approx 300  ft  @ slow/unsteady gait  stopped due to sob with lowest 02 sats 91%   Rec: No change hs 2lplm  Make sure you check your oxygen saturation  AT  your highest level of activity (not after you stop)   to be sure it stays over 90% and adjust  02 flow upward to maintain this level if needed but remember to turn it back to previous settings when you stop (to conserve your supply).   F/u in 6 weeks, sooner if needed         Each maintenance medication was reviewed in detail including emphasizing most importantly the difference between maintenance and prns and under what circumstances the prns are to be triggered using an action plan format where appropriate.  Total time for H and P, chart review, counseling, reviewing 02 device(s) and generating customized AVS unique to this office visit / same day charting > 30 min for multiple  refractory respiratory  symptoms of uncertain etiology

## 2023-04-17 ENCOUNTER — Other Ambulatory Visit: Payer: Self-pay

## 2023-04-17 ENCOUNTER — Encounter (HOSPITAL_COMMUNITY): Payer: Self-pay | Admitting: Physical Therapy

## 2023-04-17 ENCOUNTER — Ambulatory Visit (HOSPITAL_COMMUNITY): Payer: No Typology Code available for payment source | Attending: Critical Care Medicine | Admitting: Physical Therapy

## 2023-04-17 DIAGNOSIS — M6281 Muscle weakness (generalized): Secondary | ICD-10-CM | POA: Insufficient documentation

## 2023-04-17 DIAGNOSIS — R29898 Other symptoms and signs involving the musculoskeletal system: Secondary | ICD-10-CM

## 2023-04-17 DIAGNOSIS — M25512 Pain in left shoulder: Secondary | ICD-10-CM

## 2023-04-17 NOTE — Therapy (Signed)
OUTPATIENT PHYSICAL THERAPY SHOULDER EVALUATION   Patient Name: Zekiel Torian MRN: [id number] DOB:[birth year], 88 y.o., male Today's Date: 04/17/2023  END OF SESSION:  PT End of Session - 04/17/23 1117     Visit Number 1    Number of Visits 8    Date for PT Re-Evaluation 06/12/23    [facility]ization Type [facility]    Authorization Time Period [facility] approval number [id number] [facility] approved 15 PT visits    Authorization - Visit Number 1    Authorization - Number of Visits 15    PT Start Time 1117    PT Stop Time 1157    PT Time Calculation (min) 40 min    Activity Tolerance Patient tolerated treatment well    Behavior During Therapy WFL for tasks assessed/performed             Past Medical History:  Diagnosis Date   Acute exacerbation of chronic bronchitis (HCC)    Anemia    BPH (benign prostatic hypertrophy)    CAD (coronary artery disease)    Diabetes mellitus without complication (HCC)    Diverticulitis    Diverticulosis    GERD (gastroesophageal reflux disease)    GI bleed    History of tuberculosis    Hyperlipidemia    Hyperthyroidism    Past Surgical History:  Procedure Laterality Date   CHOLECYSTECTOMY     COLONOSCOPY N/A 11/24/2015   Dr. Jena Gauss: pancolonic diverticulosis, polys removed and path consistent with adenomas   ERCP  09/2005   Dr. Karilyn Cota: biliary leak, ERCP with stent placement   ERCP  11/2005   Dr. Karilyn Cota: ERCP with removal of biliary stent and sphincterotomy    ESOPHAGOGASTRODUODENOSCOPY N/A 11/23/2015   Dr. Jena Gauss: normal esophagus, s/p dilation. duodenal diverticulum   GIVENS CAPSULE STUDY N/A 11/24/2015   unremarkable, however was incomplete. No obvious bleeding source   KNEE ARTHROSCOPY     THYROIDECTOMY     Patient Active Problem List   Diagnosis Date Noted   Chronic respiratory failure with hypoxia (HCC) 04/01/2023   Pulmonary nodule 02/27/2023   Memory loss 03/22/2020   Hypothyroidism 03/22/2020   Gout 03/22/2020   Abdominal pain 07/21/2018    Constipation 07/21/2018   GERD (gastroesophageal reflux disease) 07/21/2018   Ileus (HCC) 10/08/2016   Chest pain 10/08/2016   History of colonic polyps    Diverticulosis of colon without hemorrhage    Hiatal hernia    Duodenal diverticulum    Dysphagia    Melena    Sepsis (HCC) 11/20/2015   CAP (community acquired pneumonia) 06/29/2014   COPD GOLD 3/group B 06/29/2014   Diabetes mellitus without complication (HCC) 06/29/2014   Hemoptysis, non-massive currently 06/27/2014   Cough with hemoptysis 06/27/2014   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   Arthritis of wrist, degenerative 06/30/2013   Long term (current) use of anticoagulants 05/23/2011   ECZEMA HERPETICUM 09/13/2010   Hyperlipidemia 09/13/2010   Essential hypertension, benign 09/13/2010   Coronary atherosclerosis 09/13/2010   Atrial fibrillation (HCC) 09/13/2010   DOE (dyspnea on exertion) 09/13/2010    PCP: Lenn Sink  REFERRING PROVIDER: Mechele Dawley, MD  REFERRING DIAG: pain in lt shoulder M25.512  THERAPY DIAG:  Left shoulder pain, unspecified chronicity  Muscle weakness (generalized)  Other symptoms and signs involving the musculoskeletal system  Rationale for Evaluation and Treatment: Rehabilitation  ONSET DATE: 1 year  SUBJECTIVE:  SUBJECTIVE STATEMENT: Patient states L shoulder pain. Has gout in it. Started bothering him about a year. He has trouble with bending, lifting, laying. Shoulder is getting better. They gave him a shot for it. Did PT for it in the past. Uses cane sometimes for walking.  PERTINENT HISTORY:  COPD, HLD, GERD, PVD, DM, gout, HOH  PAIN:  Are you having pain? No Current 0/10; "halfway numb all the time"  PRECAUTIONS: Fall  WEIGHT BEARING RESTRICTIONS: No  FALLS:  Has patient  fallen in last 6 months? No   OCCUPATION: Retired  PLOF: Independent  PATIENT GOALS: Get that arm to stop hurting  NEXT MD VISIT: this week  OBJECTIVE:  PATIENT SURVEYS: FOTO 66% function  COGNITION: Overall cognitive status: Within functional limits for tasks assessed  SENSATION: WFL  POSTURE: rounded shoulders, forward head, and increased thoracic kyphosis  PALPATION: TTP L AC joint, biceps    UPPER EXTREMITY ROM:  Active ROM Right eval Left eval  Shoulder flexion 137 90  Shoulder extension    Shoulder abduction 160 125  Shoulder adduction    Shoulder extension    Shoulder internal rotation L1 Left lateral illiac crest  Shoulder external rotation T4 C6  Elbow flexion    Elbow extension    Wrist flexion    Wrist extension    Wrist ulnar deviation    Wrist radial deviation    Wrist pronation    Wrist supination     (Blank rows = not tested)  UPPER EXTREMITY MMT:  MMT Right eval Left eval  Shoulder flexion 5 4-*  Shoulder extension    Shoulder abduction 4+ 4-*  Shoulder adduction    Shoulder extension    Shoulder internal rotation 5 4+*  Shoulder external rotation 4+ 4+*  Middle trapezius    Lower trapezius    Elbow flexion 5 4+  Elbow extension 5 5  Wrist flexion    Wrist extension    Wrist ulnar deviation    Wrist radial deviation    Wrist pronation    Wrist supination    Grip strength     (Blank rows = not tested)  SHOULDER SPECIAL TESTS: difficulty assessing due to patient with difficulty relaxing   TODAY'S TREATMENT:                                                                                                                              DATE:  04/17/23 Table slides flexion 10 x 5 second holds Table slides abduction 10 x 5 second holds Seated scap retractions 2 x 10    PATIENT EDUCATION:  Education details: Patient educated on exam findings, POC, scope of PT, HEP. Person educated: Patient Education method: Explanation,  Demonstration, and Handouts Education comprehension: verbalized understanding, returned demonstration, verbal cues required, and tactile cues required  HOME EXERCISE PROGRAM: Access Code: [email or web address] URL: [email or web address] Date: 04/17/2023 - Seated Shoulder Flexion Towel Slide at Table Top Full Range of Motion  -  2-3 x daily - 7 x weekly - 1-2 sets - 10 reps - Seated Shoulder Abduction Towel Slide at Table Top with Forearm in Neutral  - 2-3 x daily - 7 x weekly - 1-2 sets - 10 reps - Seated Scapular Retraction  - 2-3 x daily - 7 x weekly - 2 sets - 10 reps  ASSESSMENT:  CLINICAL IMPRESSION: Patient a 88 y.o. y.o. male who was seen today for physical therapy evaluation and treatment for L shoulder pain. Patient presents with pain limited deficits in L shoulder strength, ROM, endurance, activity tolerance, and functional mobility with ADL. Patient is having to modify and restrict ADL as indicated by outcome measure score as well as subjective information and objective measures which is affecting overall participation. Patient will benefit from skilled physical therapy in order to improve function and reduce impairment.  OBJECTIVE IMPAIRMENTS: decreased activity tolerance, decreased balance, decreased endurance, decreased mobility, difficulty walking, decreased ROM, decreased strength, increased muscle spasms, impaired flexibility, impaired UE functional use, improper body mechanics, postural dysfunction, and pain.   ACTIVITY LIMITATIONS: carrying, lifting, bending, transfers, bathing, dressing, reach over head, hygiene/grooming, locomotion level, and caring for others  PARTICIPATION LIMITATIONS: meal prep, cleaning, laundry, shopping, community activity, and yard work  PERSONAL FACTORS: Age, Time since onset of injury/illness/exacerbation, and 3+ comorbidities: COPD, HLD, GERD, PVD, DM, gout, HOH  are also affecting patient's functional outcome.   REHAB POTENTIAL: Fair     CLINICAL DECISION MAKING: Stable/uncomplicated  EVALUATION COMPLEXITY: Low   GOALS: Goals reviewed with patient? Yes  SHORT TERM GOALS: Target date: 05/15/2023    Patient will be independent with HEP in order to improve functional outcomes. Baseline: Goal status: INITIAL  2.  Patient will report at least 25% improvement in symptoms for improved quality of life. Baseline:  Goal status: INITIAL   LONG TERM GOALS: Target date: 06/12/2023   Patient will report at least 75% improvement in symptoms for improved quality of life. Baseline:  Goal status: INITIAL  2.  Patient will improve FOTO score by at least 3 points in order to indicate improved tolerance to activity. Baseline: 69% function Goal status: INITIAL  3.  Patient will demonstrate at least 120 degrees L shoulder flexion AROM in order to improve overhead lifting ability to reach into cabinets. Baseline: see above Goal status: INITIAL  4.   Patient will demonstrate grade of 4+/5 MMT grade in all tested musculature as evidence of improved strength to assist with lifting. Baseline: see above Goal status: INITIAL     PLAN:  PT FREQUENCY: 1x/week  PT DURATION: 8 weeks  PLANNED INTERVENTIONS: Therapeutic exercises, Therapeutic activity, Neuromuscular re-education, Balance training, Gait training, Patient/Family education, Joint manipulation, Joint mobilization, Stair training, Orthotic/Fit training, DME instructions, Aquatic Therapy, Dry Needling, Electrical stimulation, Spinal manipulation, Spinal mobilization, Cryotherapy, Moist heat, Compression bandaging, scar mobilization, Splintting, Taping, Traction, Ultrasound, Ionotophoresis 4mg /ml Dexamethasone, and Manual therapy  PLAN FOR NEXT SESSION: shoulder mobility, shoulder/RC/periscap strength   Reola Mosher Kamsiyochukwu Spickler, PT 04/17/2023, 12:08 PM

## 2023-04-23 DIAGNOSIS — N400 Enlarged prostate without lower urinary tract symptoms: Secondary | ICD-10-CM | POA: Diagnosis not present

## 2023-04-23 DIAGNOSIS — Z1331 Encounter for screening for depression: Secondary | ICD-10-CM | POA: Diagnosis not present

## 2023-04-23 DIAGNOSIS — J449 Chronic obstructive pulmonary disease, unspecified: Secondary | ICD-10-CM | POA: Diagnosis not present

## 2023-04-23 DIAGNOSIS — I700 Atherosclerosis of aorta: Secondary | ICD-10-CM | POA: Diagnosis not present

## 2023-04-23 DIAGNOSIS — C3431 Malignant neoplasm of lower lobe, right bronchus or lung: Secondary | ICD-10-CM | POA: Diagnosis not present

## 2023-04-23 DIAGNOSIS — E039 Hypothyroidism, unspecified: Secondary | ICD-10-CM | POA: Diagnosis not present

## 2023-04-23 DIAGNOSIS — Z0001 Encounter for general adult medical examination with abnormal findings: Secondary | ICD-10-CM | POA: Diagnosis not present

## 2023-04-23 DIAGNOSIS — Z1389 Encounter for screening for other disorder: Secondary | ICD-10-CM | POA: Diagnosis not present

## 2023-04-23 DIAGNOSIS — I10 Essential (primary) hypertension: Secondary | ICD-10-CM | POA: Diagnosis not present

## 2023-04-23 DIAGNOSIS — E1165 Type 2 diabetes mellitus with hyperglycemia: Secondary | ICD-10-CM | POA: Diagnosis not present

## 2023-04-24 ENCOUNTER — Encounter (HOSPITAL_COMMUNITY): Payer: Non-veteran care | Admitting: Physical Therapy

## 2023-04-25 ENCOUNTER — Ambulatory Visit (HOSPITAL_COMMUNITY)
Admission: RE | Admit: 2023-04-25 | Discharge: 2023-04-25 | Disposition: A | Discharge status: Home / Self Care | Payer: No Typology Code available for payment source | Source: Ambulatory Visit | Attending: Internal Medicine | Admitting: Internal Medicine

## 2023-04-25 DIAGNOSIS — R911 Solitary pulmonary nodule: Secondary | ICD-10-CM | POA: Insufficient documentation

## 2023-04-25 DIAGNOSIS — J439 Emphysema, unspecified: Secondary | ICD-10-CM | POA: Diagnosis not present

## 2023-04-25 DIAGNOSIS — J479 Bronchiectasis, uncomplicated: Secondary | ICD-10-CM | POA: Diagnosis not present

## 2023-04-25 DIAGNOSIS — R918 Other nonspecific abnormal finding of lung field: Secondary | ICD-10-CM | POA: Diagnosis not present

## 2023-04-29 ENCOUNTER — Telehealth: Payer: Self-pay | Admitting: Internal Medicine

## 2023-04-30 ENCOUNTER — Encounter (HOSPITAL_COMMUNITY): Payer: Non-veteran care | Admitting: Physical Therapy

## 2023-04-30 ENCOUNTER — Telehealth (HOSPITAL_COMMUNITY): Payer: Self-pay | Admitting: Physical Therapy

## 2023-04-30 NOTE — Telephone Encounter (Signed)
Pt did not show for appt.  Called number and spoke to spouse who states that appt "wasn't written down".  Reminded of next appt scheduled on 5/22 and states they will be here for that one.  Lurena Nida, PTA/CLT [facility] Ph: [phone]

## 2023-05-08 ENCOUNTER — Other Ambulatory Visit: Payer: Self-pay

## 2023-05-08 ENCOUNTER — Ambulatory Visit (HOSPITAL_COMMUNITY): Payer: No Typology Code available for payment source | Admitting: Physical Therapy

## 2023-05-08 DIAGNOSIS — M25512 Pain in left shoulder: Secondary | ICD-10-CM

## 2023-05-08 DIAGNOSIS — M6281 Muscle weakness (generalized): Secondary | ICD-10-CM

## 2023-05-08 DIAGNOSIS — J9611 Chronic respiratory failure with hypoxia: Secondary | ICD-10-CM

## 2023-05-08 DIAGNOSIS — R29898 Other symptoms and signs involving the musculoskeletal system: Secondary | ICD-10-CM

## 2023-05-08 MED ORDER — AMOXICILLIN-POT CLAVULANATE 875-125 MG PO TABS: 1.0000 | ORAL_TABLET | Freq: Two times a day (BID) | ORAL | 0 refills | Status: DC

## 2023-05-08 NOTE — Therapy (Signed)
OUTPATIENT PHYSICAL THERAPY TREATMENT   Patient Name: Caleb [name] MRN: [id number] DOB:1935-06-04, 88 y.o., male Today's Date: 05/08/2023  END OF SESSION:  PT End of Session - 05/08/23 1123     Visit Number 2    Number of Visits 8    Date for PT Re-Evaluation 06/12/23    Authorization Type [facility]    Authorization Time Period VA approval number [id number] VA approved 15 PT visits    Authorization - Visit Number 2    Authorization - Number of Visits 15    PT Start Time 1115    PT Stop Time 1148    PT Time Calculation (min) 33 min    Activity Tolerance Patient tolerated treatment well    Behavior During Therapy WFL for tasks assessed/performed             Past Medical History:  Diagnosis Date   Acute exacerbation of chronic bronchitis (HCC)    Anemia    BPH (benign prostatic hypertrophy)    CAD (coronary artery disease)    Diabetes mellitus without complication (HCC)    Diverticulitis    Diverticulosis    GERD (gastroesophageal reflux disease)    GI bleed    History of tuberculosis    Hyperlipidemia    Hyperthyroidism    Past Surgical History:  Procedure Laterality Date   CHOLECYSTECTOMY     COLONOSCOPY N/A 11/24/2015   Dr. Jena Gauss: pancolonic diverticulosis, polys removed and path consistent with adenomas   ERCP  09/2005   Dr. Karilyn Cota: biliary leak, ERCP with stent placement   ERCP  11/2005   Dr. Karilyn Cota: ERCP with removal of biliary stent and sphincterotomy    ESOPHAGOGASTRODUODENOSCOPY N/A 11/23/2015   Dr. Jena Gauss: normal esophagus, s/p dilation. duodenal diverticulum   GIVENS CAPSULE STUDY N/A 11/24/2015   unremarkable, however was incomplete. No obvious bleeding source   KNEE ARTHROSCOPY     THYROIDECTOMY     Patient Active Problem List   Diagnosis Date Noted   Chronic respiratory failure with hypoxia (HCC) 04/01/2023   Pulmonary nodule 02/27/2023   Memory loss 03/22/2020   Hypothyroidism 03/22/2020   Gout 03/22/2020   Abdominal pain 07/21/2018    Constipation 07/21/2018   GERD (gastroesophageal reflux disease) 07/21/2018   Ileus (HCC) 10/08/2016   Chest pain 10/08/2016   History of colonic polyps    Diverticulosis of colon without hemorrhage    Hiatal hernia    Duodenal diverticulum    Dysphagia    Melena    Sepsis (HCC) 11/20/2015   CAP (community acquired pneumonia) 06/29/2014   COPD GOLD 3/group B 06/29/2014   Diabetes mellitus without complication (HCC) 06/29/2014   Hemoptysis, non-massive currently 06/27/2014   Cough with hemoptysis 06/27/2014   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   Arthritis of wrist, degenerative 06/30/2013   Long term (current) use of anticoagulants 05/23/2011   ECZEMA HERPETICUM 09/13/2010   Hyperlipidemia 09/13/2010   Essential hypertension, benign 09/13/2010   Coronary atherosclerosis 09/13/2010   Atrial fibrillation (HCC) 09/13/2010   DOE (dyspnea on exertion) 09/13/2010    PCP: Lenn Sink  REFERRING PROVIDER: Mechele Dawley, MD  REFERRING DIAG: pain in lt shoulder M25.512  THERAPY DIAG:  Left shoulder pain, unspecified chronicity  Muscle weakness (generalized)  Other symptoms and signs involving the musculoskeletal system  Rationale for Evaluation and Treatment: Rehabilitation  ONSET DATE: 1 year  SUBJECTIVE:  SUBJECTIVE STATEMENT: Pt did not show last appt as wife reports they were unaware of appt.  Pt comes today reporting overall improvement, compliance with HEP and no pain.    Evaluation: Patient states L shoulder pain. Has gout in it. Started bothering him about a year. He has trouble with bending, lifting, laying. Shoulder is getting better. They gave him a shot for it. Did PT for it in the past. Uses cane sometimes for walking.  PERTINENT HISTORY:  COPD, HLD, GERD, PVD, DM,  gout, HOH  PAIN:  Are you having pain? No Current 0/10; "halfway numb all the time"  PRECAUTIONS: Fall  WEIGHT BEARING RESTRICTIONS: No  FALLS:  Has patient fallen in last 6 months? No   OCCUPATION: Retired  PLOF: Independent  PATIENT GOALS: Get that arm to stop hurting  NEXT MD VISIT: this week  OBJECTIVE:  PATIENT SURVEYS: FOTO 66% function  COGNITION: Overall cognitive status: Within functional limits for tasks assessed  SENSATION: WFL  POSTURE: rounded shoulders, forward head, and increased thoracic kyphosis  PALPATION: TTP L AC joint, biceps    UPPER EXTREMITY ROM:  Active ROM Right eval Left eval Left 05/08/23  Shoulder flexion 137 90 135  Shoulder extension     Shoulder abduction 160 125 125  Shoulder adduction     Shoulder extension     Shoulder internal rotation L1 Left lateral illiac crest 90 supine in 90 degrees abduction  Shoulder external rotation T4 C6 55 supine in 90 degrees abduction  Elbow flexion     Elbow extension     Wrist flexion     Wrist extension     Wrist ulnar deviation     Wrist radial deviation     Wrist pronation     Wrist supination      (Blank rows = not tested)  UPPER EXTREMITY MMT:  MMT Right eval Left eval  Shoulder flexion 5 4-*  Shoulder extension    Shoulder abduction 4+ 4-*  Shoulder adduction    Shoulder extension    Shoulder internal rotation 5 4+*  Shoulder external rotation 4+ 4+*  Middle trapezius    Lower trapezius    Elbow flexion 5 4+  Elbow extension 5 5  Wrist flexion    Wrist extension    Wrist ulnar deviation    Wrist radial deviation    Wrist pronation    Wrist supination    Grip strength     (Blank rows = not tested)  SHOULDER SPECIAL TESTS: difficulty assessing due to patient with difficulty relaxing   TODAY'S TREATMENT:                                                                                                                              DATE:  05/08/23 Goal review   Pulleys 2' flexion, 2' abduction Standing:  UE flexion against wall 10X  Lt UE wall slides facing wall for flexion 10X Seated: scapular retractions 10X  UE flexion 10X  Abduction 10X  Shoulder rolls/shrugs 10X each Supine: 1# cane flexion, abduction, ER 10X each for LT UE PROM Lt shoulder all motions   04/17/23 Table slides flexion 10 x 5 second holds Table slides abduction 10 x 5 second holds Seated scap retractions 2 x 10    PATIENT EDUCATION:  Education details: Patient educated on exam findings, POC, scope of PT, HEP. Person educated: Patient Education method: Explanation, Demonstration, and Handouts Education comprehension: verbalized understanding, returned demonstration, verbal cues required, and tactile cues required  HOME EXERCISE PROGRAM: Access Code: [email or web address] URL: [email or web address] Date: 04/17/2023 - Seated Shoulder Flexion Towel Slide at Table Top Full Range of Motion  - 2-3 x daily - 7 x weekly - 1-2 sets - 10 reps - Seated Shoulder Abduction Towel Slide at Table Top with Forearm in Neutral  - 2-3 x daily - 7 x weekly - 1-2 sets - 10 reps - Seated Scapular Retraction  - 2-3 x daily - 7 x weekly - 2 sets - 10 reps  ASSESSMENT:  CLINICAL IMPRESSION: Goals reviewed and POC moving forward.  Pt has already met short term goals and progressing well toward Long term goals.  Noted improvement in Lt UE flexion and rotation.  Abduction remains most limited and unchanged from eval.  Began session with pulleys and progressed ROM both in standing and supine using cane for AAROM.  Discussed instability with patient as he "furniture" walks with several LOB and difficulty standing from chair.  Recommended he use a walker or at least a cane as he reaches outside BOS to find stability which could lead to a fall.  Instructed with repeated sit to stands he could do at home to improve LE strength and ability to rise from chair.  Patient will continue to benefit from skilled  physical therapy in order to improve function and reduce impairment.  OBJECTIVE IMPAIRMENTS: decreased activity tolerance, decreased balance, decreased endurance, decreased mobility, difficulty walking, decreased ROM, decreased strength, increased muscle spasms, impaired flexibility, impaired UE functional use, improper body mechanics, postural dysfunction, and pain.   ACTIVITY LIMITATIONS: carrying, lifting, bending, transfers, bathing, dressing, reach over head, hygiene/grooming, locomotion level, and caring for others  PARTICIPATION LIMITATIONS: meal prep, cleaning, laundry, shopping, community activity, and yard work  PERSONAL FACTORS: Age, Time since onset of injury/illness/exacerbation, and 3+ comorbidities: COPD, HLD, GERD, PVD, DM, gout, HOH  are also affecting patient's functional outcome.   REHAB POTENTIAL: Fair    CLINICAL DECISION MAKING: Stable/uncomplicated  EVALUATION COMPLEXITY: Low   GOALS: Goals reviewed with patient? Yes  SHORT TERM GOALS: Target date: 05/15/2023    Patient will be independent with HEP in order to improve functional outcomes. Baseline: Goal status: MET  2.  Patient will report at least 25% improvement in symptoms for improved quality of life. Baseline:  Goal status: MET   LONG TERM GOALS: Target date: 06/12/2023   Patient will report at least 75% improvement in symptoms for improved quality of life. Baseline:  Goal status: IN PROGRESS  2.  Patient will improve FOTO score by at least 3 points in order to indicate improved tolerance to activity. Baseline: 69% function Goal status: IN PROGRESS  3.  Patient will demonstrate at least 120 degrees L shoulder flexion AROM in order to improve overhead lifting ability to reach into cabinets. Baseline: see above Goal status: IN PROGRESS  4.   Patient will demonstrate grade of 4+/5 MMT grade in all tested musculature as evidence of improved strength to assist  with lifting. Baseline: see above Goal  status: IN PROGRESS     PLAN:  PT FREQUENCY: 1x/week  PT DURATION: 8 weeks  PLANNED INTERVENTIONS: Therapeutic exercises, Therapeutic activity, Neuromuscular re-education, Balance training, Gait training, Patient/Family education, Joint manipulation, Joint mobilization, Stair training, Orthotic/Fit training, DME instructions, Aquatic Therapy, Dry Needling, Electrical stimulation, Spinal manipulation, Spinal mobilization, Cryotherapy, Moist heat, Compression bandaging, scar mobilization, Splintting, Taping, Traction, Ultrasound, Ionotophoresis 4mg /ml Dexamethasone, and Manual therapy  PLAN FOR NEXT SESSION: shoulder mobility, shoulder/RC/periscap strength.   Lurena Nida, PTA/CLT [facility] Ph: [phone]  Lurena Nida, PTA 05/08/2023, 12:44 PM

## 2023-05-14 ENCOUNTER — Encounter (HOSPITAL_COMMUNITY): Payer: Non-veteran care | Admitting: Physical Therapy

## 2023-05-14 ENCOUNTER — Telehealth (HOSPITAL_COMMUNITY): Payer: Self-pay | Admitting: Physical Therapy

## 2023-05-14 NOTE — Telephone Encounter (Signed)
Second NS today.  Left voicemail regarding attendance policy and cancelled all appts except next one.  Informed he would have to make one at a time.  Lurena Nida, PTA/CLT [facility] Ph: [phone]

## 2023-05-16 ENCOUNTER — Ambulatory Visit (INDEPENDENT_AMBULATORY_CARE_PROVIDER_SITE_OTHER): Payer: No Typology Code available for payment source | Admitting: Internal Medicine

## 2023-05-16 ENCOUNTER — Encounter: Payer: Self-pay | Admitting: Internal Medicine

## 2023-05-16 ENCOUNTER — Ambulatory Visit (INDEPENDENT_AMBULATORY_CARE_PROVIDER_SITE_OTHER): Payer: No Typology Code available for payment source

## 2023-05-16 ENCOUNTER — Institutional Professional Consult (permissible substitution): Payer: Non-veteran care | Admitting: Internal Medicine

## 2023-05-16 VITALS — BP 122/60 | HR 94 | Temp 97.5°F | Ht 68.0 in | Wt 182.0 lb

## 2023-05-16 DIAGNOSIS — R0609 Other forms of dyspnea: Secondary | ICD-10-CM

## 2023-05-16 DIAGNOSIS — I10 Essential (primary) hypertension: Secondary | ICD-10-CM

## 2023-05-16 DIAGNOSIS — J9611 Chronic respiratory failure with hypoxia: Secondary | ICD-10-CM

## 2023-05-16 DIAGNOSIS — R911 Solitary pulmonary nodule: Secondary | ICD-10-CM | POA: Diagnosis not present

## 2023-05-16 DIAGNOSIS — J449 Chronic obstructive pulmonary disease, unspecified: Secondary | ICD-10-CM | POA: Diagnosis not present

## 2023-05-16 DIAGNOSIS — R059 Cough, unspecified: Secondary | ICD-10-CM | POA: Diagnosis not present

## 2023-05-16 MED ORDER — PREDNISONE 10 MG PO TABS: ORAL_TABLET | ORAL | 0 refills | Status: DC

## 2023-05-16 MED ORDER — ALBUTEROL SULFATE (2.5 MG/3ML) 0.083% IN NEBU: 2.5000 mg | INHALATION_SOLUTION | Freq: Four times a day (QID) | RESPIRATORY_TRACT | 11 refills | Status: AC

## 2023-05-16 MED ORDER — BUDESONIDE 0.25 MG/2ML IN SUSP: RESPIRATORY_TRACT | 12 refills | Status: AC

## 2023-05-16 NOTE — Progress Notes (Signed)
Caleb [name], male    DOB: [birth year]    MRN: [id number]  Brief patient profile:  50 yowm  MM/quit smoking 1999 with reported TB 1958  referred to pulmonary clinic in [place]  02/26/2023 by [facility] for ? Copd    S/p SBRT [facility] completed 03/29/21 (had refused bx and was empirically radiated with curative intent    History of Present Illness  02/26/2023  Pulmonary/ 1st office eval/ Caleb [name] / [place] Office  Chief Complaint  Patient presents with   Consult    [facility]   Dyspnea:  worse x one year/ rides scooter at grocery x 1 year  Cough: grinding upper airway/ worse at bedtime/min mucoid production   Sleep: electric bed 35 degrees x 1.5 y  SABA use: none  Rec Only use your albuterol as a rescue medicatio  Also  Ok to try albuterol x 2 puffs x 15 min before an activity (on alternating days)  that you know would usually make you short of breath Make sure you check your oxygen saturation  AT  your highest level of activity (not after you stop)   to be sure it stays over 90%   Cxr ? Pulm nodule > ct ordered    02/26/23 alpha one phenotype MM/ level 129 eos 0.1   Please schedule a follow up office visit in 6 weeks, call sooner if needed with all medications /inhalers/ solutions in hand   04/01/2023  f/u ov/[place] office/Caleb [name] re: doe/ lung nodule  maint on lisinopril   Chief Complaint  Patient presents with   Follow-up   Dyspnea:  more limited by legs than breathing  Cough: better but still choking sensation esp hs on acei  Sleeping: 35 degrees hob SABA use: rarely  02: 2lpm  Rec Stop lisinorpil and start diovan 80 mg (valsatan) one daily - call me if problems with it  Make sure you check your oxygen saturation  AT  your highest level of activity  Please schedule a follow up office visit in 6 weeks, call sooner if needed    05/16/2023  f/u ov/Caleb [name] re: doe/cough/ abn ct    maint on saba prn  Chief Complaint  Patient presents with   Consult    CT chest 04/25/23/Consult for  [facility] (Dr. Rolla [name])  Dyspnea:  still limited 50 ft at most, can't do mailbox  Cough: minimal grey s blood/ no pain with coughing / no more choking spells off acei  Sleeping: 35 degrees x  2-3 years can't breath flat  SABA use: hfa up to every 4 hours but only using twice daily  02: 2lpm hs    No obvious day to day or daytime variability or assoc   mucus plugs or hemoptysis or cp or chest tightness, subjective wheeze or overt sinus or hb symptoms.    Also denies any obvious fluctuation of symptoms with weather or environmental changes or other aggravating or alleviating factors except as outlined above   No unusual exposure hx or h/o childhood pna/ asthma or knowledge of premature birth.  Current Allergies, Complete Past Medical History, Past Surgical History, Family History, and Social History were reviewed in [employer] record.  ROS  The following are not active complaints unless bolded Hoarseness, sore throat, dysphagia, dental problems, itching, sneezing,  nasal congestion or discharge of excess mucus or purulent secretions, ear ache,   fever, chills, sweats, unintended wt loss or wt gain, classically pleuritic or exertional  cp,  orthopnea pnd or arm/hand swelling  or leg swelling, presyncope, palpitations, abdominal pain, anorexia, nausea, vomiting, diarrhea  or change in bowel habits or change in bladder habits, change in stools or change in urine, dysuria, hematuria,  rash, arthralgias, visual complaints, headache, numbness, weakness or ataxia or problems with walking or coordination,  change in mood or  memory.        Current Meds  Medication Sig   albuterol (PROVENTIL) (2.5 MG/3ML) 0.083% nebulizer solution Take 2.5 mg by nebulization every 6 (six) hours as needed for wheezing or shortness of breath.   albuterol (VENTOLIN HFA) 108 (90 Base) MCG/ACT inhaler Inhale 2 puffs into the lungs every 6 (six) hours as needed for wheezing or  shortness of breath.   allopurinol (ZYLOPRIM) 300 MG tablet Take 300 mg by mouth daily as needed (for gout).   amoxicillin-clavulanate (AUGMENTIN) 875-125 MG tablet Has not started    Ascorbic Acid 500 MG CAPS Take 1 tablet by mouth daily.   aspirin 81 MG chewable tablet Chew 81 mg by mouth daily.   carboxymethylcellulose 1 % ophthalmic solution INSTILL 1 DROP IN BOTH EYES FOUR TIMES A DAY   Cholecalciferol 250 MCG (10000 UT) CAPS TAKE ONE TABLET BY MOUTH DAILY FOR VITAMIN D SUPPLEMENT   ferrous sulfate 325 (65 FE) MG tablet Take 1 tablet by mouth daily.   finasteride (PROSCAR) 5 MG tablet Take 5 mg by mouth daily.     folic acid (FOLVITE) 1 MG tablet Take 1 mg by mouth daily.   gabapentin (NEURONTIN) 100 MG capsule Take 200 mg by mouth at bedtime.   galantamine (RAZADYNE) 8 MG tablet Take 8 mg by mouth 2 (two) times daily.   insulin glargine (LANTUS SOLOSTAR) 100 UNIT/ML Solostar Pen Take 30units SQ nightly   levothyroxine (SYNTHROID, LEVOTHROID) 150 MCG tablet Take 150 mcg by mouth daily before breakfast.   loratadine (CLARITIN) 10 MG tablet Take 10 mg by mouth daily.   metFORMIN (GLUCOPHAGE) 1000 MG tablet Take 1 tablet (1,000 mg total) by mouth 2 (two) times daily with a meal.   oxybutynin (DITROPAN) 5 MG tablet Take 5 mg by mouth 2 (two) times daily.   pantoprazole (PROTONIX) 40 MG tablet Take 40 mg by mouth 2 (two) times daily.   pravastatin (PRAVACHOL) 10 MG tablet Take 10 mg by mouth daily.   tamsulosin (FLOMAX) 0.4 MG CAPS capsule Take 0.4 mg by mouth daily.   valsartan (DIOVAN) 80 MG tablet Take 1 tablet (80 mg total) by mouth daily.               Past Medical History:  Diagnosis Date   Acute exacerbation of chronic bronchitis (HCC)    Anemia    BPH (benign prostatic hypertrophy)    CAD (coronary artery disease)    Diabetes mellitus without complication (HCC)    Diverticulitis    Diverticulosis    GERD (gastroesophageal reflux disease)    GI bleed    History of  tuberculosis    Hyperlipidemia    Hyperthyroidism        Objective:    Wts  05/16/2023       182  04/01/23 180 lb 6.4 oz (81.8 kg)  02/26/23 180 lb 12.8 oz (82 kg)  02/22/21 178 lb (80.7 kg)    Vital signs reviewed  05/16/2023  - Note at rest 02 sats  97% on RA   General appearance:    chronically ill disheveled amb wm/ very congested sounding cough  HEENT : Oropharynx clear    NECK :  without  apparent JVD/ palpable Nodes/TM    LUNGS: no acc muscle use,  Mild barrel  contour chest wall with bilateral coarse  insp /exp rhonchi and  without cough on insp or exp maneuvers  and mild  Hyperresonant  to  percussion bilaterally     CV:  RRR  no s3 or murmur or increase in P2, and no edema   ABD:  soft and nontender with pos end  insp Hoover's  in the supine position.  No bruits or organomegaly appreciated   MS:  Nl gait/ ext warm without deformities Or obvious joint restrictions  calf tenderness, cyanosis or clubbing     SKIN: warm and dry without lesions    NEURO:  alert, approp, nl sensorium with  no motor or cerebellar deficits apparent.       Labs ordered/ reviewed:     Chemistry      Component Value Date/Time   NA 142 05/16/2023 1606   NA 142 02/26/2023 1027   K 3.5 05/16/2023 1606   CL 105 05/16/2023 1606   CO2 28 05/16/2023 1606   BUN 17 05/16/2023 1606   BUN 20 02/26/2023 1027   CREATININE 0.93 05/16/2023 1606   CREATININE 0.86 08/12/2013 1046      Component Value Date/Time   CALCIUM 8.8 05/16/2023 1606   ALKPHOS 75 01/17/2023 0931   AST 28 01/17/2023 0931   ALT 15 01/17/2023 0931   BILITOT 0.3 01/17/2023 0931        Lab Results  Component Value Date   WBC 6.0 05/16/2023   HGB 12.5 (L) 05/16/2023   HCT 39.2 05/16/2023   MCV 97.7 05/16/2023   PLT 236.0 05/16/2023     Lab Results  Component Value Date   DDIMER 0.49 02/26/2023      Lab Results  Component Value Date   TSH 0.697 02/26/2023          Lab Results  Component Value Date    ESRSEDRATE 16 05/16/2023   ESRSEDRATE 14 02/26/2023          I personally reviewed images and agrewith radiology impression as follows:   Chest CT  wo contrast 04/25/23 1. Persistent patchy airspace opacity in the right upper lobe posteriorly adjacent to the major fissure . 2. New right lower lobe airspace opacity with air bronchograms is likely an infiltrate. Similar findings in the left lower lobe anteriorly adjacent to the major fissure. 3. Multifocal bronchiectasis and patchy areas of tree-in-bud type nodularity. 4. Stable scattered borderline mediastinal hilar lymph nodes likely reactive given the underlying lung findings. Aortic Atherosclerosis (ICD10-I70.0) and Emphysema (ICD10-J43.9).      Assessment

## 2023-05-16 NOTE — Patient Instructions (Addendum)
Augmentin 875 mg take one pill twice daily  X 10 days - take at breakfast and supper with large glass of water.  It would help reduce the usual side effects (diarrhea and yeast infections) if you ate cultured yogurt at lunch.   Prednisone 10 mg take  4 each am x 2 days,   2 each am x 2 days,  1 each am x 2 days and stop   Change pantoprazole to where your take  40 mg Take 30-60 min before first meal of the day    Please remember to go to the lab and x-ray department  for your tests - we will call you with the results when they are available.       Please schedule a follow up office visit in 4 weeks, sooner if needed  with all medications /inhalers/ solutions in hand so we can verify exactly what you are taking. This includes all medications from all doctors and over the counters

## 2023-05-17 LAB — CBC WITH DIFFERENTIAL/PLATELET
Basophils Absolute: 0 10*3/uL (ref 0.0–0.1)
Basophils Relative: 0.5 % (ref 0.0–3.0)
Eosinophils Absolute: 0.1 10*3/uL (ref 0.0–0.7)
Eosinophils Relative: 2.1 % (ref 0.0–5.0)
HCT: 39.2 % (ref 39.0–52.0)
Hemoglobin: 12.5 g/dL — ABNORMAL LOW (ref 13.0–17.0)
Lymphocytes Relative: 20.9 % (ref 12.0–46.0)
Lymphs Abs: 1.3 10*3/uL (ref 0.7–4.0)
MCHC: 31.9 g/dL (ref 30.0–36.0)
MCV: 97.7 fl (ref 78.0–100.0)
Monocytes Absolute: 0.4 10*3/uL (ref 0.1–1.0)
Monocytes Relative: 7.3 % (ref 3.0–12.0)
Neutro Abs: 4.2 10*3/uL (ref 1.4–7.7)
Neutrophils Relative %: 69.2 % (ref 43.0–77.0)
Platelets: 236 10*3/uL (ref 150.0–400.0)
RBC: 4.01 Mil/uL — ABNORMAL LOW (ref 4.22–5.81)
RDW: 14.1 % (ref 11.5–15.5)
WBC: 6 10*3/uL (ref 4.0–10.5)

## 2023-05-17 LAB — BASIC METABOLIC PANEL
BUN: 17 mg/dL (ref 6–23)
CO2: 28 mEq/L (ref 19–32)
Calcium: 8.8 mg/dL (ref 8.4–10.5)
Chloride: 105 mEq/L (ref 96–112)
Creatinine, Ser: 0.93 mg/dL (ref 0.40–1.50)
GFR: 74.66 mL/min (ref >60.00)
Glucose, Bld: 160 mg/dL — ABNORMAL HIGH (ref 70–99)
Potassium: 3.5 mEq/L (ref 3.5–5.1)
Sodium: 142 mEq/L (ref 135–145)

## 2023-05-17 LAB — SEDIMENTATION RATE: Sed Rate: 16 mm/hr (ref 0–20)

## 2023-05-19 NOTE — Assessment & Plan Note (Signed)
02 2lpm and prn daytime as of initial pulmonary eval 02/26/23  -02/26/2023   Walked on RA  x  2  lap(s) =  approx 300  ft  @ slow/unsteady gait  stopped due to sob with lowest 02 sats 91%   Advised to continue 2lpm hs and daytime prn with goal keeping sats > 90% at all times

## 2023-05-19 NOTE — Assessment & Plan Note (Addendum)
S/p SBRT to [facility] completed 03/29/21 (had refused bx and was empirically radiated with curative intent)  - see CXR 02/26/2023 not seen on pa film from 05/02/22 - f/u CT 04/25/23 air bronchograms in [facility] and [facility] > rx augmentin  05/16/2023 >>>  Not a candidate for fob at present > need to see if we can clear up all th airway congestion first with abx/pred/saba and regroup in 4 weeks with all meds in hand using a trust but verify approach to confirm accurate Medication  Reconciliation The principal here is that until we are certain that the  patients are doing what we've asked, it makes no sense to ask them to do more.

## 2023-05-19 NOTE — Assessment & Plan Note (Signed)
Quit smoking 1999 /MM - Spirometry 01/13/18 @wt  198  FEV1 1.34 (47%)  Ratio 0.69 @ wt 198 with mildly concave F/V and no response to saba - 02/26/2023   Walked on RA  x  2  lap(s) =  approx 300  ft  @ slow/unsteady gait  stopped due to sob with lowest 02 sats 91%    - 02/26/23 alpha one phenotype  MM/ level 129   eos 0.1   Clearly worse now though not clear how much is copd > bilateral AS dz with Airbronchograms R> L is more c/w infection ? Aspiration mech ? > try augmentin x 10 days and 6 d prednisone for any inflammatory component then regroup with cxr in [place] > admit in meantime if deteriorates as outpt.

## 2023-05-19 NOTE — Assessment & Plan Note (Addendum)
Try off acei 04/01/2023 due to noct choking > resolved 05/16/2023   Although even in retrospect it may not be clear the ACEi contributed to the pt's symptoms,  Pt improved off them and adding them back at this point or in the future would risk confusion in interpretation of non-specific respiratory symptoms to which this patient is prone  ie  Better not to muddy the waters here.   >>> continue diovan 80 mg daily          Each maintenance medication was reviewed in detail including emphasizing most importantly the difference between maintenance and prns and under what circumstances the prns are to be triggered using an action plan format where appropriate.  Total time for H and P, chart review, counseling, reviewing hfa/neb/ 02  device(s) and generating customized AVS unique to this office visit / same day charting >40 min for multiple  refractory respiratory  symptoms/problems  of uncertain etiology

## 2023-05-20 ENCOUNTER — Encounter: Payer: No Typology Code available for payment source | Admitting: Internal Medicine

## 2023-05-20 NOTE — Progress Notes (Signed)
 This encounter was created in error - please disregard.

## 2023-05-21 ENCOUNTER — Encounter (HOSPITAL_COMMUNITY): Payer: Non-veteran care | Admitting: Physical Therapy

## 2023-05-22 ENCOUNTER — Other Ambulatory Visit: Payer: Self-pay

## 2023-05-22 MED ORDER — AMOXICILLIN-POT CLAVULANATE 875-125 MG PO TABS: 1.0000 | ORAL_TABLET | Freq: Two times a day (BID) | ORAL | 0 refills | Status: DC

## 2023-05-24 DIAGNOSIS — J449 Chronic obstructive pulmonary disease, unspecified: Secondary | ICD-10-CM | POA: Diagnosis not present

## 2023-05-24 DIAGNOSIS — I10 Essential (primary) hypertension: Secondary | ICD-10-CM | POA: Diagnosis not present

## 2023-05-28 ENCOUNTER — Encounter (HOSPITAL_COMMUNITY): Payer: Non-veteran care | Admitting: Physical Therapy

## 2023-06-05 ENCOUNTER — Encounter (HOSPITAL_COMMUNITY): Payer: Non-veteran care

## 2023-06-11 ENCOUNTER — Encounter (HOSPITAL_COMMUNITY): Payer: Non-veteran care | Admitting: Physical Therapy

## 2023-06-13 ENCOUNTER — Encounter: Payer: Self-pay | Admitting: Internal Medicine

## 2023-06-13 ENCOUNTER — Ambulatory Visit (INDEPENDENT_AMBULATORY_CARE_PROVIDER_SITE_OTHER): Payer: No Typology Code available for payment source | Admitting: Internal Medicine

## 2023-06-13 VITALS — BP 120/80 | HR 121 | Ht 68.0 in | Wt 187.4 lb

## 2023-06-13 DIAGNOSIS — J9611 Chronic respiratory failure with hypoxia: Secondary | ICD-10-CM

## 2023-06-13 DIAGNOSIS — J449 Chronic obstructive pulmonary disease, unspecified: Secondary | ICD-10-CM | POA: Diagnosis not present

## 2023-06-13 MED ORDER — PREDNISONE 10 MG PO TABS
ORAL_TABLET | ORAL | 0 refills | Status: AC
Start: 2023-06-13 — End: ?

## 2023-06-13 MED ORDER — DOXYCYCLINE HYCLATE 100 MG PO TABS: ORAL_TABLET | ORAL | 0 refills | Status: DC

## 2023-06-13 NOTE — Patient Instructions (Addendum)
Doxycycline 100 mg twice daily x 10 days  Prednisone 10 mg take  4 each am x 2 days,   2 each am x 2 days,  1 each am x 2 days and stop   Increase Nebulizer with albuterol 2.5mg  and budesonide 0.25  four times a day   Make sure you check your oxygen saturation  AT  your highest level of activity (not after you stop)   to be sure it stays over 90% and adjust  02 flow upward to maintain this level if needed but remember to turn it back to previous settings when you stop (to conserve your supply).   Please schedule a follow up office visit w/in 2 weeks, sooner if needed  with all medications /inhalers/ solutions in hand so we can verify exactly what you are taking. This includes all medications from all doctors and over the counters.

## 2023-06-13 NOTE — Progress Notes (Signed)
Caleb [name], male    DOB: 03/23/1935    MRN: [id number]  Brief patient profile:  64 yowm  MM/quit smoking 1999 with reported TB 1958  referred to pulmonary clinic in [place]  02/26/2023 by [facility] for ? Copd    S/p SBRT [place] completed 03/29/21 (had refused bx and was empirically radiated with curative intent    History of Present Illness  02/26/2023  Pulmonary/ 1st office eval/ Thijs Brunton / [place] [facility]  Chief Complaint  Patient presents with   Consult    [facility]   Dyspnea:  worse x one year/ rides scooter at grocery x 1 year  Cough: grinding upper airway/ worse at bedtime/min mucoid production   Sleep: electric bed 35 degrees x 1.5 y  SABA use: none  Rec Only use your albuterol as a rescue medicatio  Also  Ok to try albuterol x 2 puffs x 15 min before an activity (on alternating days)  that you know would usually make you short of breath Make sure you check your oxygen saturation  AT  your highest level of activity (not after you stop)   to be sure it stays over 90%   Cxr ? Pulm nodule > ct ordered    02/26/23 alpha one phenotype MM/ level 129 eos 0.1   Please schedule a follow up office visit in 6 weeks, call sooner if needed with all medications /inhalers/ solutions in hand   04/01/2023  f/u ov/[place] office/Galan Ghee re: doe/ lung nodule  maint on lisinopril   Chief Complaint  Patient presents with   Follow-up   Dyspnea:  more limited by legs than breathing  Cough: better but still choking sensation esp hs on acei  Sleeping: 35 degrees hob SABA use: rarely  02: 2lpm  Rec Stop lisinorpil and start diovan 80 mg (valsatan) one daily - call me if problems with it  Make sure you check your oxygen saturation  AT  your highest level of activity  Please schedule a follow up office visit in 6 weeks, call sooner if needed    05/16/2023  f/u ov/Ivory Maduro re: doe/cough/ abn ct    maint on saba prn  Chief Complaint  Patient presents with   Consult    CT chest 04/25/23/Consult [facility]  [facility] (Dr. Rolla Etienne)  Dyspnea:  still limited 50 ft at most, can't do mailbox  Cough: minimal grey s blood/ no pain with coughing / no more choking spells off acei  Sleeping: 35 degrees x  2-3 years can't breath flat  SABA use: hfa up to every 4 hours but only using twice daily  02: 2lpm hs  Rec Augmentin 875 mg take one pill twice daily  X 10 days   Prednisone 10 mg take  4 each am x 2 days,   2 each am x 2 days,  1 each am x 2 days and stop  Change pantoprazole to where your take  40 mg Take 30-60 min before first meal of the day  Please remember to go to the lab and x-ray department  for your tests - we will call you with the results when they are available.      Please schedule a follow up office visit in 4 weeks, sooner if needed  with all medications /inhalers/ solutions in hand      /06/13/2023  f/u ov/[place] office/Declyn Offield re: GOLD 3/02 dep maint on not sure/ did not bring meds   Chief Complaint  Patient presents with   Follow-up  Dyspnea:  50 ft - turns out can't do MB for over a year, legs ache too much  Cough: white Sleeping: electric bed x 30 degrees does fine  SABA use: not sure  02: 2lpm hs prn daytime / no better when on 02      No obvious day to day or daytime variability or assoc   purulent sputum or mucus plugs or hemoptysis or cp or chest tightness, subjective wheeze or overt sinus or hb symptoms.    Also denies any obvious fluctuation of symptoms with weather or environmental changes or other aggravating or alleviating factors except as outlined above   No unusual exposure hx or h/o childhood pna/ asthma or knowledge of premature birth.  Current Allergies, Complete Past Medical History, Past Surgical History, Family History, and Social History were reviewed in [employer] record.  ROS  The following are not active complaints unless bolded Hoarseness, sore throat, dysphagia, dental problems, itching,  sneezing,  nasal congestion or discharge of excess mucus or purulent secretions, ear ache,   fever, chills, sweats, unintended wt loss or wt gain, classically pleuritic or exertional cp,  orthopnea pnd or arm/hand swelling  or leg swelling, presyncope, palpitations, abdominal pain, anorexia, nausea, vomiting, diarrhea  or change in bowel habits or change in bladder habits, change in stools or change in urine, dysuria, hematuria,  rash, arthralgias, visual complaints, headache, numbness, weakness or ataxia or problems with walking or coordination,  change in mood or  memory.        Current Meds  Medication Sig   albuterol (PROVENTIL) (2.5 MG/3ML) 0.083% nebulizer solution Take 3 mLs (2.5 mg total) by nebulization 4 (four) times daily.   albuterol (VENTOLIN HFA) 108 (90 Base) MCG/ACT inhaler Inhale 2 puffs into the lungs every 6 (six) hours as needed for wheezing or shortness of breath.   allopurinol (ZYLOPRIM) 300 MG tablet Take 300 mg by mouth daily as needed (for gout).   Ascorbic Acid 500 MG CAPS Take 1 tablet by mouth daily.   aspirin 81 MG chewable tablet Chew 81 mg by mouth daily.   budesonide (PULMICORT) 0.25 MG/2ML nebulizer solution One twice daily with albuterol nebulizer   carboxymethylcellulose 1 % ophthalmic solution INSTILL 1 DROP IN BOTH EYES FOUR TIMES A DAY   Cholecalciferol 250 MCG (10000 UT) CAPS TAKE ONE TABLET BY MOUTH DAILY FOR VITAMIN D SUPPLEMENT   ferrous sulfate 325 (65 FE) MG tablet Take 1 tablet by mouth daily.   finasteride (PROSCAR) 5 MG tablet Take 5 mg by mouth daily.     folic acid (FOLVITE) 1 MG tablet Take 1 mg by mouth daily.   gabapentin (NEURONTIN) 100 MG capsule Take 200 mg by mouth at bedtime.   galantamine (RAZADYNE) 8 MG tablet Take 8 mg by mouth 2 (two) times daily.   insulin glargine (LANTUS SOLOSTAR) 100 UNIT/ML Solostar Pen Take 30units SQ nightly   levothyroxine (SYNTHROID, LEVOTHROID) 150 MCG tablet Take 150 mcg by mouth daily before breakfast.    loratadine (CLARITIN) 10 MG tablet Take 10 mg by mouth daily.   metFORMIN (GLUCOPHAGE) 1000 MG tablet Take 1 tablet (1,000 mg total) by mouth 2 (two) times daily with a meal.   oxybutynin (DITROPAN) 5 MG tablet Take 5 mg by mouth 2 (two) times daily.   pantoprazole (PROTONIX) 40 MG tablet Take 40 mg by mouth 2 (two) times daily.   pravastatin (PRAVACHOL) 10 MG tablet Take 10 mg by  mouth daily.   tamsulosin (FLOMAX) 0.4 MG CAPS capsule Take 0.4 mg by mouth daily.   valsartan (DIOVAN) 80 MG tablet Take 1 tablet (80 mg total) by mouth daily.                 Past Medical History:  Diagnosis Date   Acute exacerbation of chronic bronchitis (HCC)    Anemia    BPH (benign prostatic hypertrophy)    CAD (coronary artery disease)    Diabetes mellitus without complication (HCC)    Diverticulitis    Diverticulosis    GERD (gastroesophageal reflux disease)    GI bleed    History of tuberculosis    Hyperlipidemia    Hyperthyroidism        Objective:    Wts  06/13/2023        187 05/16/2023       182  04/01/23 180 lb 6.4 oz (81.8 kg)  02/26/23 180 lb 12.8 oz (82 kg)  02/22/21 178 lb (80.7 kg)    Vital signs reviewed  06/13/2023  - Note at rest 02 sats  94% on RA   General appearance:    disheveled somewhat frail  amb wm using rollator  HEENT :  Oropharynx  clear     NECK :  without JVD/Nodes/TM/ nl carotid upstrokes bilaterally   LUNGS: no acc muscle use,  Mod barrel  contour chest wall with bilateral  insp/exp rhonchi  and  without cough on insp or exp maneuvers and mod  Hyperresonant  to  percussion bilaterally     CV:  RRR  no s3 or murmur or increase in P2, and no edema   ABD: pot belly but soft and nontender    MS:   Ext warm without deformities or   obvious joint restrictions , calf tenderness, cyanosis or clubbing  SKIN: warm and dry without lesions    NEURO:  alert, approp, nl sensorium with  no motor or cerebellar deficits apparent.                     I  personally reviewed images and agree with radiology impression as follows:  CXR:   05/16/23 Increased reticulonodular opacities in the lower lungs suggesting multifocal infection.        Assessment

## 2023-06-14 NOTE — Assessment & Plan Note (Signed)
02 2lpm and prn daytime as of initial pulmonary eval 02/26/23  -02/26/2023   Walked on RA  x  2  lap(s) =  approx 300  ft  @ slow/unsteady gait  stopped due to sob with lowest 02 sats 91%   Again advised: Make sure you check your oxygen saturation  AT  your highest level of activity (not after you stop)   to be sure it stays over 90% and adjust  02 flow upward to maintain this level if needed but remember to turn it back to previous settings when you stop (to conserve your supply).   Each maintenance medication was reviewed in detail including emphasizing most importantly the difference between maintenance and prns and under what circumstances the prns are to be triggered using an action plan format where appropriate.  Total time for H and P, chart review, counseling, reviewing neb/02 device(s) and generating customized AVS unique to this office visit / same day charting = 30 min

## 2023-06-14 NOTE — Assessment & Plan Note (Addendum)
Quit smoking 1999 /MM - Spirometry 01/13/18 @wt  198  FEV1 1.34 (47%)  Ratio 0.69 @ wt 198 with mildly concave F/V and no response to saba - 02/26/2023   Walked on RA  x  2  lap(s) =  approx 300  ft  @ slow/unsteady gait  stopped due to sob with lowest 02 sats 91%    - 02/26/23 alpha one phenotype  MM/ level 129   eos 0.1  - 06/13/2023 maint rx - alb/bud 0.25 mg qid   Having mild flare ? Weather related (I suspect the weather = whether he's actually taking meds correctly) so rec doxy/ pred short course only for now  Major concerns remain  with med reconciliation so will keep it simple for now with just neb qid as above and have him return  in 2 weeks with all meds in hand using a trust but verify approach to confirm accurate Medication  Reconciliation The principal here is that until we are certain that the  patients are doing what we've asked, it makes no sense to ask them to do more.

## 2023-06-19 ENCOUNTER — Ambulatory Visit: Payer: Non-veteran care | Admitting: Adult Health

## 2023-06-30 NOTE — Progress Notes (Unsigned)
Caleb [name], male    DOB: Feb 05, 1935    MRN: [id number]  Brief patient profile:  71 yowm  MM/quit smoking 1999 with reported TB 1958  referred to pulmonary clinic in [place]  02/26/2023 by [facility] for ? Copd    S/p SBRT [place] completed 03/29/21 (had refused bx and was empirically radiated with curative intent    History of Present Illness  02/26/2023  Pulmonary/ 1st office eval/ Caleb [name] / [place] [facility]  Chief Complaint  Patient presents with   Consult    [facility]   Dyspnea:  worse x one year/ rides scooter at grocery x 1 year  Cough: grinding upper airway/ worse at bedtime/min mucoid production   Sleep: electric bed 35 degrees x 1.5 y  SABA use: none  Rec Only use your albuterol as a rescue medicatio  Also  Ok to try albuterol x 2 puffs x 15 min before an activity (on alternating days)  that you know would usually make you short of breath Make sure you check your oxygen saturation  AT  your highest level of activity (not after you stop)   to be sure it stays over 90%   Cxr ? Pulm nodule > ct ordered    02/26/23 alpha one phenotype MM/ level 129 eos 0.1   Please schedule a follow up office visit in 6 weeks, call sooner if needed with all medications /inhalers/ solutions in hand   04/01/2023  f/u ov/[place] office/Caleb [name] re: doe/ lung nodule  maint on lisinopril   Chief Complaint  Patient presents with   Follow-up   Dyspnea:  more limited by legs than breathing  Cough: better but still choking sensation esp hs on acei  Sleeping: 35 degrees hob SABA use: rarely  02: 2lpm  Rec Stop lisinorpil and start diovan 80 mg (valsatan) one daily - call me if problems with it  Make sure you check your oxygen saturation  AT  your highest level of activity  Please schedule a follow up office visit in 6 weeks, call sooner if needed    05/16/2023  f/u ov/Caleb [name] re: doe/cough/ abn ct    maint on saba prn  Chief Complaint  Patient presents with   Consult    CT chest 04/25/23/Consult [facility]  [facility] (Dr. Rolla Etienne)  Dyspnea:  still limited 50 ft at most, can't do mailbox  Cough: minimal grey s blood/ no pain with coughing / no more choking spells off acei  Sleeping: 35 degrees x  2-3 years can't breath flat  SABA use: hfa up to every 4 hours but only using twice daily  02: 2lpm hs  Rec Augmentin 875 mg take one pill twice daily  X 10 days   Prednisone 10 mg take  4 each am x 2 days,   2 each am x 2 days,  1 each am x 2 days and stop  Change pantoprazole to where your take  40 mg Take 30-60 min before first meal of the day  Please remember to go to the lab and x-ray department  for your tests - we will call you with the results when they are available.      Please schedule a follow up office visit in 4 weeks, sooner if needed  with all medications /inhalers/ solutions in hand      06/13/2023  f/u ov/[name]/Caleb [name] re: GOLD 3/02 dep maint on not sure/ did not bring meds   Chief Complaint  Patient presents with   Follow-up  Dyspnea:  50 ft - turns out can't do MB for over a year, legs ache too much  Cough: white Sleeping: electric bed x 30 degrees does fine  SABA use: not sure  02: 2lpm hs prn daytime / no better when on 02 Rec Doxycycline 100 mg twice daily x 10 days Prednisone 10 mg take  4 each am x 2 days,   2 each am x 2 days,  1 each am x 2 days and stop  Increase Nebulizer with albuterol 2.5mg  and budesonide 0.25  four times a day  Make sure you check your oxygen saturation  AT  your highest level of activity (not after you stop)   to be sure it stays over 90%  Please schedule a follow up office visit w/in 2 weeks, sooner if needed  with all medications /inhalers/ solutions in hand   07/01/2023  f/u ov/[name]/Caleb [name] re: GOLD 3 copd/  maint on alb/bud  did not   bring meds / did not bring 02 and does not know name of 02 company  Chief Complaint  Patient presents with   COPD    Mixed    Dyspnea:  walking room to room  with 2lpm  Cough: denies change in baseline or purulent sputum but has a very congested sounding cough during interview  Sleeping: 2lpm electric bed  SABA use: neb qid  02: 2lpm      No obvious day to day or daytime variability or assoc excess/ purulent sputum or mucus plugs or hemoptysis or cp or chest tightness, subjective wheeze or overt sinus or hb symptoms.   Sleeping  without nocturnal  or early am exacerbation  of respiratory  c/o's or need for noct saba. Also denies any obvious fluctuation of symptoms with weather or environmental changes or other aggravating or alleviating factors except as outlined above   No unusual exposure hx or h/o childhood pna/ asthma or knowledge of premature birth.  Current Allergies, Complete Past Medical History, Past Surgical History, Family History, and Social History were reviewed in [employer] record.  ROS  The following are not active complaints unless bolded Hoarseness, sore throat, dysphagia, dental problems, itching, sneezing,  nasal congestion or discharge of excess mucus or purulent secretions, ear ache,   fever, chills, sweats, unintended wt loss or wt gain, classically pleuritic or exertional cp,  orthopnea pnd or arm/hand swelling  or leg swelling, presyncope, palpitations, abdominal pain, anorexia, nausea, vomiting, diarrhea  or change in bowel habits or change in bladder habits, change in stools or change in urine, dysuria, hematuria,  rash, arthralgias, visual complaints, headache, numbness, weakness or ataxia or problems with walking or coordination,  change in mood or  memory.        Current Meds  Medication Sig   albuterol (PROVENTIL) (2.5 MG/3ML) 0.083% nebulizer solution Take 3 mLs (2.5 mg total) by nebulization 4 (four) times daily.   albuterol (VENTOLIN HFA) 108 (90 Base) MCG/ACT inhaler Inhale 2 puffs into the lungs every 6 (six) hours as needed for wheezing or shortness of breath.   allopurinol (ZYLOPRIM) 300  MG tablet Take 300 mg by mouth daily as needed (for gout).   Ascorbic Acid 500 MG CAPS Take 1 tablet by mouth daily.   aspirin 81 MG chewable tablet Chew 81 mg by mouth daily.   budesonide (PULMICORT) 0.25 MG/2ML nebulizer solution One twice daily with albuterol nebulizer   carboxymethylcellulose 1 % ophthalmic solution  INSTILL 1 DROP IN BOTH EYES FOUR TIMES A DAY   Cholecalciferol 250 MCG (10000 UT) CAPS TAKE ONE TABLET BY MOUTH DAILY FOR VITAMIN D SUPPLEMENT   ferrous sulfate 325 (65 FE) MG tablet Take 1 tablet by mouth daily.   finasteride (PROSCAR) 5 MG tablet Take 5 mg by mouth daily.     folic acid (FOLVITE) 1 MG tablet Take 1 mg by mouth daily.   gabapentin (NEURONTIN) 100 MG capsule Take 200 mg by mouth at bedtime.   galantamine (RAZADYNE) 8 MG tablet Take 8 mg by mouth 2 (two) times daily.   insulin glargine (LANTUS SOLOSTAR) 100 UNIT/ML Solostar Pen Take 30units SQ nightly   levothyroxine (SYNTHROID, LEVOTHROID) 150 MCG tablet Take 150 mcg by mouth daily before breakfast.   loratadine (CLARITIN) 10 MG tablet Take 10 mg by mouth daily.   metFORMIN (GLUCOPHAGE) 1000 MG tablet Take 1 tablet (1,000 mg total) by mouth 2 (two) times daily with a meal.   oxybutynin (DITROPAN) 5 MG tablet Take 5 mg by mouth 2 (two) times daily.   pantoprazole (PROTONIX) 40 MG tablet Take 40 mg by mouth 2 (two) times daily.   pravastatin (PRAVACHOL) 10 MG tablet Take 10 mg by mouth daily.   predniSONE (DELTASONE) 10 MG tablet Take  4 each am x 2 days,   2 each am x 2 days,  1 each am x 2 days and stop   tamsulosin (FLOMAX) 0.4 MG CAPS capsule Take 0.4 mg by mouth daily.   valsartan (DIOVAN) 80 MG tablet Take 1 tablet (80 mg total) by mouth daily.                Past Medical History:  Diagnosis Date   Acute exacerbation of chronic bronchitis (HCC)    Anemia    BPH (benign prostatic hypertrophy)    CAD (coronary artery disease)    Diabetes mellitus without complication (HCC)    Diverticulitis     Diverticulosis    GERD (gastroesophageal reflux disease)    GI bleed    History of tuberculosis    Hyperlipidemia    Hyperthyroidism        Objective:    Wts  07/01/2023       194  06/13/2023       187 05/16/2023       182  04/01/23 180 lb 6.4 oz (81.8 kg)  02/26/23 180 lb 12.8 oz (82 kg)  02/22/21 178 lb (80.7 kg)      Vital signs reviewed  07/01/2023  - Note at rest 02 sats  91% on RA   General appearance:    w/c bound elderly disheveled wm nad / congested rattling cough spont    HEENT :  Oropharynx  clear    NECK :  without JVD/Nodes/TM/ nl carotid upstrokes bilaterally   LUNGS: no acc muscle use,  Mod barrel  contour chest wall with bilateral  Distant bs with faint late exp wheeze and  without cough on insp or exp maneuvers and mod  Hyperresonant  to  percussion bilaterally     CV:  RRR  no s3 or murmur or increase in P2, and no edema   ABD:  soft and nontender    MS:   Ext warm without deformities or   obvious joint restrictions , calf tenderness, cyanosis or clubbing  SKIN: warm and dry without lesions    NEURO:  alert, approp, nl sensorium with  no motor or cerebellar deficits apparent.  Assessment

## 2023-07-01 ENCOUNTER — Encounter: Payer: Self-pay | Admitting: Internal Medicine

## 2023-07-01 ENCOUNTER — Ambulatory Visit (INDEPENDENT_AMBULATORY_CARE_PROVIDER_SITE_OTHER): Payer: No Typology Code available for payment source | Admitting: Internal Medicine

## 2023-07-01 VITALS — BP 122/74 | HR 91 | Ht 68.0 in | Wt 194.0 lb

## 2023-07-01 DIAGNOSIS — J449 Chronic obstructive pulmonary disease, unspecified: Secondary | ICD-10-CM | POA: Diagnosis not present

## 2023-07-01 DIAGNOSIS — J9611 Chronic respiratory failure with hypoxia: Secondary | ICD-10-CM

## 2023-07-01 NOTE — Patient Instructions (Addendum)
Give [place] a call back with the name of your 0xygen company and your contact person and their name.  For cough/ congestion > mucinex or mucinex dm  up to maximum of  1200 mg every 12 hours and use the flutter valve as much as you can    Please schedule a follow up visit in 3 months but call sooner if needed with all meds/ flutter valve and your portable 02 system  Add f/u cxr next ov

## 2023-07-02 NOTE — Assessment & Plan Note (Addendum)
Quit smoking 1999 /MM - Spirometry 01/13/18 @wt  198  FEV1 1.34 (47%)  Ratio 0.69 @ wt 198 with mildly concave F/V and no response to saba - 02/26/2023   Walked on RA  x  2  lap(s) =  approx 300  ft  @ slow/unsteady gait  stopped due to sob with lowest 02 sats 91%    - 02/26/23 alpha one phenotype  MM/ level 129   eos 0.1  - 06/13/2023 maint rx - alb/bud 0.25 mg qid   Rec add back mucinex/ flutter valve   Ideally needs lama/laba nebs but [facility]'t access thru [facility] or [facility] so will continue alb/bud but ask him again to return with all meds in hand using a trust but verify approach to confirm accurate Medication  Reconciliation The principal here is that until we are certain that the  patients are doing what we've asked, it makes no sense to ask them to do more.

## 2023-07-02 NOTE — Assessment & Plan Note (Signed)
02 2lpm and prn daytime as of initial pulmonary eval 02/26/23  -02/26/2023   Walked on RA  x  2  lap(s) =  approx 300  ft  @ slow/unsteady gait  stopped due to sob with lowest 02 sats 91%   Advised: Make sure you check your oxygen saturation  AT  your highest level of activity (not after you stop)   to be sure it stays over 90% and adjust  02 flow upward to maintain this level if needed but remember to turn it back to previous settings when you stop (to conserve your supply).   Also requested he call us with 02 info see avs for instructions unique to this ov          Each maintenance medication was reviewed in detail including emphasizing most importantly the difference between maintenance and prns and under what circumstances the prns are to be triggered using an action plan format where appropriate.  Total time for H and P, chart review, counseling, reviewing neb/02 device(s) and generating customized AVS unique to this office visit / same day charting = 20 min

## 2023-07-04 ENCOUNTER — Telehealth: Payer: Self-pay | Admitting: Internal Medicine

## 2023-07-04 NOTE — Telephone Encounter (Signed)
PA [place] calling from [place] needing to speak to DR. Wert about get a  bronch. For this pt. Dobe but nothing was said on last ov notes please advise

## 2023-07-05 NOTE — Telephone Encounter (Signed)
Left message on her phone to call me on my cell but no  need for bronchoscopy for now

## 2023-07-08 ENCOUNTER — Encounter (HOSPITAL_COMMUNITY): Payer: Self-pay

## 2023-07-08 ENCOUNTER — Ambulatory Visit (HOSPITAL_COMMUNITY)
Admission: RE | Admit: 2023-07-08 | Discharge: 2023-07-08 | Disposition: A | Discharge status: Home / Self Care | Payer: No Typology Code available for payment source | Source: Ambulatory Visit | Attending: Gerontology | Admitting: Gerontology

## 2023-07-08 ENCOUNTER — Other Ambulatory Visit (HOSPITAL_COMMUNITY): Payer: Self-pay | Admitting: Gerontology

## 2023-07-08 DIAGNOSIS — J9621 Acute and chronic respiratory failure with hypoxia: Secondary | ICD-10-CM | POA: Diagnosis not present

## 2023-07-08 DIAGNOSIS — R059 Cough, unspecified: Secondary | ICD-10-CM | POA: Diagnosis not present

## 2023-07-08 DIAGNOSIS — J441 Chronic obstructive pulmonary disease with (acute) exacerbation: Secondary | ICD-10-CM | POA: Diagnosis not present

## 2023-07-08 DIAGNOSIS — I10 Essential (primary) hypertension: Secondary | ICD-10-CM | POA: Diagnosis not present

## 2023-07-08 DIAGNOSIS — R918 Other nonspecific abnormal finding of lung field: Secondary | ICD-10-CM | POA: Diagnosis not present

## 2023-07-08 NOTE — Therapy (Signed)
[facility] [address] [place], [place], [address] Phone: [phone]   Fax:  [phone]  Patient Details  Name: Caleb [name] MRN: [id number] Date of Birth: [birth year] Referring Provider:  No ref. provider found  Encounter Date: 07/08/2023   Nelida Meuse, PT 07/08/2023, 11:03 AM  PHYSICAL THERAPY DISCHARGE SUMMARY  Visits from Start of Care: 2  Current functional level related to goals / functional outcomes: Met all short term goals.    Remaining deficits: ROM Limitations Muscle weakness   Education / Equipment: HPE   Patient agrees to discharge. Patient goals were partially met. Patient is being discharged due to not returning since the last visit.   [facility] at [facility] [address] [place], [place], [address] Phone: [phone]   Fax:  [phone]

## 2023-08-07 ENCOUNTER — Ambulatory Visit (INDEPENDENT_AMBULATORY_CARE_PROVIDER_SITE_OTHER): Payer: Medicare HMO

## 2023-08-07 ENCOUNTER — Ambulatory Visit
Admission: EM | Admit: 2023-08-07 | Discharge: 2023-08-07 | Disposition: A | Discharge status: Home / Self Care | Payer: Medicare HMO | Attending: Family Medicine | Admitting: Family Medicine

## 2023-08-07 ENCOUNTER — Other Ambulatory Visit: Payer: Self-pay

## 2023-08-07 ENCOUNTER — Encounter: Payer: Self-pay | Admitting: Emergency Medicine

## 2023-08-07 DIAGNOSIS — J9811 Atelectasis: Secondary | ICD-10-CM | POA: Diagnosis not present

## 2023-08-07 DIAGNOSIS — J90 Pleural effusion, not elsewhere classified: Secondary | ICD-10-CM | POA: Diagnosis not present

## 2023-08-07 DIAGNOSIS — J441 Chronic obstructive pulmonary disease with (acute) exacerbation: Secondary | ICD-10-CM | POA: Diagnosis not present

## 2023-08-07 DIAGNOSIS — R059 Cough, unspecified: Secondary | ICD-10-CM | POA: Diagnosis not present

## 2023-08-07 DIAGNOSIS — R0602 Shortness of breath: Secondary | ICD-10-CM | POA: Diagnosis not present

## 2023-08-07 MED ORDER — AZITHROMYCIN 250 MG PO TABS: 250.0000 mg | ORAL_TABLET | Freq: Every day | ORAL | 0 refills | Status: AC

## 2023-08-07 MED ORDER — PREDNISONE 10 MG (21) PO TBPK: ORAL_TABLET | Freq: Every day | ORAL | 0 refills | Status: AC

## 2023-08-07 NOTE — ED Triage Notes (Addendum)
Pt reports "cough" for awhile. Pt reports nonproductive at baseline but family reports"its gotten worse recently." Pt reports pain with movement and deep breath.  Pt room air 83%. Pt reports is on 2 liters normally but states "I forgot my oxygen at home." Prentiss applied in triage. MD aware.

## 2023-08-08 DIAGNOSIS — J449 Chronic obstructive pulmonary disease, unspecified: Secondary | ICD-10-CM | POA: Diagnosis not present

## 2023-08-08 DIAGNOSIS — I10 Essential (primary) hypertension: Secondary | ICD-10-CM | POA: Diagnosis not present

## 2023-08-10 NOTE — ED Provider Notes (Signed)
[facility]   [id number] 08/07/23 Arrival Time: 1356  ASSESSMENT & PLAN:  1. COPD exacerbation (HCC)    I have personally viewed and independently interpreted the imaging studies ordered this visit. Bibasilar atelectasis; no signs of PNA. Not significantly changes from prev chest film in his chart. Agree with radiology report.  No acute resp distress while here but with a bad cough. Begin: Meds ordered this encounter  Medications   azithromycin (ZITHROMAX) 250 MG tablet    Sig: Take 1 tablet (250 mg total) by mouth daily. Take first 2 tablets together, then 1 every day until finished.    Dispense:  6 tablet    Refill:  0   predniSONE (STERAPRED UNI-PAK 21 TAB) 10 MG (21) TBPK tablet    Sig: Take by mouth daily. Take as directed.    Dispense:  21 tablet    Refill:  0   OTC symptom care as needed.  Recommend:  Follow-up Information     [facility] [facility]   Specialty: Emergency Medicine Why: If symptoms worsen in any way. Contact information: [address] [place] [address] [phone]                Reviewed expectations re: course of current medical issues. Questions answered. Outlined signs and symptoms indicating need for more acute intervention. Patient verbalized understanding. After Visit Summary given.  SUBJECTIVE: History from: patient.  Caleb [name] is a 88 y.o. male who presents with complaint of  "cough for awhile". Pt reports nonproductive at baseline but family reports"its gotten worse recently." Pt reports pain with movement and deep breath.  Pt room air 83%. Pt reports is on 2 liters normally but states "I forgot my oxygen at home." [place] applied in triage. MD aware. Sats approx 83% while on 2L [place] here. Reports this is his baseline. Denies fever. Denies CP. Is ambulatory. No tx PTA.  Social History   Tobacco Use  Smoking Status Former   Current packs/day: 0.00   Average  packs/day: 0.8 packs/day for 54.1 years (40.6 ttl pk-yrs)   Types: Cigarettes   Start date: 09/16/1944   Quit date: 10/17/1998   Years since quitting: 24.8  Smokeless Tobacco Never   OBJECTIVE:  Vitals:   08/07/23 1429 08/07/23 1432  BP: (!) 171/79   Pulse: 84   Resp: (!) 22   Temp: (!) 97.5 F (36.4 C)   TempSrc: Oral   SpO2: (!) 85% 93%    Abnormal VS noted. Recheck RR at rest 18.  General appearance: alert; NAD HEENT: [place]; AT; with nasal congestion Neck: supple without LAD Cv: RRR without murmer Lungs: deep and wet cough, moderate bilateral wheezing Psychological: alert and cooperative; normal mood and affect  Imaging: DG Chest 2 View  Result Date: 08/07/2023 CLINICAL DATA:  Cough, shortness of breath. EXAM: CHEST - 2 VIEW COMPARISON:  July 08, 2023. FINDINGS: Stable cardiomediastinal silhouette. Minimal bibasilar subsegmental atelectasis is noted with small pleural effusions. Bony thorax is unremarkable. IMPRESSION: Minimal bibasilar subsegmental atelectasis with small pleural effusions. Electronically Signed   By: Lupita Raider M.D.   On: 08/07/2023 15:53    Allergies  Allergen Reactions   Levaquin [Levofloxacin In D5w]     SWELLING   Atorvastatin Other (See Comments)    Burning sensation   Breztri Aerosphere [Budeson-Glycopyrrol-Formoterol] Other (See Comments)    Ulcer in mouth   Lisinopril Cough   Pravastatin Other (See Comments)   Rosuvastatin Other (See  Comments)    Past Medical History:  Diagnosis Date   Acute exacerbation of chronic bronchitis (HCC)    Anemia    BPH (benign prostatic hypertrophy)    CAD (coronary artery disease)    Diabetes mellitus without complication (HCC)    Diverticulitis    Diverticulosis    GERD (gastroesophageal reflux disease)    GI bleed    History of tuberculosis    Hyperlipidemia    Hyperthyroidism    Family History  Problem Relation Age of Onset   Heart failure Mother    Heart attack Father    Stomach cancer  Sister        deceased in her 46s from stomach cancer   Colon cancer Neg Hx    Social History   Socioeconomic History   Marital status: Married    Spouse name: Not on file   Number of children: Not on file   Years of education: Not on file   Highest education level: Not on file  Occupational History   Occupation: Retired    Associate Professor: RETIRED  Tobacco Use   Smoking status: Former    Current packs/day: 0.00    Average packs/day: 0.8 packs/day for 54.1 years (40.6 ttl pk-yrs)    Types: Cigarettes    Start date: 09/16/1944    Quit date: 10/17/1998    Years since quitting: 24.8   Smokeless tobacco: Never  Substance and Sexual Activity   Alcohol use: No   Drug use: No   Sexual activity: Not on file  Other Topics Concern   Not on file  Social History Narrative   Married   No regular exercise   Was in [place] some years   Used to work in Designer, fashion/clothing and subsequently   Has [facility] [place] Dr. Malen Gauze   Social Determinants of Health   Financial Resource Strain: Not on file  Food Insecurity: Not on file  Transportation Needs: Not on file  Physical Activity: Not on file  Stress: Not on file  Social Connections: Not on file  Intimate Partner Violence: Not on file             [place], MD 08/10/23 1105

## 2023-09-08 DIAGNOSIS — I10 Essential (primary) hypertension: Secondary | ICD-10-CM | POA: Diagnosis not present

## 2023-09-08 DIAGNOSIS — J449 Chronic obstructive pulmonary disease, unspecified: Secondary | ICD-10-CM | POA: Diagnosis not present

## 2023-09-29 NOTE — Progress Notes (Deleted)
Caleb [name], male    DOB: Apr 19, 1935    MRN: [id number]  Brief patient profile:  29 yowm  MM/quit smoking 1999 with reported TB 1958  referred to pulmonary clinic in [place]  02/26/2023 by [facility] for ? Copd    S/p SBRT [place] completed 03/29/21 (had refused bx and was empirically radiated with curative intent    History of Present Illness  02/26/2023  Pulmonary/ 1st [name] eval/ Caleb [name] / [place] [name]  Chief Complaint  Patient presents with   Consult    [facility]   Dyspnea:  worse x one year/ rides scooter at grocery x 1 year  Cough: grinding upper airway/ worse at bedtime/min mucoid production   Sleep: electric bed 35 degrees x 1.5 y  SABA use: none  Rec Only use your albuterol as a rescue medicatio  Also  Ok to try albuterol x 2 puffs x 15 min before an activity (on alternating days)  that you know would usually make you short of breath Make sure you check your oxygen saturation  AT  your highest level of activity (not after you stop)   to be sure it stays over 90%   Cxr ? Pulm nodule > ct ordered    02/26/23 alpha one phenotype MM/ level 129 eos 0.1   Please schedule a follow up [name] visit in 6 weeks, call sooner if needed with all medications /inhalers/ solutions in hand   04/01/2023  f/u ov/Caleb [name]/Caleb [name] re: doe/ lung nodule  maint on lisinopril   Chief Complaint  Patient presents with   Follow-up   Dyspnea:  more limited by legs than breathing  Cough: better but still choking sensation esp hs on acei  Sleeping: 35 degrees hob SABA use: rarely  02: 2lpm  Rec Stop lisinorpil and start diovan 80 mg (valsatan) one daily - call me if problems with it  Make sure you check your oxygen saturation  AT  your highest level of activity  Please schedule a follow up [name] visit in 6 weeks, call sooner if needed    05/16/2023  f/u ov/Caleb [name] re: doe/cough/ abn ct    maint on saba prn  Chief Complaint  Patient presents with   Consult    CT chest 04/25/23/Consult for  Broncoscopy [facility] Ref[facility] (Dr. Rolla [name])  Dyspnea:  still limited 50 ft at most, can't do mailbox  Cough: minimal [place] blood/ no pain with coughing / no more choking spells off acei  Sleeping: 35 degrees x  2-3 years can't breath flat  SABA use: hfa up to every 4 hours but only using twice daily  02: 2lpm hs  Rec Augmentin 875 mg take one pill twice daily  X 10 days   Prednisone 10 mg take  4 each am x 2 days,   2 each am x 2 days,  1 each am x 2 days and stop  Change pantoprazole to where your take  40 mg Take 30-60 min before first meal of the day  Please remember to go to the lab and x-ray department  for your tests - we will call you with the results when they are available.      Please schedule a follow up [name] visit in 4 weeks, sooner if needed  with all medications /inhalers/ solutions in hand    07/01/2023  f/u ov/[place] [name]/Caleb [name] re: GOLD 3 copd/  maint on alb/bud  did not   bring meds / did not bring  02 and does not know name of 02 company  Chief Complaint  Patient presents with   COPD    Mixed    Dyspnea:  walking room to room with 2lpm  Cough: denies change in baseline or purulent sputum but has a very congested sounding cough during interview  Sleeping: 2lpm electric bed  SABA use: neb qid  02: 2lpm  Rec Give [place] a call back with the name of your 0xygen company and your contact person and their name. For cough/ congestion > mucinex or mucinex dm  up to maximum of  1200 mg every 12 hours and use the flutter valve as much as you can     follow up visit in 3 months but call sooner if needed with all meds/ flutter valve and your portable 02 system  Add f/u cxr next ov    09/30/2023  f/u ov/Caleb [name]/Caleb [name] re: GOLD 3/02 dep maint on ***  did bring meds/02 No chief complaint on file.   Dyspnea:  *** Cough: *** Sleeping: ***   resp cc  SABA use: *** 02: ***  Lung cancer screening: ***   No obvious day to day or daytime variability or assoc  excess/ purulent sputum or mucus plugs or hemoptysis or cp or chest tightness, subjective wheeze or overt sinus or hb symptoms.    Also denies any obvious fluctuation of symptoms with weather or environmental changes or other aggravating or alleviating factors except as outlined above   No unusual exposure hx or h/o childhood pna/ asthma or knowledge of premature birth.  Current Allergies, Complete Past Medical History, Past Surgical History, Family History, and Social History were reviewed in Caleb [name].  ROS  The following are not active complaints unless bolded Hoarseness, sore throat, dysphagia, dental problems, itching, sneezing,  nasal congestion or discharge of excess mucus or purulent secretions, ear ache,   fever, chills, sweats, unintended wt loss or wt gain, classically pleuritic or exertional cp,  orthopnea pnd or arm/hand swelling  or leg swelling, presyncope, palpitations, abdominal pain, anorexia, nausea, vomiting, diarrhea  or change in bowel habits or change in bladder habits, change in stools or change in urine, dysuria, hematuria,  rash, arthralgias, visual complaints, headache, numbness, weakness or ataxia or problems with walking or coordination,  change in mood or  memory.        No outpatient medications have been marked as taking for the 09/30/23 encounter (Appointment) with Caleb Cowden, MD.               Past Medical History:  Diagnosis Date   Acute exacerbation of chronic bronchitis (HCC)    Anemia    BPH (benign prostatic hypertrophy)    CAD (coronary artery disease)    Diabetes mellitus without complication (HCC)    Diverticulitis    Diverticulosis    GERD (gastroesophageal reflux disease)    GI bleed    History of tuberculosis    Hyperlipidemia    Hyperthyroidism        Objective:    Wts  09/30/2023      ***  07/01/2023       194  06/13/2023       187 05/16/2023       182  04/01/23 180 lb 6.4 oz (81.8 kg)   02/26/23 180 lb 12.8 oz (82 kg)  02/22/21 178 lb (80.7 kg)      Vital signs reviewed  09/30/2023  - Note at rest 02 sats  ***%  on ***   General appearance:    ***  Mod barrel  ***                       Assessment

## 2023-09-30 ENCOUNTER — Ambulatory Visit: Payer: Medicare PPO | Admitting: Internal Medicine

## 2023-09-30 DIAGNOSIS — H903 Sensorineural hearing loss, bilateral: Secondary | ICD-10-CM | POA: Insufficient documentation

## 2023-09-30 DIAGNOSIS — E119 Type 2 diabetes mellitus without complications: Secondary | ICD-10-CM | POA: Insufficient documentation

## 2023-09-30 DIAGNOSIS — I739 Peripheral vascular disease, unspecified: Secondary | ICD-10-CM | POA: Insufficient documentation

## 2023-09-30 DIAGNOSIS — A498 Other bacterial infections of unspecified site: Secondary | ICD-10-CM | POA: Insufficient documentation

## 2023-09-30 DIAGNOSIS — N400 Enlarged prostate without lower urinary tract symptoms: Secondary | ICD-10-CM | POA: Insufficient documentation

## 2023-09-30 DIAGNOSIS — R269 Unspecified abnormalities of gait and mobility: Secondary | ICD-10-CM | POA: Insufficient documentation

## 2023-09-30 DIAGNOSIS — F0390 Unspecified dementia without behavioral disturbance: Secondary | ICD-10-CM | POA: Insufficient documentation

## 2023-10-10 DIAGNOSIS — R531 Weakness: Secondary | ICD-10-CM | POA: Diagnosis not present

## 2023-10-10 DIAGNOSIS — E119 Type 2 diabetes mellitus without complications: Secondary | ICD-10-CM | POA: Diagnosis not present

## 2023-10-10 DIAGNOSIS — I4892 Unspecified atrial flutter: Secondary | ICD-10-CM | POA: Diagnosis not present

## 2023-10-10 DIAGNOSIS — J984 Other disorders of lung: Secondary | ICD-10-CM | POA: Diagnosis not present

## 2023-10-10 DIAGNOSIS — R188 Other ascites: Secondary | ICD-10-CM | POA: Diagnosis not present

## 2023-10-10 DIAGNOSIS — I517 Cardiomegaly: Secondary | ICD-10-CM | POA: Diagnosis not present

## 2023-10-10 DIAGNOSIS — R000 Tachycardia, unspecified: Secondary | ICD-10-CM | POA: Diagnosis not present

## 2023-10-10 DIAGNOSIS — I482 Chronic atrial fibrillation, unspecified: Secondary | ICD-10-CM | POA: Diagnosis not present

## 2023-10-10 DIAGNOSIS — E785 Hyperlipidemia, unspecified: Secondary | ICD-10-CM | POA: Diagnosis not present

## 2023-10-10 DIAGNOSIS — J449 Chronic obstructive pulmonary disease, unspecified: Secondary | ICD-10-CM | POA: Diagnosis not present

## 2023-10-10 DIAGNOSIS — I4891 Unspecified atrial fibrillation: Secondary | ICD-10-CM | POA: Diagnosis not present

## 2023-10-10 DIAGNOSIS — J9611 Chronic respiratory failure with hypoxia: Secondary | ICD-10-CM | POA: Diagnosis not present

## 2023-10-10 DIAGNOSIS — E039 Hypothyroidism, unspecified: Secondary | ICD-10-CM | POA: Diagnosis not present

## 2023-10-10 DIAGNOSIS — I10 Essential (primary) hypertension: Secondary | ICD-10-CM | POA: Diagnosis not present

## 2023-10-10 DIAGNOSIS — E1142 Type 2 diabetes mellitus with diabetic polyneuropathy: Secondary | ICD-10-CM | POA: Diagnosis not present

## 2023-10-10 DIAGNOSIS — R0989 Other specified symptoms and signs involving the circulatory and respiratory systems: Secondary | ICD-10-CM | POA: Diagnosis not present

## 2023-10-10 DIAGNOSIS — R140 Abdominal distension (gaseous): Secondary | ICD-10-CM | POA: Diagnosis not present

## 2023-10-10 DIAGNOSIS — N400 Enlarged prostate without lower urinary tract symptoms: Secondary | ICD-10-CM | POA: Diagnosis not present

## 2023-10-10 DIAGNOSIS — I4510 Unspecified right bundle-branch block: Secondary | ICD-10-CM | POA: Diagnosis not present

## 2023-10-10 DIAGNOSIS — R7989 Other specified abnormal findings of blood chemistry: Secondary | ICD-10-CM | POA: Diagnosis not present

## 2023-10-10 DIAGNOSIS — R9431 Abnormal electrocardiogram [ECG] [EKG]: Secondary | ICD-10-CM | POA: Diagnosis not present

## 2023-10-11 DIAGNOSIS — I517 Cardiomegaly: Secondary | ICD-10-CM | POA: Diagnosis not present

## 2023-10-11 DIAGNOSIS — R188 Other ascites: Secondary | ICD-10-CM | POA: Diagnosis not present

## 2023-10-11 DIAGNOSIS — I4891 Unspecified atrial fibrillation: Secondary | ICD-10-CM | POA: Diagnosis not present

## 2023-10-11 DIAGNOSIS — I358 Other nonrheumatic aortic valve disorders: Secondary | ICD-10-CM | POA: Diagnosis not present

## 2023-10-24 DIAGNOSIS — E119 Type 2 diabetes mellitus without complications: Secondary | ICD-10-CM | POA: Diagnosis not present

## 2023-10-24 DIAGNOSIS — E039 Hypothyroidism, unspecified: Secondary | ICD-10-CM | POA: Diagnosis not present

## 2023-10-24 DIAGNOSIS — J449 Chronic obstructive pulmonary disease, unspecified: Secondary | ICD-10-CM | POA: Diagnosis not present

## 2023-10-24 DIAGNOSIS — J9611 Chronic respiratory failure with hypoxia: Secondary | ICD-10-CM | POA: Diagnosis not present

## 2023-10-24 DIAGNOSIS — Z23 Encounter for immunization: Secondary | ICD-10-CM | POA: Diagnosis not present

## 2023-11-12 DIAGNOSIS — I10 Essential (primary) hypertension: Secondary | ICD-10-CM | POA: Diagnosis not present

## 2023-11-12 DIAGNOSIS — I4891 Unspecified atrial fibrillation: Secondary | ICD-10-CM | POA: Diagnosis not present

## 2023-11-12 DIAGNOSIS — Z1330 Encounter for screening examination for mental health and behavioral disorders, unspecified: Secondary | ICD-10-CM | POA: Diagnosis not present

## 2023-11-23 DIAGNOSIS — I10 Essential (primary) hypertension: Secondary | ICD-10-CM | POA: Diagnosis not present

## 2023-11-23 DIAGNOSIS — J449 Chronic obstructive pulmonary disease, unspecified: Secondary | ICD-10-CM | POA: Diagnosis not present

## 2024-03-02 ENCOUNTER — Other Ambulatory Visit (HOSPITAL_COMMUNITY): Payer: Self-pay | Admitting: Physician Assistant

## 2024-03-02 DIAGNOSIS — Z85118 Personal history of other malignant neoplasm of bronchus and lung: Secondary | ICD-10-CM

## 2024-04-14 ENCOUNTER — Ambulatory Visit (HOSPITAL_COMMUNITY)
Admission: RE | Admit: 2024-04-14 | Discharge: 2024-04-14 | Disposition: A | Discharge status: Home / Self Care | Source: Ambulatory Visit | Attending: Physician Assistant | Admitting: Physician Assistant

## 2024-04-14 DIAGNOSIS — Z85118 Personal history of other malignant neoplasm of bronchus and lung: Secondary | ICD-10-CM | POA: Insufficient documentation

## 2024-08-07 ENCOUNTER — Other Ambulatory Visit (HOSPITAL_COMMUNITY): Payer: Self-pay | Admitting: Occupational Therapy

## 2024-08-07 DIAGNOSIS — R1312 Dysphagia, oropharyngeal phase: Secondary | ICD-10-CM

## 2024-08-26 ENCOUNTER — Ambulatory Visit (HOSPITAL_COMMUNITY): Admitting: Speech Pathology

## 2024-08-31 ENCOUNTER — Encounter (HOSPITAL_COMMUNITY): Admitting: Speech Pathology

## 2024-08-31 ENCOUNTER — Other Ambulatory Visit (HOSPITAL_COMMUNITY)

## 2024-09-16 DEATH — deceased
